# Patient Record
Sex: Male | Born: 1940 | Race: White | Hispanic: No | Marital: Married | Smoking: Former smoker
Health system: Southern US, Community
[De-identification: ages and names within clinical notes are randomized; demographics above are authoritative.]

## PROBLEM LIST (undated history)

## (undated) DIAGNOSIS — Z87442 Personal history of urinary calculi: Secondary | ICD-10-CM

## (undated) DIAGNOSIS — N210 Calculus in bladder: Secondary | ICD-10-CM

## (undated) DIAGNOSIS — E785 Hyperlipidemia, unspecified: Secondary | ICD-10-CM

## (undated) DIAGNOSIS — N400 Enlarged prostate without lower urinary tract symptoms: Secondary | ICD-10-CM

## (undated) DIAGNOSIS — R918 Other nonspecific abnormal finding of lung field: Secondary | ICD-10-CM

## (undated) DIAGNOSIS — N200 Calculus of kidney: Secondary | ICD-10-CM

## (undated) DIAGNOSIS — Z973 Presence of spectacles and contact lenses: Secondary | ICD-10-CM

## (undated) DIAGNOSIS — Z77090 Contact with and (suspected) exposure to asbestos: Secondary | ICD-10-CM

## (undated) DIAGNOSIS — K219 Gastro-esophageal reflux disease without esophagitis: Secondary | ICD-10-CM

## (undated) DIAGNOSIS — I10 Essential (primary) hypertension: Secondary | ICD-10-CM

## (undated) DIAGNOSIS — E119 Type 2 diabetes mellitus without complications: Secondary | ICD-10-CM

## (undated) DIAGNOSIS — Z972 Presence of dental prosthetic device (complete) (partial): Secondary | ICD-10-CM

## (undated) DIAGNOSIS — R350 Frequency of micturition: Secondary | ICD-10-CM

## (undated) DIAGNOSIS — M1990 Unspecified osteoarthritis, unspecified site: Secondary | ICD-10-CM

## (undated) HISTORY — PX: CATARACT EXTRACTION W/ INTRAOCULAR LENS  IMPLANT, BILATERAL: SHX1307

## (undated) HISTORY — DX: Hyperlipidemia, unspecified: E78.5

## (undated) HISTORY — PX: TOTAL HIP ARTHROPLASTY: SHX124

## (undated) HISTORY — DX: Benign prostatic hyperplasia without lower urinary tract symptoms: N40.0

## (undated) HISTORY — DX: Gastro-esophageal reflux disease without esophagitis: K21.9

---

## 1999-06-06 ENCOUNTER — Encounter: Payer: Self-pay | Admitting: Orthopedic Surgery

## 1999-06-06 ENCOUNTER — Ambulatory Visit (HOSPITAL_COMMUNITY): Admission: RE | Admit: 1999-06-06 | Discharge: 1999-06-06 | Payer: Self-pay | Admitting: Orthopedic Surgery

## 1999-08-12 ENCOUNTER — Encounter: Payer: Self-pay | Admitting: Orthopedic Surgery

## 1999-08-12 ENCOUNTER — Ambulatory Visit (HOSPITAL_COMMUNITY): Admission: RE | Admit: 1999-08-12 | Discharge: 1999-08-12 | Payer: Self-pay | Admitting: Orthopedic Surgery

## 1999-08-26 ENCOUNTER — Encounter: Payer: Self-pay | Admitting: Orthopedic Surgery

## 1999-09-01 ENCOUNTER — Inpatient Hospital Stay (HOSPITAL_COMMUNITY): Admission: RE | Admit: 1999-09-01 | Discharge: 1999-09-06 | Payer: Self-pay | Admitting: Orthopedic Surgery

## 1999-09-01 ENCOUNTER — Encounter: Payer: Self-pay | Admitting: Orthopedic Surgery

## 2002-10-30 ENCOUNTER — Ambulatory Visit (HOSPITAL_COMMUNITY): Admission: RE | Admit: 2002-10-30 | Discharge: 2002-10-30 | Payer: Self-pay | Admitting: Gastroenterology

## 2003-03-24 ENCOUNTER — Encounter: Payer: Self-pay | Admitting: Internal Medicine

## 2003-03-24 ENCOUNTER — Encounter: Admission: RE | Admit: 2003-03-24 | Discharge: 2003-03-24 | Payer: Self-pay | Admitting: Internal Medicine

## 2003-11-11 ENCOUNTER — Ambulatory Visit (HOSPITAL_COMMUNITY): Admission: RE | Admit: 2003-11-11 | Discharge: 2003-11-11 | Payer: Self-pay | Admitting: Neurology

## 2003-11-14 HISTORY — PX: WRIST SURGERY: SHX841

## 2004-03-28 ENCOUNTER — Inpatient Hospital Stay (HOSPITAL_COMMUNITY): Admission: RE | Admit: 2004-03-28 | Discharge: 2004-03-31 | Payer: Self-pay | Admitting: Orthopedic Surgery

## 2004-04-23 ENCOUNTER — Emergency Department (HOSPITAL_COMMUNITY): Admission: EM | Admit: 2004-04-23 | Discharge: 2004-04-23 | Payer: Self-pay | Admitting: Family Medicine

## 2004-04-24 ENCOUNTER — Emergency Department (HOSPITAL_COMMUNITY): Admission: EM | Admit: 2004-04-24 | Discharge: 2004-04-24 | Payer: Self-pay | Admitting: Emergency Medicine

## 2004-10-06 ENCOUNTER — Inpatient Hospital Stay (HOSPITAL_COMMUNITY): Admission: EM | Admit: 2004-10-06 | Discharge: 2004-10-08 | Payer: Self-pay | Admitting: Emergency Medicine

## 2011-09-20 ENCOUNTER — Encounter: Payer: Self-pay | Admitting: Emergency Medicine

## 2011-09-20 ENCOUNTER — Emergency Department (HOSPITAL_COMMUNITY)
Admission: EM | Admit: 2011-09-20 | Discharge: 2011-09-21 | Disposition: A | Discharge status: Home / Self Care | Payer: BC Managed Care – PPO | Attending: Emergency Medicine | Admitting: Emergency Medicine

## 2011-09-20 ENCOUNTER — Emergency Department (HOSPITAL_COMMUNITY): Payer: BC Managed Care – PPO

## 2011-09-20 ENCOUNTER — Other Ambulatory Visit: Payer: Self-pay

## 2011-09-20 DIAGNOSIS — R599 Enlarged lymph nodes, unspecified: Secondary | ICD-10-CM | POA: Insufficient documentation

## 2011-09-20 DIAGNOSIS — IMO0001 Reserved for inherently not codable concepts without codable children: Secondary | ICD-10-CM | POA: Insufficient documentation

## 2011-09-20 DIAGNOSIS — R0602 Shortness of breath: Secondary | ICD-10-CM | POA: Insufficient documentation

## 2011-09-20 DIAGNOSIS — J40 Bronchitis, not specified as acute or chronic: Secondary | ICD-10-CM

## 2011-09-20 DIAGNOSIS — R109 Unspecified abdominal pain: Secondary | ICD-10-CM | POA: Insufficient documentation

## 2011-09-20 DIAGNOSIS — M549 Dorsalgia, unspecified: Secondary | ICD-10-CM | POA: Insufficient documentation

## 2011-09-20 DIAGNOSIS — R918 Other nonspecific abnormal finding of lung field: Secondary | ICD-10-CM

## 2011-09-20 DIAGNOSIS — R590 Localized enlarged lymph nodes: Secondary | ICD-10-CM

## 2011-09-20 DIAGNOSIS — R079 Chest pain, unspecified: Secondary | ICD-10-CM | POA: Insufficient documentation

## 2011-09-20 DIAGNOSIS — M2550 Pain in unspecified joint: Secondary | ICD-10-CM | POA: Insufficient documentation

## 2011-09-20 DIAGNOSIS — R300 Dysuria: Secondary | ICD-10-CM | POA: Insufficient documentation

## 2011-09-20 DIAGNOSIS — R509 Fever, unspecified: Secondary | ICD-10-CM | POA: Insufficient documentation

## 2011-09-20 LAB — BASIC METABOLIC PANEL
BUN: 22 mg/dL (ref 6–23)
CO2: 25 mEq/L (ref 19–32)
Calcium: 9.3 mg/dL (ref 8.4–10.5)
Chloride: 101 mEq/L (ref 96–112)
Creatinine, Ser: 0.84 mg/dL (ref 0.50–1.35)
GFR calc Af Amer: >90 mL/min (ref >90)
GFR calc non Af Amer: 86 mL/min — ABNORMAL LOW (ref >90)
Glucose, Bld: 154 mg/dL — ABNORMAL HIGH (ref 70–99)
Potassium: 4.1 mEq/L (ref 3.5–5.1)
Sodium: 135 mEq/L (ref 135–145)

## 2011-09-20 LAB — CBC
HCT: 37.5 % — ABNORMAL LOW (ref 39.0–52.0)
Hemoglobin: 12 g/dL — ABNORMAL LOW (ref 13.0–17.0)
MCH: 27 pg (ref 26.0–34.0)
MCHC: 32 g/dL (ref 30.0–36.0)
MCV: 84.3 fL (ref 78.0–100.0)
Platelets: 161 10*3/uL (ref 150–400)
RBC: 4.45 MIL/uL (ref 4.22–5.81)
RDW: 13.2 % (ref 11.5–15.5)
WBC: 9.2 10*3/uL (ref 4.0–10.5)

## 2011-09-20 LAB — POCT I-STAT TROPONIN I: Troponin i, poc: 0 ng/mL (ref 0.00–0.08)

## 2011-09-20 MED ORDER — ACETAMINOPHEN 325 MG PO TABS
ORAL_TABLET | ORAL | Status: AC
  Administered 2011-09-20: 975 mg
  Filled 2011-09-20: qty 3

## 2011-09-20 NOTE — ED Notes (Signed)
PT. REPORTS GENERALIZED BODY ACHES AND PAIN WITH CHEST PAIN ONSET LAST Sunday  WITH SOB , NO COUGH  , UPPER BACK AND SHOULDER.

## 2011-09-21 ENCOUNTER — Emergency Department (HOSPITAL_COMMUNITY): Payer: BC Managed Care – PPO

## 2011-09-21 ENCOUNTER — Encounter (HOSPITAL_COMMUNITY): Payer: Self-pay | Admitting: Emergency Medicine

## 2011-09-21 LAB — URINE CULTURE
Colony Count: 5000
Culture  Setup Time: 201211080227

## 2011-09-21 LAB — URINALYSIS, ROUTINE W REFLEX MICROSCOPIC
Glucose, UA: 100 mg/dL — AB
Hgb urine dipstick: NEGATIVE
Ketones, ur: NEGATIVE mg/dL
Leukocytes, UA: NEGATIVE
Nitrite: NEGATIVE
Protein, ur: NEGATIVE mg/dL
Specific Gravity, Urine: 1.029 (ref 1.005–1.030)
Urobilinogen, UA: 1 mg/dL (ref 0.0–1.0)
pH: 6 (ref 5.0–8.0)

## 2011-09-21 LAB — LACTIC ACID, PLASMA: Lactic Acid, Venous: 1.1 mmol/L (ref 0.5–2.2)

## 2011-09-21 LAB — POCT I-STAT TROPONIN I: Troponin i, poc: 0 ng/mL (ref 0.00–0.08)

## 2011-09-21 MED ORDER — ALBUTEROL SULFATE HFA 108 (90 BASE) MCG/ACT IN AERS: 1.0000 | INHALATION_SPRAY | Freq: Four times a day (QID) | RESPIRATORY_TRACT | Status: DC | PRN

## 2011-09-21 MED ORDER — FENTANYL CITRATE 0.05 MG/ML IJ SOLN
50.0000 ug | Freq: Once | INTRAMUSCULAR | Status: AC
  Administered 2011-09-21: 50 ug via INTRAVENOUS
  Filled 2011-09-21: qty 2

## 2011-09-21 MED ORDER — KETOROLAC TROMETHAMINE 30 MG/ML IJ SOLN
30.0000 mg | Freq: Once | INTRAMUSCULAR | Status: AC
  Administered 2011-09-21: 30 mg via INTRAVENOUS
  Filled 2011-09-21: qty 1

## 2011-09-21 MED ORDER — ALBUTEROL SULFATE (5 MG/ML) 0.5% IN NEBU
5.0000 mg | INHALATION_SOLUTION | Freq: Once | RESPIRATORY_TRACT | Status: AC
  Administered 2011-09-21: 5 mg via RESPIRATORY_TRACT
  Filled 2011-09-21: qty 1

## 2011-09-21 MED ORDER — IOHEXOL 300 MG/ML  SOLN
100.0000 mL | Freq: Once | INTRAMUSCULAR | Status: AC | PRN
  Administered 2011-09-21: 100 mL via INTRAVENOUS

## 2011-09-21 MED ORDER — AMOXICILLIN-POT CLAVULANATE 875-125 MG PO TABS: 1.0000 | ORAL_TABLET | Freq: Two times a day (BID) | ORAL | Status: AC

## 2011-09-21 NOTE — ED Provider Notes (Signed)
History     CSN: [id number] Arrival date & time: 09/20/2011  8:24 PM   First MD Initiated Contact with Patient 09/21/11 0002      Chief Complaint  Patient presents with  . Chest Pain    (Consider location/radiation/quality/duration/timing/severity/associated sxs/prior treatment) Patient is a 70 y.o. male presenting with chest pain. The history is provided by the patient. No language interpreter was used.  Chest Pain The chest pain began 3 - 5 days ago. Duration of episode(s) is 4 days. Chest pain occurs constantly. The chest pain is unchanged. Associated with: nothing,  He has pain in entire body. At its most intense, the pain is at 9/10. The pain is currently at 9/10. The quality of the pain is described as sharp. Radiates to: entire body. Exacerbated by: nothinh. Primary symptoms include a fever, shortness of breath and abdominal pain. Pertinent negatives for primary symptoms include no wheezing, no palpitations, no nausea, no vomiting, no dizziness and no altered mental status. Primary symptoms comment: global weakness  Pertinent negatives for associated symptoms include no numbness.     No past medical history on file.  Past Surgical History  Procedure Date  . Hip resection arthroplasty     No family history on file.  History  Substance Use Topics  . Smoking status: Former Games developer  . Smokeless tobacco: Not on file  . Alcohol Use: No      Review of Systems  Constitutional: Positive for fever and chills. Negative for appetite change.  HENT: Negative for facial swelling and neck stiffness.   Eyes: Negative for discharge.  Respiratory: Positive for shortness of breath. Negative for wheezing.   Cardiovascular: Positive for chest pain. Negative for palpitations.  Gastrointestinal: Positive for abdominal pain. Negative for nausea and vomiting.  Genitourinary: Positive for dysuria.  Musculoskeletal: Positive for myalgias, back pain and arthralgias. Negative for joint  swelling.  Neurological: Negative for dizziness, syncope and numbness.  Hematological: Negative.  Negative for adenopathy.  Psychiatric/Behavioral: Negative.  Negative for altered mental status.    Allergies  Prednisone  Home Medications   Current Outpatient Rx  Name Route Sig Dispense Refill  . IBUPROFEN 600 MG PO TABS Oral Take 600 mg by mouth every 6 (six) hours as needed. pain       BP 134/75  Pulse 108  Temp(Src) 102.6 F (39.2 C) (Rectal)  Resp 16  SpO2 100%  Physical Exam  Constitutional: He is oriented to person, place, and time. He appears well-developed and well-nourished. No distress.  HENT:  Head: Normocephalic and atraumatic.  Right Ear: External ear normal.  Left Ear: External ear normal.  Mouth/Throat: Oropharynx is clear and moist. No oropharyngeal exudate.  Eyes: EOM are normal. Pupils are equal, round, and reactive to light. Right eye exhibits no discharge. Left eye exhibits no discharge.  Neck: Normal range of motion. Neck supple. No tracheal deviation present. No thyromegaly present.  Cardiovascular: Normal rate and regular rhythm.   No murmur heard. Pulmonary/Chest: Effort normal and breath sounds normal. No respiratory distress.  Abdominal: Soft. Bowel sounds are normal. There is no tenderness. There is no guarding.  Musculoskeletal: Normal range of motion.  Lymphadenopathy:    He has no cervical adenopathy.  Neurological: He is alert and oriented to person, place, and time.  Skin: Skin is warm and dry.  Psychiatric: He has a normal mood and affect.    ED Course  Procedures (including critical care time)  Labs Reviewed  CBC - Abnormal; Notable for the following:  Hemoglobin 12.0 (*)    HCT 37.5 (*)    All other components within normal limits  BASIC METABOLIC PANEL - Abnormal; Notable for the following:    Glucose, Bld 154 (*)    GFR calc non Af Amer 86 (*)    All other components within normal limits  POCT I-STAT TROPONIN I  POCT  CARDIAC MARKERS  URINALYSIS, ROUTINE W REFLEX MICROSCOPIC  I-STAT TROPONIN I  URINE CULTURE  URINALYSIS, ROUTINE W REFLEX MICROSCOPIC  LACTIC ACID, PLASMA   Dg Chest 2 View  09/20/2011  *RADIOLOGY REPORT*  Clinical Data: Chest pain.  CHEST - 2 VIEW  Comparison: Chest x-ray 10/06/2004.  Findings: The cardiac silhouette, mediastinal and hilar contours are within normal limits and stable.  The lungs demonstrate chronic bronchitic type changes but no acute pulmonary findings.  No pleural effusion. Pleural calcifications are noted and may be due to asbestos related pleural disease.  Moderate degenerative changes noted throughout the thoracic spine.  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  Original Report Authenticated By: P. Loralie Champagne, M.D.     No diagnosis found.    MDM   Date: 09/21/2011  Rate: 100  Rhythm: sinus tachycardia  QRS Axis: normal  Intervals: normal  ST/T Wave abnormalities: nonspecific ST changes  Conduction Disutrbances:none  Narrative Interpretation:   Old EKG Reviewed: none available       Case d/w Dr. Evlyn Kanner, who took the patients information.  Dr. Evlyn Kanner informed of CT results and will arrange close follow up in the office for further diagnosis and treatment of mediastinal lymphadenopathy pulmonary plaques and pulmonary nodules.    Patient informed of mediastinal lymphadenopathy and concern for potential lymphoma and pulmonary plaques consistent with asbestosis.  Patient states he used to work with asbestos in [employer].  Patient has follow up with Dr. Wylene Simmer at 11 am today and will inform him of findings and seek further care.  Feeling improved.    Jasmine Awe, MD 09/21/11 [phone]

## 2011-10-11 ENCOUNTER — Ambulatory Visit (INDEPENDENT_AMBULATORY_CARE_PROVIDER_SITE_OTHER): Payer: BC Managed Care – PPO | Admitting: Critical Care Medicine

## 2011-10-11 ENCOUNTER — Encounter: Payer: Self-pay | Admitting: *Deleted

## 2011-10-11 DIAGNOSIS — J189 Pneumonia, unspecified organism: Secondary | ICD-10-CM

## 2011-10-11 DIAGNOSIS — E119 Type 2 diabetes mellitus without complications: Secondary | ICD-10-CM | POA: Insufficient documentation

## 2011-10-11 DIAGNOSIS — N400 Enlarged prostate without lower urinary tract symptoms: Secondary | ICD-10-CM | POA: Insufficient documentation

## 2011-10-11 DIAGNOSIS — E669 Obesity, unspecified: Secondary | ICD-10-CM

## 2011-10-11 DIAGNOSIS — R918 Other nonspecific abnormal finding of lung field: Secondary | ICD-10-CM

## 2011-10-11 DIAGNOSIS — K219 Gastro-esophageal reflux disease without esophagitis: Secondary | ICD-10-CM

## 2011-10-11 DIAGNOSIS — E785 Hyperlipidemia, unspecified: Secondary | ICD-10-CM

## 2011-10-11 MED ORDER — AZITHROMYCIN 250 MG PO TABS: 250.0000 mg | ORAL_TABLET | Freq: Every day | ORAL | Status: AC

## 2011-10-11 NOTE — Patient Instructions (Signed)
Take azithromycin 250mg  Take two once then one daily until gone CT Chest will be obtained in two weeks then see Dr Delford Field again

## 2011-10-11 NOTE — Progress Notes (Signed)
Subjective:    Patient ID: Kevin Suarez, male    DOB: 09/25/1941, 70 y.o.   MRN: [id number]  HPI Hx of chest pain and pressure on the right side. Started suddenly on 09/20/11.  ? Flu vaccine related. Noted some fever, low grade 100.2  No real cough. Noted more dyspnea.  Hurt all over like an ache. Joints hurt.  No sinus or nasal issues then.  Rx augmentin x 7days  and Zpak. Now off > one week.   As pain in body slowly better, pain in chest was pushing, hurt through to the back.  Now pain is pressure , no change with deep breath.  No more fever.  Now is ok with dyspnea.  No real exposures in past.  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  No other exposure to asbestos since.  Worked in sheet metal in the past, ran pipe for house HVAC systems.  SMoked in the past,  Quit in 2000. No prior hx of PNA.    Past Medical History  Diagnosis Date  . Hyperlipidemia   . GERD (gastroesophageal reflux disease)   . Obesity   . BPH (benign prostatic hypertrophy)   . Diabetes mellitus      Family History  Problem Relation Age of Onset  . Cancer Mother   . Cancer Maternal Grandmother   . Heart attack Paternal Grandfather   . Coronary artery disease Father   . Diabetes Father      History   Social History  . Marital Status: Married    Spouse Name: N/A    Number of Children: N/A  . Years of Education: N/A   Occupational History  . plumber    Social History Main Topics  . Smoking status: Former Smoker -- 2.0 packs/day for 30 years    Types: Cigarettes    Quit date: 11/14/1995  . Smokeless tobacco: Not on file  . Alcohol Use: No  . Drug Use: No  . Sexually Active: Not on file   Other Topics Concern  . Not on file   Social History Narrative  . No narrative on file     Allergies  Allergen Reactions  . Prednisone     Doesn't like side effects of prednisone.      Outpatient Prescriptions Prior to Visit  Medication Sig Dispense Refill  . albuterol (PROVENTIL  HFA;VENTOLIN HFA) 108 (90 BASE) MCG/ACT inhaler Inhale 1-2 puffs into the lungs every 6 (six) hours as needed for wheezing.  1 Inhaler  0  . ibuprofen (ADVIL,MOTRIN) 600 MG tablet Take 600 mg by mouth every 6 (six) hours as needed. pain           Review of Systems  Constitutional: Negative for fever and unexpected weight change.  HENT: Negative for ear pain, nosebleeds, congestion, sore throat, rhinorrhea, sneezing, trouble swallowing, dental problem, postnasal drip and sinus pressure.   Eyes: Negative for redness and itching.  Respiratory: Positive for shortness of breath. Negative for cough, chest tightness and wheezing.   Cardiovascular: Positive for chest pain. Negative for palpitations and leg swelling.  Gastrointestinal: Negative for nausea and vomiting.  Genitourinary: Negative for dysuria.  Musculoskeletal: Negative for joint swelling.  Skin: Negative for rash.  Neurological: Positive for headaches.  Hematological: Does not bruise/bleed easily.  Psychiatric/Behavioral: Negative for dysphoric mood. The patient is not nervous/anxious.        Objective:   Physical Exam Filed Vitals:   10/11/11 1113  BP: 122/68  Pulse: 69  Temp: 98.1 F (36.7 C)  TempSrc: Oral  Height: 5\' 10"  (1.778 m)  Weight: 249 lb 6.4 oz (113.127 kg)  SpO2: 98%    Gen: Pleasant, well-nourished, in no distress,  normal affect  ENT: No lesions,  mouth clear,  oropharynx clear, no postnasal drip  Neck: No JVD, no TMG, no carotid bruits  Lungs: No use of accessory muscles, no dullness to percussion, clear without rales or rhonchi  Cardiovascular: RRR, heart sounds normal, no murmur or gallops, no peripheral edema  Abdomen: soft and NT, no HSM,  BS normal  Musculoskeletal: No deformities, no cyanosis or clubbing  Neuro: alert, non focal  Skin: Warm, no lesions or rashes    CT Chest 09/21/11: Findings: No pulmonary arterial filling defect identified. Normal  caliber aorta with scattered  atherosclerotic calcification. Normal  heart size. Coronary artery calcification. No pleural or  pericardial effusion. There are calcified pleural plaques. There  is extensive mediastinal and hilar lymphadenopathy. As index, a  prevascular node measures 1.7 cm short axis on series 2 image 73.  A right infrahilar lymph node measures 1.2 cm short axis on image  114. There is a nodule with punctate calcification measuring 1.3  cm on series 4 image 56. A 5 mm nodule on image 54 is present. A  noncalcified 11 mm nodule is present on the right on image 49.  Limited images through the upper abdomen demonstrate mildly  prominent porta hepatis lymph nodes however no acute abnormality.  Multilevel degenerative changes of the imaged spine. No acute or  aggressive appearing osseous lesion. There is compression  deformity of the T8 vertebral body that does not appear acute.  Review of the MIP images confirms the above findings.  IMPRESSION:  No pulmonary embolism or aortic dissection.  Pleural plaques suggest prior asbestos exposure. There is  extensive mediastinal lymphadenopathy. While possibly reactive,  malignancy/lymphoma are the diagnoses of exclusion and tissue  sample should be considered.  There are at least three lung nodules on the right which are  nonspecific. Attention with short-term follow-up recommended (3  months).         Assessment & Plan:   Lung nodules Multiple R lung nodules likely benign or inflammatory, doubt CA Plan Rx Azithromycin x 5days F/u CT Chest in 3 months     Updated Medication List Outpatient Encounter Prescriptions as of 10/11/2011  Medication Sig Dispense Refill  . albuterol (PROVENTIL HFA;VENTOLIN HFA) 108 (90 BASE) MCG/ACT inhaler Inhale 1-2 puffs into the lungs every 6 (six) hours as needed for wheezing.  1 Inhaler  0  . ibuprofen (ADVIL,MOTRIN) 600 MG tablet Take 600 mg by mouth every 6 (six) hours as needed. pain       . azithromycin  (ZITHROMAX) 250 MG tablet Take 1 tablet (250 mg total) by mouth daily. Take two once then one daily until gone  6 each  0

## 2011-10-12 NOTE — Assessment & Plan Note (Addendum)
Multiple R lung nodules likely benign or inflammatory, doubt CA Plan Rx Azithromycin x 5days F/u CT Chest in 3 months

## 2011-10-24 ENCOUNTER — Ambulatory Visit: Payer: BC Managed Care – PPO | Admitting: Critical Care Medicine

## 2011-10-25 ENCOUNTER — Ambulatory Visit (INDEPENDENT_AMBULATORY_CARE_PROVIDER_SITE_OTHER)
Admission: RE | Admit: 2011-10-25 | Discharge: 2011-10-25 | Disposition: A | Discharge status: Home / Self Care | Payer: BC Managed Care – PPO | Source: Ambulatory Visit | Attending: Critical Care Medicine | Admitting: Critical Care Medicine

## 2011-10-25 ENCOUNTER — Ambulatory Visit (INDEPENDENT_AMBULATORY_CARE_PROVIDER_SITE_OTHER): Payer: BC Managed Care – PPO | Admitting: Critical Care Medicine

## 2011-10-25 ENCOUNTER — Encounter: Payer: Self-pay | Admitting: Critical Care Medicine

## 2011-10-25 VITALS — BP 110/66 | HR 76 | Temp 98.0°F | Ht 70.0 in | Wt 252.0 lb

## 2011-10-25 DIAGNOSIS — R918 Other nonspecific abnormal finding of lung field: Secondary | ICD-10-CM

## 2011-10-25 DIAGNOSIS — J189 Pneumonia, unspecified organism: Secondary | ICD-10-CM

## 2011-10-25 NOTE — Assessment & Plan Note (Signed)
Benign lung nodules, no further w/u or scans needed Plan Return prn

## 2011-10-25 NOTE — Progress Notes (Signed)
Subjective:    Patient ID: Kevin Suarez, male    DOB: October 04, 1941, 70 y.o.   MRN: [id number]  HPI  Hx of chest pain and pressure on the right side. Started suddenly on 09/20/11.  ? Flu vaccine related. Noted some fever, low grade 100.2  No real cough. Noted more dyspnea.  Hurt all over like an ache. Joints hurt.  No sinus or nasal issues then.  Rx augmentin x 7days  and Zpak. Now off > one week.   As pain in body slowly better, pain in chest was pushing, hurt through to the back.  Now pain is pressure , no change with deep breath.  No more fever.  Now is ok with dyspnea.  No real exposures in past.  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  No other exposure to asbestos since.  Worked in sheet metal in the past, ran pipe for house HVAC systems.  SMoked in the past,  Quit in 2000. No prior hx of PNA.    10/25/2011  F/u chest pain syndrome and abn CT scan .  Neither related. 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    Chest pain is less.  No new issues.   Past Medical History  Diagnosis Date  . Hyperlipidemia   . GERD (gastroesophageal reflux disease)   . Obesity   . BPH (benign prostatic hypertrophy)   . Diabetes mellitus      Family History  Problem Relation Age of Onset  . Cancer Mother   . Cancer Maternal Grandmother   . Heart attack Paternal Grandfather   . Coronary artery disease Father   . Diabetes Father      History   Social History  . Marital Status: Married    Spouse Name: N/A    Number of Children: N/A  . Years of Education: N/A   Occupational History  . plumber    Social History Main Topics  . Smoking status: Former Smoker -- 2.0 packs/day for 30 years    Types: Cigarettes    Quit date: 11/14/1995  . Smokeless tobacco: Never Used  . Alcohol Use: No  . Drug Use: No  . Sexually Active: Not on file   Other Topics Concern  . Not on file   Social History Narrative  . No narrative on file     Allergies  Allergen  Reactions  . Prednisone     Doesn't like side effects of prednisone.      Outpatient Prescriptions Prior to Visit  Medication Sig Dispense Refill  . albuterol (PROVENTIL HFA;VENTOLIN HFA) 108 (90 BASE) MCG/ACT inhaler Inhale 1-2 puffs into the lungs every 6 (six) hours as needed for wheezing.  1 Inhaler  0  . ibuprofen (ADVIL,MOTRIN) 600 MG tablet Take 600 mg by mouth every 6 (six) hours as needed. pain           Review of Systems  Constitutional: Negative for fever and unexpected weight change.  HENT: Negative for ear pain, nosebleeds, congestion, sore throat, rhinorrhea, sneezing, trouble swallowing, dental problem, postnasal drip and sinus pressure.   Eyes: Negative for redness and itching.  Respiratory: Positive for shortness of breath. Negative for cough, chest tightness and wheezing.   Cardiovascular: Positive for chest pain. Negative for palpitations and leg swelling.  Gastrointestinal: Negative for nausea and vomiting.  Genitourinary: Negative for dysuria.  Musculoskeletal: Negative for joint swelling.  Skin: Negative for rash.  Neurological: Positive for headaches.  Hematological:  Does not bruise/bleed easily.  Psychiatric/Behavioral: Negative for dysphoric mood. The patient is not nervous/anxious.        Objective:   Physical Exam  Filed Vitals:   10/25/11 1528  BP: 110/66  Pulse: 76  Temp: 98 F (36.7 C)  TempSrc: Oral  Height: 5\' 10"  (1.778 m)  Weight: 252 lb (114.306 kg)  SpO2: 95%    Gen: Pleasant, well-nourished, in no distress,  normal affect  ENT: No lesions,  mouth clear,  oropharynx clear, no postnasal drip  Neck: No JVD, no TMG, no carotid bruits  Lungs: No use of accessory muscles, no dullness to percussion, clear without rales or rhonchi  Cardiovascular: RRR, heart sounds normal, no murmur or gallops, no peripheral edema  Abdomen: soft and NT, no HSM,  BS normal  Musculoskeletal: No deformities, no cyanosis or clubbing  Neuro: alert, non  focal  Skin: Warm, no lesions or rashes  Ct Chest Wo Contrast  10/25/2011  *RADIOLOGY REPORT*  Clinical Data: Follow-up pulmonary nodules.  Right chest pain radiating to the shoulder blades.  CT CHEST WITHOUT CONTRAST  Technique:  Multidetector CT imaging of the chest was performed following the standard protocol without IV contrast.  Comparison: 09/21/2011.  Findings: Mediastinal lymph nodes have decreased in size in the interval.  Index low right paratracheal lymph node measures 10 mm (previously 13 mm).  Hilar regions are difficult to definitively evaluate without IV contrast.  No axillary adenopathy.  Coronary artery calcification.  Heart size normal.  No pericardial effusion.  There are calcified pleural plaques bilaterally.  A calcified nodular plaque is seen along the right major fissure, simulating a nodule on the prior study.  There are a few scattered tiny pulmonary nodules, measuring 4 mm less in size.  No pleural fluid. Airway is unremarkable.  Incidental imaging of the upper abdomen shows no acute findings. Degenerative changes are seen in the spine.  No worrisome lytic or sclerotic lesions.  Mild anterior wedging of a mid thoracic vertebral body is unchanged.  IMPRESSION:  1.  Scattered tiny pulmonary nodules measure 4 mm less in size. If the patient is at high risk for bronchogenic carcinoma, follow-up chest CT at 1 year is recommended.  If the patient is at low risk, no follow-up is needed.  This recommendation follows the consensus statement: Guidelines for Management of Small Pulmonary Nodules Detected on CT Scans:  A Statement from the Fleischner Society as published in Radiology 2005; 237:395-400.  Available online at: DietDisorder.cz. 2.  Asbestos-related pleural disease.  Original Report Authenticated By: Reyes Ivan, M.D.       Assessment & Plan:   Lung nodules Benign lung nodules, no further w/u or scans needed Plan Return  prn    Lung nodule benign Suspect prior asbestos exposure with pleural calcified plaques Mediastinal LAN improved   Updated Medication List Outpatient Encounter Prescriptions as of 10/25/2011  Medication Sig Dispense Refill  . albuterol (PROVENTIL HFA;VENTOLIN HFA) 108 (90 BASE) MCG/ACT inhaler Inhale 1-2 puffs into the lungs every 6 (six) hours as needed for wheezing.  1 Inhaler  0  . ibuprofen (ADVIL,MOTRIN) 600 MG tablet Take 600 mg by mouth every 6 (six) hours as needed. pain

## 2011-10-25 NOTE — Patient Instructions (Signed)
No change in medications. Return as needed 

## 2011-12-28 ENCOUNTER — Other Ambulatory Visit: Payer: Self-pay | Admitting: Cardiology

## 2011-12-28 DIAGNOSIS — I739 Peripheral vascular disease, unspecified: Secondary | ICD-10-CM

## 2012-01-02 ENCOUNTER — Encounter (INDEPENDENT_AMBULATORY_CARE_PROVIDER_SITE_OTHER): Payer: BC Managed Care – PPO

## 2012-01-02 DIAGNOSIS — I739 Peripheral vascular disease, unspecified: Secondary | ICD-10-CM

## 2012-01-02 DIAGNOSIS — E1159 Type 2 diabetes mellitus with other circulatory complications: Secondary | ICD-10-CM

## 2013-11-24 ENCOUNTER — Other Ambulatory Visit: Payer: Self-pay | Admitting: Urology

## 2013-11-24 ENCOUNTER — Encounter (HOSPITAL_BASED_OUTPATIENT_CLINIC_OR_DEPARTMENT_OTHER): Payer: Self-pay | Admitting: *Deleted

## 2013-11-24 NOTE — Progress Notes (Signed)
NPO AFTER MN. ARRIVE AT 0715. NEEDS ISTAT AND EKG. WILL TAKE LOSARTAN AM DOS W/ SIP OF WATER.

## 2013-11-28 ENCOUNTER — Encounter (HOSPITAL_BASED_OUTPATIENT_CLINIC_OR_DEPARTMENT_OTHER): Payer: Self-pay | Admitting: Anesthesiology

## 2013-11-28 ENCOUNTER — Encounter (HOSPITAL_BASED_OUTPATIENT_CLINIC_OR_DEPARTMENT_OTHER)
Admission: RE | Disposition: A | Discharge status: Home / Self Care | Payer: Self-pay | Source: Ambulatory Visit | Attending: Urology

## 2013-11-28 ENCOUNTER — Encounter (HOSPITAL_BASED_OUTPATIENT_CLINIC_OR_DEPARTMENT_OTHER): Payer: Medicare Other | Admitting: Anesthesiology

## 2013-11-28 ENCOUNTER — Ambulatory Visit (HOSPITAL_BASED_OUTPATIENT_CLINIC_OR_DEPARTMENT_OTHER)
Admission: RE | Admit: 2013-11-28 | Discharge: 2013-11-28 | Disposition: A | Discharge status: Home / Self Care | Payer: Medicare Other | Source: Ambulatory Visit | Attending: Urology | Admitting: Urology

## 2013-11-28 ENCOUNTER — Ambulatory Visit (HOSPITAL_BASED_OUTPATIENT_CLINIC_OR_DEPARTMENT_OTHER): Payer: Medicare Other | Admitting: Anesthesiology

## 2013-11-28 DIAGNOSIS — Z87442 Personal history of urinary calculi: Secondary | ICD-10-CM | POA: Insufficient documentation

## 2013-11-28 DIAGNOSIS — R310 Gross hematuria: Secondary | ICD-10-CM | POA: Insufficient documentation

## 2013-11-28 DIAGNOSIS — N401 Enlarged prostate with lower urinary tract symptoms: Secondary | ICD-10-CM | POA: Insufficient documentation

## 2013-11-28 DIAGNOSIS — N400 Enlarged prostate without lower urinary tract symptoms: Secondary | ICD-10-CM

## 2013-11-28 DIAGNOSIS — Z8701 Personal history of pneumonia (recurrent): Secondary | ICD-10-CM | POA: Insufficient documentation

## 2013-11-28 DIAGNOSIS — Z96649 Presence of unspecified artificial hip joint: Secondary | ICD-10-CM | POA: Insufficient documentation

## 2013-11-28 DIAGNOSIS — N210 Calculus in bladder: Secondary | ICD-10-CM | POA: Insufficient documentation

## 2013-11-28 DIAGNOSIS — N138 Other obstructive and reflux uropathy: Secondary | ICD-10-CM | POA: Insufficient documentation

## 2013-11-28 DIAGNOSIS — N320 Bladder-neck obstruction: Secondary | ICD-10-CM | POA: Insufficient documentation

## 2013-11-28 DIAGNOSIS — R972 Elevated prostate specific antigen [PSA]: Secondary | ICD-10-CM | POA: Insufficient documentation

## 2013-11-28 DIAGNOSIS — Z79899 Other long term (current) drug therapy: Secondary | ICD-10-CM | POA: Insufficient documentation

## 2013-11-28 DIAGNOSIS — Z7982 Long term (current) use of aspirin: Secondary | ICD-10-CM | POA: Insufficient documentation

## 2013-11-28 HISTORY — DX: Calculus in bladder: N21.0

## 2013-11-28 HISTORY — PX: TRANSURETHRAL RESECTION OF PROSTATE: SHX73

## 2013-11-28 HISTORY — PX: CYSTOSCOPY WITH LITHOLAPAXY: SHX1425

## 2013-11-28 HISTORY — DX: Frequency of micturition: R35.0

## 2013-11-28 HISTORY — DX: Type 2 diabetes mellitus without complications: E11.9

## 2013-11-28 HISTORY — DX: Other nonspecific abnormal finding of lung field: R91.8

## 2013-11-28 HISTORY — DX: Essential (primary) hypertension: I10

## 2013-11-28 HISTORY — DX: Personal history of urinary calculi: Z87.442

## 2013-11-28 HISTORY — DX: Contact with and (suspected) exposure to asbestos: Z77.090

## 2013-11-28 LAB — POCT I-STAT 4, (NA,K, GLUC, HGB,HCT)
Glucose, Bld: 155 mg/dL — ABNORMAL HIGH (ref 70–99)
HCT: 42 % (ref 39.0–52.0)
Hemoglobin: 14.3 g/dL (ref 13.0–17.0)
Potassium: 4.1 mEq/L (ref 3.7–5.3)
Sodium: 141 mEq/L (ref 137–147)

## 2013-11-28 LAB — GLUCOSE, CAPILLARY: Glucose-Capillary: 145 mg/dL — ABNORMAL HIGH (ref 70–99)

## 2013-11-28 SURGERY — CYSTOSCOPY, WITH BLADDER CALCULUS LITHOLAPAXY
Anesthesia: General | Site: Bladder

## 2013-11-28 MED ORDER — PHENAZOPYRIDINE HCL 200 MG PO TABS: 200.0000 mg | ORAL_TABLET | Freq: Three times a day (TID) | ORAL | Status: DC | PRN

## 2013-11-28 MED ORDER — PROPOFOL 10 MG/ML IV BOLUS
INTRAVENOUS | Status: DC | PRN
  Administered 2013-11-28: 200 mg via INTRAVENOUS

## 2013-11-28 MED ORDER — LACTATED RINGERS IV SOLN
INTRAVENOUS | Status: DC
  Administered 2013-11-28: 11:00:00 via INTRAVENOUS
  Filled 2013-11-28: qty 1000

## 2013-11-28 MED ORDER — FENTANYL CITRATE 0.05 MG/ML IJ SOLN
INTRAMUSCULAR | Status: AC
  Filled 2013-11-28: qty 4

## 2013-11-28 MED ORDER — PHENAZOPYRIDINE HCL 100 MG PO TABS
ORAL_TABLET | ORAL | Status: AC
  Filled 2013-11-28: qty 2

## 2013-11-28 MED ORDER — MORPHINE SULFATE 2 MG/ML IJ SOLN
1.0000 mg | INTRAMUSCULAR | Status: DC | PRN
  Filled 2013-11-28: qty 1

## 2013-11-28 MED ORDER — PROMETHAZINE HCL 25 MG/ML IJ SOLN
6.2500 mg | INTRAMUSCULAR | Status: DC | PRN
  Filled 2013-11-28: qty 1

## 2013-11-28 MED ORDER — SODIUM CHLORIDE 0.9 % IR SOLN
Status: DC | PRN
  Administered 2013-11-28: 8000 mL

## 2013-11-28 MED ORDER — TAMSULOSIN HCL 0.4 MG PO CAPS
0.4000 mg | ORAL_CAPSULE | Freq: Once | ORAL | Status: AC
  Administered 2013-11-28: 0.4 mg via ORAL
  Filled 2013-11-28 (×2): qty 1

## 2013-11-28 MED ORDER — LIDOCAINE HCL (CARDIAC) 20 MG/ML IV SOLN
INTRAVENOUS | Status: DC | PRN
  Administered 2013-11-28: 70 mg via INTRAVENOUS

## 2013-11-28 MED ORDER — METOCLOPRAMIDE HCL 5 MG/ML IJ SOLN
INTRAMUSCULAR | Status: DC | PRN
  Administered 2013-11-28: 10 mg via INTRAVENOUS

## 2013-11-28 MED ORDER — HYDROCODONE-ACETAMINOPHEN 7.5-325 MG PO TABS: 1.0000 | ORAL_TABLET | ORAL | Status: DC | PRN

## 2013-11-28 MED ORDER — ONDANSETRON HCL 4 MG/2ML IJ SOLN
INTRAMUSCULAR | Status: DC | PRN
  Administered 2013-11-28: 4 mg via INTRAVENOUS

## 2013-11-28 MED ORDER — CEFAZOLIN SODIUM 1-5 GM-% IV SOLN
1.0000 g | INTRAVENOUS | Status: DC
  Filled 2013-11-28: qty 50

## 2013-11-28 MED ORDER — MIDAZOLAM HCL 2 MG/2ML IJ SOLN
INTRAMUSCULAR | Status: AC
  Filled 2013-11-28: qty 2

## 2013-11-28 MED ORDER — PHENAZOPYRIDINE HCL 200 MG PO TABS
200.0000 mg | ORAL_TABLET | Freq: Three times a day (TID) | ORAL | Status: DC
  Administered 2013-11-28: 200 mg via ORAL
  Filled 2013-11-28: qty 1

## 2013-11-28 MED ORDER — HYDROCODONE-ACETAMINOPHEN 5-325 MG PO TABS
ORAL_TABLET | ORAL | Status: AC
  Filled 2013-11-28: qty 1

## 2013-11-28 MED ORDER — FENTANYL CITRATE 0.05 MG/ML IJ SOLN
INTRAMUSCULAR | Status: DC | PRN
  Administered 2013-11-28 (×2): 50 ug via INTRAVENOUS

## 2013-11-28 MED ORDER — LACTATED RINGERS IV SOLN
INTRAVENOUS | Status: DC
  Administered 2013-11-28 (×2): via INTRAVENOUS
  Filled 2013-11-28: qty 1000

## 2013-11-28 MED ORDER — CEFAZOLIN SODIUM-DEXTROSE 2-3 GM-% IV SOLR
2.0000 g | INTRAVENOUS | Status: AC
  Administered 2013-11-28: 2 g via INTRAVENOUS
  Filled 2013-11-28: qty 50

## 2013-11-28 MED ORDER — HYDROCODONE-ACETAMINOPHEN 5-325 MG PO TABS
1.0000 | ORAL_TABLET | ORAL | Status: DC | PRN
  Administered 2013-11-28: 1 via ORAL
  Filled 2013-11-28: qty 1

## 2013-11-28 SURGICAL SUPPLY — 48 items
ADAPTER CATH URET PLST 4-6FR (CATHETERS) IMPLANT
ADPR CATH URET STRL DISP 4-6FR (CATHETERS)
BAG DRAIN URO-CYSTO SKYTR STRL (DRAIN) ×4 IMPLANT
BAG DRN UROCATH (DRAIN) ×2
BASKET LASER NITINOL 1.9FR (BASKET) IMPLANT
BASKET STNLS GEMINI 4WIRE 3FR (BASKET) IMPLANT
BASKET ZERO TIP NITINOL 2.4FR (BASKET) IMPLANT
BRUSH URET BIOPSY 3F (UROLOGICAL SUPPLIES) IMPLANT
BSKT STON RTRVL 120 1.9FR (BASKET)
BSKT STON RTRVL GEM 120X11 3FR (BASKET)
BSKT STON RTRVL ZERO TP 2.4FR (BASKET)
CANISTER SUCT LVC 12 LTR MEDI- (MISCELLANEOUS) ×4 IMPLANT
CATH INTERMIT  6FR 70CM (CATHETERS) IMPLANT
CATH URET 5FR 28IN CONE TIP (BALLOONS)
CATH URET 5FR 70CM CONE TIP (BALLOONS) IMPLANT
CLOTH BEACON ORANGE TIMEOUT ST (SAFETY) ×4 IMPLANT
DRAPE CAMERA CLOSED 9X96 (DRAPES) ×4 IMPLANT
ELECT REM PT RETURN 9FT ADLT (ELECTROSURGICAL)
ELECT RESECT VAPORIZE 12D CBL (ELECTRODE) ×4 IMPLANT
ELECTRODE REM PT RTRN 9FT ADLT (ELECTROSURGICAL) IMPLANT
EVACUATOR MICROVAS BLADDER (UROLOGICAL SUPPLIES) ×4 IMPLANT
FIBER LASER FLEXIVA 200 (UROLOGICAL SUPPLIES) IMPLANT
FIBER LASER FLEXIVA 365 (UROLOGICAL SUPPLIES) IMPLANT
FIBER LASER FLEXIVA 550 (UROLOGICAL SUPPLIES) IMPLANT
GLOVE BIO SURGEON STRL SZ 6.5 (GLOVE) ×4 IMPLANT
GLOVE BIO SURGEON STRL SZ8 (GLOVE) ×4 IMPLANT
GLOVE BIOGEL PI IND STRL 6.5 (GLOVE) ×6 IMPLANT
GLOVE BIOGEL PI INDICATOR 6.5 (GLOVE) ×2
GOWN PREVENTION PLUS LG XLONG (DISPOSABLE) IMPLANT
GOWN STRL REIN XL XLG (GOWN DISPOSABLE) IMPLANT
GOWN STRL REUS W/TWL LRG LVL3 (GOWN DISPOSABLE) ×4 IMPLANT
GOWN STRL REUS W/TWL XL LVL3 (GOWN DISPOSABLE) ×4 IMPLANT
GOWN XL W/COTTON TOWEL STD (GOWNS) ×4 IMPLANT
GUIDEWIRE 0.038 PTFE COATED (WIRE) ×4 IMPLANT
GUIDEWIRE ANG ZIPWIRE 038X150 (WIRE) IMPLANT
GUIDEWIRE STR DUAL SENSOR (WIRE) ×4 IMPLANT
IV NS 1000ML (IV SOLUTION) ×16
IV NS 1000ML BAXH (IV SOLUTION) ×24 IMPLANT
IV NS IRRIG 3000ML ARTHROMATIC (IV SOLUTION) IMPLANT
KIT BALLIN UROMAX 15FX10 (LABEL) IMPLANT
KIT BALLN UROMAX 15FX4 (MISCELLANEOUS) IMPLANT
KIT BALLN UROMAX 26 75X4 (MISCELLANEOUS)
NS IRRIG 500ML POUR BTL (IV SOLUTION) IMPLANT
PACK CYSTOSCOPY (CUSTOM PROCEDURE TRAY) ×4 IMPLANT
SET HIGH PRES BAL DIL (LABEL)
SHEATH URET ACCESS 12FR/35CM (UROLOGICAL SUPPLIES) IMPLANT
SHEATH URET ACCESS 12FR/55CM (UROLOGICAL SUPPLIES) IMPLANT
WATER STERILE IRR 3000ML UROMA (IV SOLUTION) IMPLANT

## 2013-11-28 NOTE — H&P (Signed)
Reason For Visit Kevin Suarez was seen with complaints of dysuria and inability to void to completion   History of Present Illness        Kevin Suarez has the following urologic history:    BPH with bladder outlet obstruction: He initially tried Rapaflo without improvement of his voiding symptoms. He has also tried anticholinergics (VESIcare and Sanctura) in the past but this caused constipation and dry mouth. .      Elevated PSA: He has had PSA elevation in the past with continued observation. His PSA then fell on more than one occasion. He has always maintained a very good free to total ratio. His PSA on 12/20/12 was 5.10 with a 33% free.     Calculus disease: He developed right flank pain radiating to right lower quadrant and was found to have a 6 mm mid ureteral stone on the right-hand side by CT scan on 06/11/12. At that time he was also noted to have a right renal calculus in the midpole measuring 4.8 mm. No left renal calculi were noted. He passed his stone on 06/17/12.    Gross hematuria: He presented Monday with this. It was associated with dysuria. Per CT he was found to have a single non obstructing stone within each kidney as well as a large bladder stone. He is scheduled for cystolitholapaxy on Friday. Urine culture was negative.    Interval history: Kevin Suarez presents today with ongoing dysuria. He reports having no further hematuria. Uribel makes the dysuria slightly better. Nothing makes it worse. He is getting to the point that it is so painful to void he only voids very small amounts quite frequently through out the day. This kept him up all night. He knows he is not emptying because it hurts too much to void long enough to empty. He denies n/v or f/c. PVR is 680 cc.   Past Medical History Problems  1. History of kidney stones (V13.01) 2. History of Pneumonia (V12.61)  Surgical History Problems  1. History of Hand Surgery 2. History of Total Hip Replacement 3. History  of Wrist Surgery  Current Meds 1. Aspirin 81 MG Oral Tablet;  Therapy: (Recorded:09Mar2010) to Recorded 2. Losartan Potassium 50 MG Oral Tablet;  Therapy: (Recorded:12Jan2015) to Recorded 3. MetFORMIN HCl - 500 MG Oral Tablet;  Therapy: (Recorded:12Jan2015) to Recorded 4. Oxycodone-Acetaminophen 5-325 MG Oral Tablet; TAKE 1 TO 2 TABLETS EVERY 4 TO 6  HOURS AS NEEDED FOR PAIN;  Therapy: [id number] to (Evaluate:14Jan2015); Last Rx:12Jan2015 Ordered 5. Tamsulosin HCl - 0.4 MG Oral Capsule; Take one tablet daily at bedtime;  Therapy: 21Feb2013 to (Evaluate:05Feb2015)  Requested for: [id number]; Last  Rx:10Feb2014 Ordered  Allergies Medication  1. Flomax CP24 2. Levaquin TABS  Family History Problems  1. Family history of Family Health Status Number Of Children   1 son; 2 daughters 2. Family history of Father Deceased At Age ____   Died from a valve defect in the heart. 3. Family history of Heart Disease (V17.49) : Father 4. Family history of Mother Deceased At Age ____   Died at 69 from cancer, pt cant recall.  Social History Problems  1. Denied: History of Alcohol Use 2. Caffeine Use   5 per day 3. Current every day smoker (305.1)   quit in 2004 4. Denied: History of Drug Use 5. Marital History - Currently Married 6. Occupation:   retired 18. Denied: History of Tobacco Use  Review of Systems Genitourinary, constitutional and gastrointestinal system(s) were reviewed and  pertinent findings if present are noted.  Genitourinary: urinary frequency, dysuria and incomplete emptying of bladder.    Vitals Blood Pressure: 144 / 75 Temperature: 97.8 F Heart Rate: 82  Physical Exam Constitutional: Well nourished and well developed . No acute distress.  ENT:. The ears and nose are normal in appearance.  Neck: The appearance of the neck is normal and no neck mass is present.  Pulmonary: No respiratory distress and normal respiratory rhythm and effort.  Cardiovascular:  Heart rate and rhythm are normal . No peripheral edema.  Abdomen: The abdomen is soft and nontender. No masses are palpated. No CVA tenderness. No hernias are palpable. No hepatosplenomegaly noted.  Genitourinary: Examination of the penis is normal, with no discharge, no masses and no lesions. The penis is circumcised. The scrotum is without lesions. The right epididymis is palpably normal and non-tender. The left epididymis is palpably normal and non-tender. The right testis is non-tender and without masses. The left testis is non-tender and without masses.  Lymphatics: The femoral and inguinal nodes are not enlarged or tender.  Skin: Normal skin turgor, no visible rash and no visible skin lesions.  Neuro/Psych:. Mood and affect are appropriate.    Results/Data  PVR: Ultrasound PVR 680 ml. Cathed PVR 800 ml.    Procedure Using sterile technique, a 20 fr foley was inserted into Kevin Suarez's bladder. 10 cc of sterile water was used to inflate the balloon. 800 cc of urine drained from his bladder. The urine appeared dark but was without blood or clots. Kevin Suarez tolerated this quite well.   Assessment  I told Kevin Suarez that unfortunately until the stone is removed he will likely have pain with urination. I do not want him to continue with the high residual that is in his bladder. I offered him catheterization and he desperately agreed to this. He felt immensely better after this. He now will not have to worry about pain with urination since the catheter will drain his bladder for him. This will be removed prior to surgery and likely not replaced.   Plan Cystolitholapaxy

## 2013-11-28 NOTE — Op Note (Signed)
PATIENT:  Kevin Suarez  PRE-OPERATIVE DIAGNOSIS: 1. Bladder calculus 2. BPH with outlet obstruction  POST-OPERATIVE DIAGNOSIS: Same  PROCEDURE: 1. Cystoscopy with removal of bladder stone. 2. Transurethral incision of the prostate  SURGEON:  Claybon Jabs  INDICATION: [name] is a 73 year old male who recently was seen for dysuria and hematuria. He was found to have a bladder stone. He was experiencing gross hematuria and significant frequency. He was found to have an elevated PVR of 159 cc and eventually had a Foley catheter placed. A urine culture was found to be negative. We discussed cystolitholapaxy and possible transurethral incision of his prostate. I discussed that with him in the preop holding area and we went over that portion of the procedure in detail. I told him that I would make that determination at the time of his surgery. He was in agreement with that.  ANESTHESIA:  General  EBL:  Minimal  DRAINS: None  LOCAL MEDICATIONS USED:  None  SPECIMEN:   Stone given the patient  Description of procedure: After informed consent the patient was taken to the operating room and placed on the table in a supine position. General anesthesia was then administered. Once fully anesthetized the patient was moved to the dorsal lithotomy position and the genitalia were sterilely prepped and draped in standard fashion. An official timeout was then performed.  Initially the 49 French cystoscope with 12 lens was passed under direct vision down the urethra which is noted be entirely normal. The prostatic urethra revealed some slight elongation and trilobar hypertrophy with a very high bladder neck/median lobe component. The bladder was then entered and fully and systematically inspected. There were no tumors or inflammatory lesions. The ureteral orifices were noted to be of normal configuration and position and well away from the bladder neck. The stone was seen on the floor of the  bladder. The bladder had 3+ trabeculation with cellule formation. I filled the bladder under anesthesia although the patient did not seem to be particularly relaxed. I found his bladder only held approximately 100 cc at that time.  I got ready to use the laser to break the stone and as I irrigated the bladder the stone came out through the cystoscope. It was oblong in shape and it was able to pass through the scope. I therefore removed the cystoscope and because of his high bladder neck it appeared to be obstructing I elected to proceed with a transurethral incision of his prostate.  I replaced the cystoscope with a 26 French resectoscope sheath with visual obturator and then inserted the 12 lens with The Gyrus button. I vaporized the median lobe/bladder neck down to bladder neck fibers and then incised the prostate back to the level of the veru. Photographs before the procedure and after the procedure were obtained. Bleeding points were cauterized with point cautery and at the end of the procedure there was no active bleeding. Reinspection revealed the ureteral orifices were intact and well away from the area of vaporization and I therefore rechecked the bladder capacity under anesthesia and again found it to be low but improved at 275 cc. The bladder was then drained, the patient awakened and taken to recovery room in stable and satisfactory condition. He tolerated the procedure well with no intraoperative complications.  PLAN OF CARE: Discharge to home after PACU  PATIENT DISPOSITION:  PACU - hemodynamically stable.

## 2013-11-28 NOTE — Anesthesia Preprocedure Evaluation (Addendum)
Anesthesia Evaluation  Patient identified by MRN, date of birth, ID band Patient awake    Reviewed: Allergy & Precautions, H&P , NPO status , Patient's Chart, lab work & pertinent test results  Airway Mallampati: III TM Distance: >3 FB Neck ROM: Full    Dental  (+) Edentulous Upper, Partial Lower and Dental Advisory Given   Pulmonary former smoker,  breath sounds clear to auscultation  Pulmonary exam normal       Cardiovascular hypertension, Pt. on medications Rhythm:Regular Rate:Normal     Neuro/Psych negative neurological ROS  negative psych ROS   GI/Hepatic Neg liver ROS, GERD-  ,  Endo/Other  diabetes, Type 2, Oral Hypoglycemic Agents  Renal/GU negative Renal ROS  negative genitourinary   Musculoskeletal negative musculoskeletal ROS (+)   Abdominal   Peds  Hematology negative hematology ROS (+)   Anesthesia Other Findings   Reproductive/Obstetrics                          Anesthesia Physical Anesthesia Plan  ASA: III  Anesthesia Plan: General   Post-op Pain Management:    Induction: Intravenous  Airway Management Planned: LMA  Additional Equipment:   Intra-op Plan:   Post-operative Plan: Extubation in OR  Informed Consent: I have reviewed the patients History and Physical, chart, labs and discussed the procedure including the risks, benefits and alternatives for the proposed anesthesia with the patient or authorized representative who has indicated his/her understanding and acceptance.   Dental advisory given  Plan Discussed with: CRNA  Anesthesia Plan Comments:         Anesthesia Quick Evaluation

## 2013-11-28 NOTE — Discharge Instructions (Signed)
Cystoscopy patient instructions  Following a cystoscopy, a catheter (a flexible rubber tube) is sometimes left in place to empty the bladder. This may cause some discomfort or a feeling that you need to urinate. Your doctor determines the period of time that the catheter will be left in place. You may have bloody urine for two to three days (Call your doctor if the amount of bleeding increases or does not subside).  You may pass blood clots in your urine, especially if you had a biopsy. It is not unusual to pass small blood clots and have some bloody urine a couple of weeks after your cystoscopy. Again, call your doctor if the bleeding does not subside. You may have: Dysuria (painful urination) Frequency (urinating often) Urgency (strong desire to urinate)  These symptoms are common especially if medicine is instilled into the bladder or a ureteral stent is placed. Avoiding alcohol and caffeine, such as coffee, tea, and chocolate, may help relieve these symptoms. Drink plenty of water, unless otherwise instructed. Your doctor may also prescribe an antibiotic or other medicine to reduce these symptoms.  Cystoscopy results are available soon after the procedure; biopsy results usually take two to four days. Your doctor will discuss the results of your exam with you. Before you go home, you will be given specific instructions for follow-up care. Special Instructions:  1 If you are going home with a catheter in place do not take a tub bath until removed by your doctor.  2 You may resume your normal activities.  3 Do not drive or operate machinery if you are taking narcotic pain medicine.  4 Be sure to keep all follow-up appointments with your doctor.   5 Call Your Doctor If: The catheter is not draining  You have severe pain  You are unable to urinate  You have a fever over 101  You have severe bleeding         Post Anesthesia Home Care Instructions  Activity: Get plenty of rest for the  remainder of the day. A responsible adult should stay with you for 24 hours following the procedure.  For the next 24 hours, DO NOT: -Drive a car -Paediatric nurse -Drink alcoholic beverages -Take any medication unless instructed by your physician -Make any legal decisions or sign important papers.  Meals: Start with liquid foods such as gelatin or soup. Progress to regular foods as tolerated. Avoid greasy, spicy, heavy foods. If nausea and/or vomiting occur, drink only clear liquids until the nausea and/or vomiting subsides. Call your physician if vomiting continues.  Special Instructions/Symptoms: Your throat may feel dry or sore from the anesthesia or the breathing tube placed in your throat during surgery. If this causes discomfort, gargle with warm salt water. The discomfort should disappear within 24 hours.  Transurethral Resection of the Prostate Care After Refer to this sheet in the next few weeks. These instructions provide you with information on caring for yourself after your procedure. Your caregiver also may give you specific instructions. Your treatment has been planned according to current medical practices, but complications sometimes occur. Call your caregiver if you have any problems or questions after your procedure. HOME CARE INSTRUCTIONS  Recovery can take 4 6 weeks. Avoid alcohol, caffeinated drinks, and spicy foods for 2 weeks after your procedure. Drink enough fluids to keep your urine clear or pale yellow. Urinate as soon as you feel the urge to do so. Do not try to hold your urine for long periods of time. During  recovery you may experience pain caused by bladder spasms, which result in a very intense urge to urinate. Take all medicines as directed by your caregiver, including medicines for pain. Try to limit the amount of pain medicines you take because it can cause constipation. If you do become constipated, do not strain to move your bowels. Straining can increase  bleeding. Constipation can be minimized by increasing the amount fluids and fiber in your diet. Your caregiver also may prescribe a stool softener. Do not lift heavy objects (more than 5 lb [2.25 kg]) or perform exercises that cause you to strain for at least 1 month after your procedure. When sitting, you may want to sit in a soft chair or use a cushion. For the first 10 days after your procedure, avoid the following activities:  Running.  Strenuous work.  Long walks.  Riding in a car for extended periods.  Sex. SEEK MEDICAL CARE IF:  You have difficulty urinating.  You have blood in your urine that does not go away after you rest or increase your fluid intake.  You have swelling in your penis or scrotum. SEEK IMMEDIATE MEDICAL CARE IF:   You are suddenly unable to urinate.  You notice blood clots in your urine.  You have chills.  You have a fever.  You have pain in your back or lower abdomen.  You have pain or swelling in your legs. MAKE SURE YOU:   Understand these instructions.  Will watch your condition.  Will get help right away if you are not doing well or get worse. Document Released: 10/30/2005 Document Revised: 07/24/2012 Document Reviewed: 12/08/2011 [facility] Patient Information 2014 [place].

## 2013-11-28 NOTE — Progress Notes (Signed)
Paged Dr. Karsten Ro, reported attempts to void and bladder scan amount, see orders.

## 2013-11-28 NOTE — Anesthesia Procedure Notes (Signed)
Procedure Name: LMA Insertion Date/Time: 11/28/2013 8:30 AM Performed by: Mechele Claude Pre-anesthesia Checklist: Patient identified, Emergency Drugs available, Suction available and Patient being monitored Patient Re-evaluated:Patient Re-evaluated prior to inductionOxygen Delivery Method: Circle System Utilized Preoxygenation: Pre-oxygenation with 100% oxygen Intubation Type: IV induction Ventilation: Mask ventilation without difficulty LMA: LMA inserted LMA Size: 5.0 Number of attempts: 1 Airway Equipment and Method: bite block Placement Confirmation: positive ETCO2 Tube secured with: Tape Dental Injury: Teeth and Oropharynx as per pre-operative assessment

## 2013-11-28 NOTE — Progress Notes (Signed)
Complaining of voiding small amounts , #16 FR foley catheter inserted using sterile technique with immediate return of 425 ml of orange urine.

## 2013-11-28 NOTE — Transfer of Care (Signed)
Immediate Anesthesia Transfer of Care Note  Patient: Kevin Suarez  Procedure(s) Performed: Procedure(s) (LRB): CYSTOSCOPY WITH stone retrieval (N/A) TRANSURETHRAL RESECTION OF THE PROSTATE WITH  BUTTON GYRUS INSTRUMENTS  Patient Location: PACU  Anesthesia Type: General  Level of Consciousness: awake, alert  and oriented  Airway & Oxygen Therapy: Patient Spontanous Breathing and Patient connected to face mask oxygen  Post-op Assessment: Report given to PACU RN and Post -op Vital signs reviewed and stable  Post vital signs: Reviewed and stable  Complications: No apparent anesthesia complications

## 2013-11-28 NOTE — Anesthesia Postprocedure Evaluation (Signed)
Anesthesia Post Note  Patient: Kevin Suarez  Procedure(s) Performed: Procedure(s) (LRB): CYSTOSCOPY WITH stone retrieval (N/A) TRANSURETHRAL RESECTION OF THE PROSTATE WITH  BUTTON GYRUS INSTRUMENTS  Anesthesia type: General  Patient location: PACU  Post pain: Pain level controlled  Post assessment: Post-op Vital signs reviewed  Last Vitals:  Filed Vitals:   11/28/13 1000  BP: 126/66  Pulse: 76  Temp:   Resp: 21    Post vital signs: Reviewed  Level of consciousness: sedated  Complications: No apparent anesthesia complications

## 2013-12-01 ENCOUNTER — Encounter (HOSPITAL_BASED_OUTPATIENT_CLINIC_OR_DEPARTMENT_OTHER): Payer: Self-pay | Admitting: Urology

## 2014-01-27 ENCOUNTER — Encounter: Payer: Medicare Other | Attending: Internal Medicine

## 2014-01-27 VITALS — Ht 70.0 in | Wt 240.3 lb

## 2014-01-27 DIAGNOSIS — Z713 Dietary counseling and surveillance: Secondary | ICD-10-CM | POA: Insufficient documentation

## 2014-01-27 DIAGNOSIS — E119 Type 2 diabetes mellitus without complications: Secondary | ICD-10-CM | POA: Insufficient documentation

## 2014-01-27 NOTE — Progress Notes (Signed)
Patient was seen on 01/27/14 for the first of a series of three diabetes self-management courses at the [facility].  Current HbA1c: 7.9%  The following learning objectives were met by the patient during this class:  Describe diabetes  State some common risk factors for diabetes  Defines the role of glucose and insulin  Identifies type of diabetes and pathophysiology  Describe the relationship between diabetes and cardiovascular risk  State the members of the Healthcare Team  States the rationale for glucose monitoring  State when to test glucose  State their individual Target Range  State the importance of logging glucose readings  Describe how to interpret glucose readings  Identifies A1C target  Explain the correlation between A1c and eAG values  State symptoms and treatment of high blood glucose  State symptoms and treatment of low blood glucose  Explain proper technique for glucose testing  Identifies proper sharps disposal  Handouts given during class include:  Living Well with Diabetes book  Carb Counting and Meal Planning book  Meal Plan Card  Carbohydrate guide  Meal planning worksheet  Low Sodium Flavoring Tips  The diabetes portion plate  D3H to eAG Conversion Chart  Diabetes Medications  Diabetes Recommended Care Schedule  Support Group  Diabetes Success Plan  Core Class Satisfaction Survey  Follow-Up Plan:  Attend core 2

## 2014-02-03 DIAGNOSIS — E119 Type 2 diabetes mellitus without complications: Secondary | ICD-10-CM

## 2014-02-03 NOTE — Progress Notes (Signed)
Patient was seen on 02/03/2014 for the second of a series of three diabetes self-management courses at the [facility]. The following learning objectives were met by the patient during this class:   Describe the role of different macronutrients on glucose  Explain how carbohydrates affect blood glucose  State what foods contain the most carbohydrates  Demonstrate carbohydrate counting  Demonstrate how to read Nutrition Facts food label  Describe effects of various fats on heart health  Describe the importance of good nutrition for health and healthy eating strategies  Describe techniques for managing your shopping, cooking and meal planning  List strategies to follow meal plan when dining out  Describe the effects of alcohol on glucose and how to use it safely  Goals:  Follow Diabetes Meal Plan as instructed  Eat 3 meals and 2 snacks, every 3-5 hrs  Limit carbohydrate intake to 45-60 grams carbohydrate/meal Limit carbohydrate intake to 15 grams carbohydrate/snack Add lean protein foods to meals/snacks  Monitor glucose levels as instructed by your doctor   Follow-Up Plan:  Attend Core 3  Work towards following your personal food plan.

## 2014-02-03 NOTE — Patient Instructions (Signed)
Goals:  Follow Diabetes Meal Plan as instructed  Eat 3 meals and 2 snacks, every 3-5 hrs  Limit carbohydrate intake to 45-60 grams carbohydrate/meal Limit carbohydrate intake to 15 grams carbohydrate/snack Add lean protein foods to meals/snacks  Monitor glucose levels as instructed by your doctor

## 2014-02-10 DIAGNOSIS — E119 Type 2 diabetes mellitus without complications: Secondary | ICD-10-CM

## 2014-02-10 NOTE — Progress Notes (Signed)
Patient was seen on 02/10/14 for the third of a series of three diabetes self-management courses at the [facility]. The following learning objectives were met by the patient during this class:    State the amount of activity recommended for healthy living   Describe activities suitable for individual needs   Identify ways to regularly incorporate activity into daily life   Identify barriers to activity and ways to over come these barriers  Identify diabetes medications being personally used and their primary action for lowering glucose and possible side effects   Describe role of stress on blood glucose and develop strategies to address psychosocial issues   Identify diabetes complications and ways to prevent them  Explain how to manage diabetes during illness   Evaluate success in meeting personal goal   Establish 2-3 goals that they will plan to diligently work on until they return for the  7-monthfollow-up visit  Goals:  Follow Diabetes Meal Plan as instructed  Aim for 15-30 mins of physical activity daily as tolerated  Bring food record and glucose log to your follow up visit  Your patient has established the following 4 month goals in their individualized success plan: Reduce fat in my diet I will increase my activity level at least 60 minutes 6 days a week I will take my diabetes medications as scheduled I will test my glucose at least 3 times a day, 7 days a week  Your patient has identified these potential barriers to change:  None identified  Your patient has identified their diabetes self-care support plan as  [facility]  Wife

## 2014-06-15 ENCOUNTER — Ambulatory Visit: Payer: Medicare Other

## 2014-08-12 ENCOUNTER — Ambulatory Visit (INDEPENDENT_AMBULATORY_CARE_PROVIDER_SITE_OTHER): Payer: Medicare Other

## 2014-08-12 VITALS — BP 130/66 | HR 62 | Resp 15 | Ht 70.0 in | Wt 230.0 lb

## 2014-08-12 DIAGNOSIS — E1142 Type 2 diabetes mellitus with diabetic polyneuropathy: Secondary | ICD-10-CM

## 2014-08-12 DIAGNOSIS — R209 Unspecified disturbances of skin sensation: Secondary | ICD-10-CM

## 2014-08-12 DIAGNOSIS — R200 Anesthesia of skin: Secondary | ICD-10-CM

## 2014-08-12 DIAGNOSIS — E1149 Type 2 diabetes mellitus with other diabetic neurological complication: Secondary | ICD-10-CM

## 2014-08-12 DIAGNOSIS — E1140 Type 2 diabetes mellitus with diabetic neuropathy, unspecified: Secondary | ICD-10-CM

## 2014-08-12 NOTE — Patient Instructions (Signed)
Diabetes and Foot Care Diabetes may cause you to have problems because of poor blood supply (circulation) to your feet and legs. This may cause the skin on your feet to become thinner, break easier, and heal more slowly. Your skin may become dry, and the skin may peel and crack. You may also have nerve damage in your legs and feet causing decreased feeling in them. You may not notice minor injuries to your feet that could lead to infections or more serious problems. Taking care of your feet is one of the most important things you can do for yourself.  HOME CARE INSTRUCTIONS  Wear shoes at all times, even in the house. Do not go barefoot. Bare feet are easily injured.  Check your feet daily for blisters, cuts, and redness. If you cannot see the bottom of your feet, use a mirror or ask someone for help.  Wash your feet with warm water (do not use hot water) and mild soap. Then pat your feet and the areas between your toes until they are completely dry. Do not soak your feet as this can dry your skin.  Apply a moisturizing lotion or petroleum jelly (that does not contain alcohol and is unscented) to the skin on your feet and to dry, brittle toenails. Do not apply lotion between your toes.  Trim your toenails straight across. Do not dig under them or around the cuticle. File the edges of your nails with an emery board or nail file.  Do not cut corns or calluses or try to remove them with medicine.  Wear clean socks or stockings every day. Make sure they are not too tight. Do not wear knee-high stockings since they may decrease blood flow to your legs.  Wear shoes that fit properly and have enough cushioning. To break in new shoes, wear them for just a few hours a day. This prevents you from injuring your feet. Always look in your shoes before you put them on to be sure there are no objects inside.  Do not cross your legs. This may decrease the blood flow to your feet.  If you find a minor scrape,  cut, or break in the skin on your feet, keep it and the skin around it clean and dry. These areas may be cleansed with mild soap and water. Do not cleanse the area with peroxide, alcohol, or iodine.  When you remove an adhesive bandage, be sure not to damage the skin around it.  If you have a wound, look at it several times a day to make sure it is healing.  Do not use heating pads or hot water bottles. They may burn your skin. If you have lost feeling in your feet or legs, you may not know it is happening until it is too late.  Make sure your health care provider performs a complete foot exam at least annually or more often if you have foot problems. Report any cuts, sores, or bruises to your health care provider immediately. SEEK MEDICAL CARE IF:   You have an injury that is not healing.  You have cuts or breaks in the skin.  You have an ingrown nail.  You notice redness on your legs or feet.  You feel burning or tingling in your legs or feet.  You have pain or cramps in your legs and feet.  Your legs or feet are numb.  Your feet always feel cold. SEEK IMMEDIATE MEDICAL CARE IF:   There is increasing redness,   swelling, or pain in or around a wound.  There is a red line that goes up your leg.  Pus is coming from a wound.  You develop a fever or as directed by your health care provider.  You notice a bad smell coming from an ulcer or wound. Document Released: 10/27/2000 Document Revised: 07/02/2013 Document Reviewed: 04/08/2013 [employer] Patient Information 2015 [employer]. This information is not intended to replace advice given to you by your health care provider. Make sure you discuss any questions you have with your health care provider.  

## 2014-08-12 NOTE — Progress Notes (Signed)
   Subjective:    Patient ID: Kevin Suarez, male    DOB: Apr 28, 1941, 73 y.o.   MRN: [id number]  HPI Comments: N numbness L left 2, 3, 4, toes and plantar 2, 3, 4 MPJ, similar symptoms developing in the right D over 1 year O worsening over the last 6 months C feels like standing on a balled up sock, and in socks feels like the left 1st DPJ has on a tourniquet A standing, and at bedtime T no treatment attempted     Review of Systems  Genitourinary:       Current multiple kidney stones in situ.  All other systems reviewed and are negative.      Objective:   Physical Exam 72 year old white male well-developed well-nourished oriented x3 patient presents with no complaint of pain however abnormal sensation to ball of both feet feels like there is something what up or underneath his foot wound is nothing there somewhat but temperature change noted at times although normal temperatures palpated neurovascular status is intact pedal pulses are palpable DP and PT +2/4 capillary refill time 3 seconds all digits epicritic and proprioceptive sensations intact and symmetric bilateral is normal plantar response DTRs not elicited dermatologically skin color pigment normal hair growth absent nails criptotic orthopedic biomechanical exam 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 Orthopedic exam otherwise unremarkable no jugular rectus foot type no signs of fracture or other sizes abnormality rectus foot rectus lesser digits noted       Assessment & Plan:  Assessment diabetes with early peripheral neuropathy plan at this time recommended aggressive diabetic care and management also recommended a daily multivitamin with B complex and folic acid patient is advised about diabetic neuropathy and if it worsens  or fails to improve followup the next couple of months otherwise get a simple multivitamin. Don't cut multivitamin with vitamins B6 N02 and folic acid followup as needed  Harriet Masson DPM

## 2015-05-10 ENCOUNTER — Other Ambulatory Visit: Payer: Self-pay | Admitting: Urology

## 2015-05-11 ENCOUNTER — Encounter (HOSPITAL_COMMUNITY): Payer: Self-pay | Admitting: *Deleted

## 2015-05-13 ENCOUNTER — Ambulatory Visit (HOSPITAL_COMMUNITY): Payer: Medicare HMO

## 2015-05-13 ENCOUNTER — Encounter (HOSPITAL_COMMUNITY): Payer: Self-pay | Admitting: *Deleted

## 2015-05-13 ENCOUNTER — Encounter (HOSPITAL_COMMUNITY)
Admission: RE | Disposition: A | Discharge status: Home / Self Care | Payer: Self-pay | Source: Ambulatory Visit | Attending: Urology

## 2015-05-13 ENCOUNTER — Ambulatory Visit (HOSPITAL_COMMUNITY)
Admission: RE | Admit: 2015-05-13 | Discharge: 2015-05-13 | Disposition: A | Discharge status: Home / Self Care | Payer: Medicare HMO | Source: Ambulatory Visit | Attending: Urology | Admitting: Urology

## 2015-05-13 DIAGNOSIS — Z87442 Personal history of urinary calculi: Secondary | ICD-10-CM | POA: Insufficient documentation

## 2015-05-13 DIAGNOSIS — Z7982 Long term (current) use of aspirin: Secondary | ICD-10-CM | POA: Diagnosis not present

## 2015-05-13 DIAGNOSIS — Z87891 Personal history of nicotine dependence: Secondary | ICD-10-CM | POA: Diagnosis not present

## 2015-05-13 DIAGNOSIS — K4090 Unilateral inguinal hernia, without obstruction or gangrene, not specified as recurrent: Secondary | ICD-10-CM | POA: Insufficient documentation

## 2015-05-13 DIAGNOSIS — R972 Elevated prostate specific antigen [PSA]: Secondary | ICD-10-CM | POA: Insufficient documentation

## 2015-05-13 DIAGNOSIS — Z791 Long term (current) use of non-steroidal anti-inflammatories (NSAID): Secondary | ICD-10-CM | POA: Insufficient documentation

## 2015-05-13 DIAGNOSIS — N401 Enlarged prostate with lower urinary tract symptoms: Secondary | ICD-10-CM | POA: Insufficient documentation

## 2015-05-13 DIAGNOSIS — N202 Calculus of kidney with calculus of ureter: Secondary | ICD-10-CM | POA: Insufficient documentation

## 2015-05-13 DIAGNOSIS — E119 Type 2 diabetes mellitus without complications: Secondary | ICD-10-CM | POA: Insufficient documentation

## 2015-05-13 DIAGNOSIS — Z79899 Other long term (current) drug therapy: Secondary | ICD-10-CM | POA: Insufficient documentation

## 2015-05-13 DIAGNOSIS — K5730 Diverticulosis of large intestine without perforation or abscess without bleeding: Secondary | ICD-10-CM | POA: Diagnosis not present

## 2015-05-13 DIAGNOSIS — R350 Frequency of micturition: Secondary | ICD-10-CM | POA: Diagnosis not present

## 2015-05-13 DIAGNOSIS — N201 Calculus of ureter: Secondary | ICD-10-CM

## 2015-05-13 DIAGNOSIS — Z79891 Long term (current) use of opiate analgesic: Secondary | ICD-10-CM | POA: Diagnosis not present

## 2015-05-13 DIAGNOSIS — N138 Other obstructive and reflux uropathy: Secondary | ICD-10-CM | POA: Insufficient documentation

## 2015-05-13 HISTORY — PX: EXTRACORPOREAL SHOCK WAVE LITHOTRIPSY: SHX1557

## 2015-05-13 LAB — GLUCOSE, CAPILLARY
GLUCOSE-CAPILLARY: 98 mg/dL (ref 65–99)
Glucose-Capillary: 90 mg/dL (ref 65–99)

## 2015-05-13 SURGERY — LITHOTRIPSY, ESWL
Anesthesia: LOCAL | Laterality: Right

## 2015-05-13 MED ORDER — DIAZEPAM 5 MG PO TABS
10.0000 mg | ORAL_TABLET | ORAL | Status: AC
  Administered 2015-05-13: 10 mg via ORAL
  Filled 2015-05-13: qty 2

## 2015-05-13 MED ORDER — ONDANSETRON HCL 4 MG PO TABS: 4.0000 mg | ORAL_TABLET | Freq: Three times a day (TID) | ORAL | Status: DC | PRN

## 2015-05-13 MED ORDER — DIPHENHYDRAMINE HCL 25 MG PO CAPS
25.0000 mg | ORAL_CAPSULE | ORAL | Status: AC
  Administered 2015-05-13: 25 mg via ORAL
  Filled 2015-05-13: qty 1

## 2015-05-13 MED ORDER — CIPROFLOXACIN HCL 500 MG PO TABS
500.0000 mg | ORAL_TABLET | ORAL | Status: AC
  Administered 2015-05-13: 500 mg via ORAL
  Filled 2015-05-13: qty 1

## 2015-05-13 MED ORDER — SODIUM CHLORIDE 0.9 % IV SOLN
INTRAVENOUS | Status: DC
  Administered 2015-05-13: 08:00:00 via INTRAVENOUS

## 2015-05-13 NOTE — H&P (Signed)
Reason For Visit Kevin Suarez is a 74 year old male who has returned for follow-up of a right ureteral calculus.   History of Present Illness                Nephrolithiasis: A CT scan in 1/15 revealed a single stone in both the right and left kidney that were nonobstructing.       BPH with bladder outlet obstruction: I initially tried Rapaflo without improvement of his voiding symptoms. I also had placed him on an anticholinergic (VESIcare and Sanctura) in the past and this caused constipation and dry mouth. He reports that he was having urinary frequency as well as some intermittency at times.  Treatment: TUIP 11/28/13.    History of bladder calculus: He was found to have a large bladder stone on CT scan done for workup of gross hematuria.  Treatment: Cystolitholapaxy and TUIP on 11/28/13.      Elevated PSA: He has had PSA elevation in the past and with continued observation the PSA then fell on more than one occasion. He has always maintained a very good free to total ratio.    Interval history: He was recently seen with right flank pain and a KUB revealed a 6.6 mm calcification along the expected course of the right ureter. Follow-up KUB revealed the calcification remained unchanged in location. His urinalysis on both occasions was clear of any red blood cells. He was empirically placed on medical expulsive therapy. He reported that the pain that he had been having in his right flank was worsened by weed eating and pushing a lawnmower. It was also found to be tender to palpation with no evidence of rash. I therefore recommended evaluation of this area further with a CT scan to absolutely determine if the calcification was within the ureter.  he reports that he continues to have dull, aching pain in his right flank region. He has not seen any hematuria. He also told me that he's been having very vivid dreams every night and wondered if any of the medication that he was taking might be  causing this. Since I had given him tamsulosin I'm going to have him stop this and see if the vivid dreams resolved. If he needs an alpha-blocker Rapaflo could be tried.    Past Medical History Problems  1. History of kidney stones (Z87.442) 2. History of Pneumonia  Surgical History Problems  1. History of Cystoscopy With Removal Of Object 2. History of Hand Surgery 3. History of Total Hip Replacement 4. History of Transurethral Incision Of Prostate 5. History of Wrist Surgery  Current Meds 1. Aleve 220 MG Oral Capsule;  Therapy: (Recorded:27Jun2016) to Recorded 2. Aspirin 81 MG TABS;  Therapy: (Recorded:09Mar2010) to Recorded 3. Hydrocodone-Acetaminophen 5-325 MG Oral Tablet; Take 1-2 tablets every 4-6 hours for  pain;  Therapy: [id number] to (Last Rx:19May2016) Ordered 4. Losartan Potassium 50 MG Oral Tablet;  Therapy: (Recorded:12Jan2015) to Recorded 5. MetFORMIN HCl - 500 MG Oral Tablet;  Therapy: (Recorded:12Jan2015) to Recorded 6. Tamsulosin HCl - 0.4 MG Oral Capsule; Take 1 capsule by mouth at bedtime;  Therapy: [id number] to (Evaluate:08Jul2016)  Requested for: [phone]; Last  Rx:08Jun2016 Ordered  Allergies Medication  1. Flomax CP24 2. Levaquin TABS  Family History Problems  1. Family history of Family Health Status Number Of Children   1 son; 2 daughters 2. Family history of Father Deceased At Age ____   Died from a valve defect in the heart. 3. Family history of Heart Disease : Father  4. Family history of Mother Deceased At Age ____   Died at 28 from cancer, pt cant recall.  Social History Problems  1. Denied: History of Alcohol Use 2. Caffeine Use   5 per day 3. Current every day smoker (F17.200)   quit in 2004 4. Denied: History of Drug Use 5. Marital History - Currently Married 6. Occupation:   retired 40. Denied: History of Tobacco Use  Review of Systems Genitourinary and gastrointestinal system(s) were reviewed and pertinent findings  if present are noted and are otherwise negative.    Vitals Vital Signs [Data Includes: Last 1 Day]  Recorded: 27Jun2016 03:04PM  Height: 5 ft 10 in Weight: 238 lb  BMI Calculated: 34.15 BSA Calculated: 2.25 Blood Pressure: 98 / 57 Heart Rate: 75  WD WN male in NAD Resp: nl effort Cards: RRR Abd: soft NT class 3 airway Results/Data Urine [Data Includes: Last 1 Day]   [id number] COLOR AMBER  APPEARANCE CLEAR  SPECIFIC GRAVITY 1.030  pH 5.5  GLUCOSE NEG mg/dL BILIRUBIN NEG  KETONE TRACE mg/dL BLOOD NEG  PROTEIN TRACE mg/dL UROBILINOGEN 1 mg/dL NITRITE NEG  LEUKOCYTE ESTERASE NEG   The following images/tracing/specimen were independently visualized:  CT.  The following clinical lab reports were reviewed:  UA: Clear again today. Selected Results  AU CT-STONE PROTOCOL [id number] 12:00AM Kevin Suarez  Test Name Result Flag Reference CT-STONE PROTOCOL (Report)   ** RADIOLOGY REPORT BY [facility] **   CLINICAL DATA: Followup renal calculi, history diabetes, BPH, RIGHT side back pain for 6 weeks  EXAM: CT ABDOMEN AND PELVIS WITHOUT CONTRAST  TECHNIQUE: Multidetector CT imaging of the abdomen and pelvis was performed following the standard protocol without IV contrast. Sagittal and coronal MPR images reconstructed from axial data set. Oral contrast not administered for this indication.  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  Tiny BILATERAL nonobstructing renal calculi largest 4 mm diameter RIGHT kidney image 20.  Additional 11 x 6 x 5 mm proximal RIGHT ureteral calculus image 36.  However no hydronephrosis is identified.  Small RIGHT renal cyst seen on previous exam poorly delineated on current noncontrast study.  New small RIGHT-side bladder calculus 3 mm diameter image  59.  Remaining ureters and bladder unremarkable.  Within limitations of a nonenhanced exam, no focal abnormalities of the liver, gallbladder, spleen, pancreas, or adrenal glands.  Normal appendix.  Stomach and small bowel loops unremarkable for technique.  Descending and sigmoid colonic diverticulosis without evidence of diverticulitis.  Extension of a loop of sigmoid colon into a LEFT inguinal hernia without evidence of obstruction.  Scattered atherosclerotic calcifications.  Beam hardening artifacts from BILATERAL hip prostheses obscure portions of pelvis.  No mass, adenopathy, free air or free fluid.  Diffuse osseous demineralization.  Scattered degenerative disc disease changes lumbar spine.  IMPRESSION: Nonobstructing BILATERAL renal calculi.  Additional 11 x 6 x 5 mm proximal RIGHT ureteral calculus without hydronephrosis.  LEFT inguinal hernia containing a nonobstructed segment of sigmoid colon.  Distal colonic diverticulosis.  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   Electronically Signed  By: Lavonia Dana M.D.  On: 04/28/2015 14:27  Assessment Assessed  1. Calculus of right ureter (N20.1)      I went over the results of his CT scan with him today. It reveals that the calcification seen on his previous KUBs is in fact a stone within  the ureter that has Hounsfield units of ~600. In addition there were bilateral punctate renal calculi noted.     We discussed the management of urinary stones. These options include observation, ureteroscopy, shockwave lithotripsy, and PCNL. We discussed which options are relevant to these particular stones. We discussed the natural history of stones as well as the complications of untreated stones and the impact on quality of life without treatment as well as with each of the above listed treatments. We also discussed the efficacy of each treatment in its ability to clear the stone burden. With any of these management options I  discussed the signs and symptoms of infection and the need for emergent treatment should these be experienced. For each option we discussed the ability of each procedure to clear the patient of their stone burden.    For observation I described the risks which include but are not limited to silent renal damage, life-threatening infection, need for emergent surgery, failure to pass stone, and pain.    For ureteroscopy I described the risks which include heart attack, stroke, pulmonary embolus, death, bleeding, infection, damage to contiguous structures, positioning injury, ureteral stricture, ureteral avulsion, ureteral injury, need for ureteral stent, inability to perform ureteroscopy, need for an interval procedure, inability to clear stone burden, stent discomfort and pain.    For shockwave lithotripsy I described the risks which include arrhythmia, kidney contusion, kidney hemorrhage, need for transfusion, long-term risk of diabetes or hypertension, back discomfort, flank ecchymosis, flank abrasion, inability to break up stone, inability to pass stone fragments, Steinstrasse, infection associated with obstructing stones, need for different surgical procedure and possible need for repeat shockwave lithotripsy.    Due to the stone's size, location and density he would be an excellent candidate for lithotripsy and this is what he has elected to proceed with at this time. I have recommended he remain on tamsulosin until after the lithotripsy has been performed and at his follow-up this could be stopped if his stone fragments have all passed. He also takes a daily aspirin which she will stop prior to this procedure as well.   Plan Calculus of right ureter  1. Follow-up Schedule Surgery Office  Follow-up  Status: Hold For - Appointment   Requested for: 27Jun2016 Health Maintenance  2. UA With REFLEX; [Do Not Release]; Status:Complete;   Done: [id number] 02:32PM  1. Stop tamsulosin and see if  this eliminates the drains he is having.  2. Stop aspirin in preparation for lithotripsy.  3. He will undergo lithotripsy of his right mid ureteral stone located on the right hand side at the L4-L5 interspace.     Discussion/Summary CC: Dr. Osborne Casco     Signatures Electronically signed by : Kevin Suarez, M.D.; May 10 2015  3:30PM EST Artist)

## 2015-05-13 NOTE — Interval H&P Note (Signed)
History and Physical Interval Note:  05/13/2015 8:21 AM  Kevin Suarez  has presented today for surgery, with the diagnosis of RIGHT MID URETERAL STONE  The various methods of treatment have been discussed with the patient and family. After consideration of risks, benefits and other options for treatment, the patient has consented to  Procedure(s): RIGHT EXTRACORPOREAL SHOCK WAVE LITHOTRIPSY (ESWL) (Right) as a surgical intervention .  The patient's history has been reviewed, patient examined, no change in status, stable for surgery.  I have reviewed the patient's chart and labs.  Questions were answered to the patient's satisfaction.     Dellar Traber S

## 2015-05-13 NOTE — Discharge Instructions (Signed)
See [facility] discharge instructions in chart.  

## 2015-05-13 NOTE — Op Note (Signed)
See [facility] note scanned into chart. 

## 2015-08-26 DIAGNOSIS — N201 Calculus of ureter: Secondary | ICD-10-CM | POA: Diagnosis not present

## 2015-08-26 DIAGNOSIS — N200 Calculus of kidney: Secondary | ICD-10-CM | POA: Diagnosis not present

## 2015-09-11 DIAGNOSIS — Z23 Encounter for immunization: Secondary | ICD-10-CM | POA: Diagnosis not present

## 2015-09-16 DIAGNOSIS — N201 Calculus of ureter: Secondary | ICD-10-CM | POA: Diagnosis not present

## 2015-09-30 DIAGNOSIS — N3281 Overactive bladder: Secondary | ICD-10-CM | POA: Diagnosis not present

## 2015-09-30 DIAGNOSIS — N202 Calculus of kidney with calculus of ureter: Secondary | ICD-10-CM | POA: Diagnosis not present

## 2015-09-30 DIAGNOSIS — N210 Calculus in bladder: Secondary | ICD-10-CM | POA: Diagnosis not present

## 2015-10-01 ENCOUNTER — Other Ambulatory Visit: Payer: Self-pay | Admitting: Urology

## 2015-10-01 DIAGNOSIS — H11823 Conjunctivochalasis, bilateral: Secondary | ICD-10-CM | POA: Diagnosis not present

## 2015-10-01 DIAGNOSIS — H11422 Conjunctival edema, left eye: Secondary | ICD-10-CM | POA: Diagnosis not present

## 2015-10-01 DIAGNOSIS — H40013 Open angle with borderline findings, low risk, bilateral: Secondary | ICD-10-CM | POA: Diagnosis not present

## 2015-10-01 DIAGNOSIS — H01009 Unspecified blepharitis unspecified eye, unspecified eyelid: Secondary | ICD-10-CM | POA: Diagnosis not present

## 2015-10-11 ENCOUNTER — Encounter (HOSPITAL_BASED_OUTPATIENT_CLINIC_OR_DEPARTMENT_OTHER): Payer: Self-pay | Admitting: *Deleted

## 2015-10-11 DIAGNOSIS — R69 Illness, unspecified: Secondary | ICD-10-CM | POA: Diagnosis not present

## 2015-10-11 NOTE — Progress Notes (Signed)
NPO AFTER MN.  ARRIVE AT 0715.  NEED ISTAT  AND EKG.  WILL TAKE COZAAR AM DOS W/ SIPS OF WATER.

## 2015-10-15 ENCOUNTER — Ambulatory Visit (HOSPITAL_BASED_OUTPATIENT_CLINIC_OR_DEPARTMENT_OTHER)
Admission: RE | Admit: 2015-10-15 | Discharge: 2015-10-15 | Disposition: A | Discharge status: Home / Self Care | Payer: Medicare HMO | Source: Ambulatory Visit | Attending: Urology | Admitting: Urology

## 2015-10-15 ENCOUNTER — Other Ambulatory Visit: Payer: Self-pay

## 2015-10-15 ENCOUNTER — Encounter (HOSPITAL_BASED_OUTPATIENT_CLINIC_OR_DEPARTMENT_OTHER)
Admission: RE | Disposition: A | Discharge status: Home / Self Care | Payer: Self-pay | Source: Ambulatory Visit | Attending: Urology

## 2015-10-15 ENCOUNTER — Encounter (HOSPITAL_BASED_OUTPATIENT_CLINIC_OR_DEPARTMENT_OTHER): Payer: Self-pay | Admitting: *Deleted

## 2015-10-15 ENCOUNTER — Ambulatory Visit (HOSPITAL_BASED_OUTPATIENT_CLINIC_OR_DEPARTMENT_OTHER): Payer: Medicare HMO | Admitting: Anesthesiology

## 2015-10-15 DIAGNOSIS — Z791 Long term (current) use of non-steroidal anti-inflammatories (NSAID): Secondary | ICD-10-CM | POA: Insufficient documentation

## 2015-10-15 DIAGNOSIS — Z87442 Personal history of urinary calculi: Secondary | ICD-10-CM | POA: Diagnosis not present

## 2015-10-15 DIAGNOSIS — Z7982 Long term (current) use of aspirin: Secondary | ICD-10-CM | POA: Insufficient documentation

## 2015-10-15 DIAGNOSIS — N401 Enlarged prostate with lower urinary tract symptoms: Secondary | ICD-10-CM | POA: Insufficient documentation

## 2015-10-15 DIAGNOSIS — Z7984 Long term (current) use of oral hypoglycemic drugs: Secondary | ICD-10-CM | POA: Insufficient documentation

## 2015-10-15 DIAGNOSIS — E119 Type 2 diabetes mellitus without complications: Secondary | ICD-10-CM | POA: Insufficient documentation

## 2015-10-15 DIAGNOSIS — F17200 Nicotine dependence, unspecified, uncomplicated: Secondary | ICD-10-CM | POA: Diagnosis not present

## 2015-10-15 DIAGNOSIS — Z79891 Long term (current) use of opiate analgesic: Secondary | ICD-10-CM | POA: Insufficient documentation

## 2015-10-15 DIAGNOSIS — Z79899 Other long term (current) drug therapy: Secondary | ICD-10-CM | POA: Insufficient documentation

## 2015-10-15 DIAGNOSIS — Z96649 Presence of unspecified artificial hip joint: Secondary | ICD-10-CM | POA: Insufficient documentation

## 2015-10-15 DIAGNOSIS — R350 Frequency of micturition: Secondary | ICD-10-CM | POA: Diagnosis not present

## 2015-10-15 DIAGNOSIS — K219 Gastro-esophageal reflux disease without esophagitis: Secondary | ICD-10-CM | POA: Insufficient documentation

## 2015-10-15 DIAGNOSIS — N138 Other obstructive and reflux uropathy: Secondary | ICD-10-CM | POA: Insufficient documentation

## 2015-10-15 DIAGNOSIS — I10 Essential (primary) hypertension: Secondary | ICD-10-CM | POA: Insufficient documentation

## 2015-10-15 DIAGNOSIS — N210 Calculus in bladder: Secondary | ICD-10-CM | POA: Insufficient documentation

## 2015-10-15 DIAGNOSIS — N400 Enlarged prostate without lower urinary tract symptoms: Secondary | ICD-10-CM

## 2015-10-15 HISTORY — DX: Presence of dental prosthetic device (complete) (partial): Z97.2

## 2015-10-15 HISTORY — PX: CYSTOSCOPY WITH LITHOLAPAXY: SHX1425

## 2015-10-15 HISTORY — PX: HOLMIUM LASER APPLICATION: SHX5852

## 2015-10-15 HISTORY — DX: Presence of spectacles and contact lenses: Z97.3

## 2015-10-15 HISTORY — DX: Calculus of kidney: N20.0

## 2015-10-15 LAB — GLUCOSE, CAPILLARY: Glucose-Capillary: 102 mg/dL — ABNORMAL HIGH (ref 65–99)

## 2015-10-15 LAB — POCT I-STAT 4, (NA,K, GLUC, HGB,HCT)
Glucose, Bld: 111 mg/dL — ABNORMAL HIGH (ref 65–99)
HCT: 38 % — ABNORMAL LOW (ref 39.0–52.0)
Hemoglobin: 12.9 g/dL — ABNORMAL LOW (ref 13.0–17.0)
Potassium: 4.2 mmol/L (ref 3.5–5.1)
Sodium: 141 mmol/L (ref 135–145)

## 2015-10-15 SURGERY — CYSTOSCOPY, WITH BLADDER CALCULUS LITHOLAPAXY
Anesthesia: General | Site: Renal

## 2015-10-15 MED ORDER — LACTATED RINGERS IV SOLN
INTRAVENOUS | Status: DC
  Filled 2015-10-15: qty 1000

## 2015-10-15 MED ORDER — LACTATED RINGERS IV SOLN
INTRAVENOUS | Status: DC
  Administered 2015-10-15 (×2): via INTRAVENOUS
  Filled 2015-10-15: qty 1000

## 2015-10-15 MED ORDER — TAMSULOSIN HCL 0.4 MG PO CAPS
0.4000 mg | ORAL_CAPSULE | Freq: Once | ORAL | Status: AC
  Administered 2015-10-15: 0.4 mg via ORAL
  Filled 2015-10-15: qty 1

## 2015-10-15 MED ORDER — ACETAMINOPHEN 10 MG/ML IV SOLN
INTRAVENOUS | Status: AC
  Filled 2015-10-15: qty 100

## 2015-10-15 MED ORDER — ACETAMINOPHEN 10 MG/ML IV SOLN
INTRAVENOUS | Status: DC | PRN
  Administered 2015-10-15: 1000 mg via INTRAVENOUS

## 2015-10-15 MED ORDER — ONDANSETRON HCL 4 MG/2ML IJ SOLN
INTRAMUSCULAR | Status: AC
  Filled 2015-10-15: qty 2

## 2015-10-15 MED ORDER — PHENAZOPYRIDINE HCL 200 MG PO TABS: 200.0000 mg | ORAL_TABLET | Freq: Three times a day (TID) | ORAL | Status: DC | PRN

## 2015-10-15 MED ORDER — PHENAZOPYRIDINE HCL 100 MG PO TABS
ORAL_TABLET | ORAL | Status: AC
  Filled 2015-10-15: qty 2

## 2015-10-15 MED ORDER — SODIUM CHLORIDE 0.9 % IR SOLN
Status: DC | PRN
  Administered 2015-10-15: 3000 mL via INTRAVESICAL
  Administered 2015-10-15: 6000 mL via INTRAVESICAL

## 2015-10-15 MED ORDER — PROPOFOL 10 MG/ML IV BOLUS
INTRAVENOUS | Status: AC
  Filled 2015-10-15: qty 20

## 2015-10-15 MED ORDER — PHENAZOPYRIDINE HCL 200 MG PO TABS
200.0000 mg | ORAL_TABLET | Freq: Once | ORAL | Status: AC
  Administered 2015-10-15: 200 mg via ORAL
  Filled 2015-10-15: qty 1

## 2015-10-15 MED ORDER — FENTANYL CITRATE (PF) 100 MCG/2ML IJ SOLN
INTRAMUSCULAR | Status: DC | PRN
  Administered 2015-10-15 (×4): 25 ug via INTRAVENOUS

## 2015-10-15 MED ORDER — CEFAZOLIN SODIUM 1-5 GM-% IV SOLN
1.0000 g | INTRAVENOUS | Status: DC
  Filled 2015-10-15: qty 50

## 2015-10-15 MED ORDER — HYDROCODONE-ACETAMINOPHEN 10-325 MG PO TABS: 1.0000 | ORAL_TABLET | ORAL | Status: DC | PRN

## 2015-10-15 MED ORDER — TAMSULOSIN HCL 0.4 MG PO CAPS
ORAL_CAPSULE | ORAL | Status: AC
  Filled 2015-10-15: qty 1

## 2015-10-15 MED ORDER — ONDANSETRON HCL 4 MG/2ML IJ SOLN
INTRAMUSCULAR | Status: DC | PRN
  Administered 2015-10-15: 4 mg via INTRAVENOUS

## 2015-10-15 MED ORDER — CEFAZOLIN SODIUM-DEXTROSE 2-3 GM-% IV SOLR
2.0000 g | INTRAVENOUS | Status: AC
  Administered 2015-10-15: 2 g via INTRAVENOUS
  Filled 2015-10-15: qty 50

## 2015-10-15 MED ORDER — LIDOCAINE HCL (CARDIAC) 20 MG/ML IV SOLN
INTRAVENOUS | Status: AC
  Filled 2015-10-15: qty 5

## 2015-10-15 MED ORDER — CEFAZOLIN SODIUM-DEXTROSE 2-3 GM-% IV SOLR
INTRAVENOUS | Status: AC
  Filled 2015-10-15: qty 50

## 2015-10-15 MED ORDER — PROPOFOL 10 MG/ML IV BOLUS
INTRAVENOUS | Status: DC | PRN
  Administered 2015-10-15: 180 mg via INTRAVENOUS

## 2015-10-15 MED ORDER — FENTANYL CITRATE (PF) 100 MCG/2ML IJ SOLN
25.0000 ug | INTRAMUSCULAR | Status: DC | PRN
  Filled 2015-10-15: qty 1

## 2015-10-15 MED ORDER — LIDOCAINE HCL (CARDIAC) 20 MG/ML IV SOLN
INTRAVENOUS | Status: DC | PRN
  Administered 2015-10-15: 60 mg via INTRAVENOUS

## 2015-10-15 MED ORDER — FENTANYL CITRATE (PF) 100 MCG/2ML IJ SOLN
INTRAMUSCULAR | Status: AC
  Filled 2015-10-15: qty 2

## 2015-10-15 SURGICAL SUPPLY — 42 items
ADAPTER CATH URET PLST 4-6FR (CATHETERS) IMPLANT
BAG DRAIN URO-CYSTO SKYTR STRL (DRAIN) ×3 IMPLANT
BAG DRN UROCATH (DRAIN) ×2
BASKET LASER NITINOL 1.9FR (BASKET) IMPLANT
BASKET STNLS GEMINI 4WIRE 3FR (BASKET) IMPLANT
BASKET ZERO TIP NITINOL 2.4FR (BASKET) IMPLANT
BSKT STON RTRVL ZERO TP 2.4FR (BASKET)
CANISTER SUCT LVC 12 LTR MEDI- (MISCELLANEOUS) IMPLANT
CATH INTERMIT  6FR 70CM (CATHETERS) IMPLANT
CATH URET 5FR 28IN CONE TIP (BALLOONS)
CATH URET 5FR 70CM CONE TIP (BALLOONS) IMPLANT
CLOTH BEACON ORANGE TIMEOUT ST (SAFETY) ×3 IMPLANT
ELECT BIVAP BIPO 22/24 DONUT (ELECTROSURGICAL) ×3
ELECT REM PT RETURN 9FT ADLT (ELECTROSURGICAL)
ELECTRD BIVAP BIPO 22/24 DONUT (ELECTROSURGICAL) ×2 IMPLANT
ELECTRODE REM PT RTRN 9FT ADLT (ELECTROSURGICAL) IMPLANT
EVACUATOR MICROVAS BLADDER (UROLOGICAL SUPPLIES) ×3 IMPLANT
FIBER LASER FLEXIVA 365 (UROLOGICAL SUPPLIES) IMPLANT
FIBER LASER FLEXIVA 550 (UROLOGICAL SUPPLIES) IMPLANT
FIBER LASER TRAC TIP (UROLOGICAL SUPPLIES) IMPLANT
GLOVE BIO SURGEON STRL SZ8 (GLOVE) ×3 IMPLANT
GOWN STRL REUS W/ TWL LRG LVL3 (GOWN DISPOSABLE) ×2 IMPLANT
GOWN STRL REUS W/ TWL XL LVL3 (GOWN DISPOSABLE) ×2 IMPLANT
GOWN STRL REUS W/TWL LRG LVL3 (GOWN DISPOSABLE) ×2
GOWN STRL REUS W/TWL XL LVL3 (GOWN DISPOSABLE) ×1
GOWN XL W/COTTON TOWEL STD (GOWNS) ×3 IMPLANT
GUIDEWIRE 0.038 PTFE COATED (WIRE) IMPLANT
GUIDEWIRE ANG ZIPWIRE 038X150 (WIRE) IMPLANT
GUIDEWIRE STR DUAL SENSOR (WIRE) IMPLANT
IV NS IRRIG 3000ML ARTHROMATIC (IV SOLUTION) ×6 IMPLANT
KIT BALLIN UROMAX 15FX10 (LABEL) IMPLANT
KIT BALLN UROMAX 15FX4 (MISCELLANEOUS) IMPLANT
KIT BALLN UROMAX 26 75X4 (MISCELLANEOUS)
KIT ROOM TURNOVER WOR (KITS) ×3 IMPLANT
LASER FIBER DISP 1000U (UROLOGICAL SUPPLIES) ×3 IMPLANT
MANIFOLD NEPTUNE II (INSTRUMENTS) ×3 IMPLANT
NS IRRIG 500ML POUR BTL (IV SOLUTION) IMPLANT
PACK CYSTO (CUSTOM PROCEDURE TRAY) ×3 IMPLANT
SET HIGH PRES BAL DIL (LABEL)
TUBE CONNECTING 12X1/4 (SUCTIONS) IMPLANT
WATER STERILE IRR 3000ML UROMA (IV SOLUTION) IMPLANT
WATER STERILE IRR 500ML POUR (IV SOLUTION) ×3 IMPLANT

## 2015-10-15 NOTE — Anesthesia Preprocedure Evaluation (Signed)
Anesthesia Evaluation  Patient identified by MRN, date of birth, ID band Patient awake    Reviewed: Allergy & Precautions, H&P , NPO status , Patient's Chart, lab work & pertinent test results  Airway Mallampati: III  TM Distance: >3 FB Neck ROM: Full    Dental  (+) Edentulous Upper, Partial Lower, Dental Advisory Given   Pulmonary former smoker,    Pulmonary exam normal breath sounds clear to auscultation       Cardiovascular hypertension, Pt. on medications Normal cardiovascular exam Rhythm:Regular Rate:Normal     Neuro/Psych negative neurological ROS  negative psych ROS   GI/Hepatic Neg liver ROS, GERD  ,  Endo/Other  diabetes, Well Controlled, Type 2, Oral Hypoglycemic Agents  Renal/GU negative Renal ROS  negative genitourinary   Musculoskeletal negative musculoskeletal ROS (+)   Abdominal   Peds  Hematology negative hematology ROS (+)   Anesthesia Other Findings   Reproductive/Obstetrics                             Anesthesia Physical Anesthesia Plan  ASA: III  Anesthesia Plan: General   Post-op Pain Management:    Induction: Intravenous  Airway Management Planned: LMA  Additional Equipment:   Intra-op Plan:   Post-operative Plan:   Informed Consent:   Plan Discussed with: Surgeon  Anesthesia Plan Comments:         Anesthesia Quick Evaluation

## 2015-10-15 NOTE — H&P (Signed)
Kevin Suarez is a 74 year old male with bladder calculi.  History of Present Illness Nephrolithiasis: A CT scan in 1/15 revealed a single stone in both the right and left kidney that were nonobstructing.       BPH with bladder outlet obstruction: I initially tried Rapaflo without improvement of his voiding symptoms. I also had placed him on an anticholinergic (VESIcare and Sanctura) in the past and this caused constipation and dry mouth. He reports that he was having urinary frequency as well as some intermittency at times.  Treatment: TUIP 11/28/13.    History of bladder calculus: He was found to have a large bladder stone on CT scan done for workup of gross hematuria.  Treatment: Cystolitholapaxy and TUIP on 11/28/13.      Elevated PSA: He has had PSA elevation in the past and with continued observation the PSA then fell on more than one occasion. He has always maintained a very good free to total ratio.   He was seen recently for follow-up of lower urinary tract symptoms.  He still complains of increased frequency, urgency, and mild dysuria. He denies the passage of the stone but he was continued on tamsulosin. He notes a decrease in his back pain and there has not been any renal colic. He denies gross hematuria. He states OTC AZO has helped some. There is has not been any fever.  Past Medical History Problems  1. History of kidney stones (Z87.442) 2. History of Pneumonia  Surgical History Problems  1. History of Cystoscopy With Removal Of Object 2. History of Hand Surgery 3. History of Total Hip Replacement 4. History of Transurethral Incision Of Prostate 5. History of Wrist Surgery  Current Meds 1. Aleve 220 MG Oral Capsule;  Therapy: (Recorded:27Jun2016) to Recorded 2. Aspirin 81 MG TABS;  Therapy: (Recorded:09Mar2010) to Recorded 3. Hydrocodone-Acetaminophen 5-325 MG Oral Tablet; Take 1-2 tablets every 4-6 hours for  pain;  Therapy: [id number] to (Last Rx:19May2016)  Ordered 4. Losartan Potassium 50 MG Oral Tablet;  Therapy: (Recorded:12Jan2015) to Recorded 5. MetFORMIN HCl - 500 MG Oral Tablet;  Therapy: (Recorded:12Jan2015) to Recorded 6. Tamsulosin HCl - 0.4 MG Oral Capsule; Take 1 capsule by mouth at bedtime;  Therapy: [phone] to (Evaluate:08Jul2016)  Requested for: [phone]; Last  Rx:08Jun2016 Ordered  Review of Systems Genitourinary, constitutional, skin, eye, otolaryngeal, hematologic/lymphatic, cardiovascular, pulmonary, endocrine, musculoskeletal, gastrointestinal, neurological and psychiatric system(s) were reviewed and pertinent findings if present are noted and are otherwise negative.  Genitourinary: As above  Allergies Medication  1. Flomax CP24 2. Levaquin TABS  Family History Problems  1. Family history of Family Health Status Number Of Children   1 son; 2 daughters 2. Family history of Father Deceased At Age ____   Died from a valve defect in the heart. 3. Family history of Heart Disease : Father 4. Family history of Mother Deceased At Age ____   Died at 40 from cancer, pt cant recall.  Social History Problems  1. Denied: History of Alcohol Use 2. Caffeine Use   5 per day 3. Current every day smoker (F17.200)   quit in 2004 4. Denied: History of Drug Use 5. Marital History - Currently Married 6. Occupation:   retired 79. Denied: History of Tobacco Use  Vitals Vital Signs   Blood Pressure: 133 / 76 Temperature: 98.1 F Heart Rate: 69  Physical Exam Constitutional: Well nourished and well developed . No acute distress.  Pulmonary: No respiratory distress and normal respiratory rhythm and effort.  Cardiovascular: Heart rate  and rhythm are normal . No peripheral edema.  Abdomen: The abdomen is soft and nontender. No CVA tenderness.  Rectal: Rectal exam demonstrates normal sphincter tone and the anus is normal on inspection. Estimated prostate size is 3+. Normal rectal tone, no rectal masses, prostate is  smooth, symmetric and non-tender. The prostate is not indurated and is not tender.  Neuro/Psych:. Mood and affect are appropriate.   Bladder ultrasound there is approximately a 6.5 mm calculus with good definitive shadowing. It is best viewed in the sagittal view. Dimensions of the bladder are 8.48 cm x 5.44 cm x 7 cm. There was 169 mL of urine within the bladder.   Impression: He has redeveloped a bladder calculus which is causing him significant voiding symptoms.  We therefore discussed proceeding with cystolitholapaxy.  I had previously performed a transurethral incision of his prostate and so I will reassess his outlet at the time of his procedure and determine if any further outlet resistance reduction needs to be performed.  I've gone over the procedure with the patient in detail including its risks and complications, the alternatives, the outpatient nature of the procedure as well as the probability of success and anticipated postoperative course.  He understands and is elected to proceed.  Plan: Cystolitholapaxy with possible outlet reduction procedure.

## 2015-10-15 NOTE — Discharge Instructions (Signed)
Post Bladder Surgery Instructions ° ° °General instructions: °   ° Your recent bladder surgery requires very little post hospital care but some definite precautions. ° °Despite the fact that no skin incisions were used, the area around the bladder incisions are raw and covered with scabs to promote healing and prevent bleeding. Certain precautions are needed to insure that the scabs are not disturbed over the next 2-4 weeks while the healing proceeds. ° °Because the raw surface inside your bladder and the irritating effects of urine you may expect frequency of urination and/or urgency (a stronger desire to urinate) and perhaps even getting up at night more often. This will usually resolve or improve slowly over the healing period. You may see some blood in your urine over the first 6 weeks. Do not be alarmed, even if the urine was clear for a while. Get off your feet and drink lots of fluids until clearing occurs. If you start to pass clots or don't improve call us. ° °Catheter: (If you are discharged with a catheter.) ° °1. Keep your catheter secured to your leg at all times with tape or the supplied strap. °2. You may experience leakage of urine around your catheter- as long as the  °catheter continues to drain, this is normal.  If your catheter stops draining  °go to the ER. °3. You may also have blood in your urine, even after it has been clear for  °several days; you may even pass some small blood clots or other material.  This  °is normal as well.  If this happens, sit down and drink plenty of water to help  °make urine to flush out your bladder.  If the blood in your urine becomes worse  °after doing this, contact our office or return to the ER. °4. You may use the leg bag (small bag) during the day, but use the large bag at  °night. ° °Diet: ° °You may return to your normal diet immediately. Because of the raw surface of your bladder, alcohol, spicy foods, foods high in acid and drinks with caffeine may  cause irritation or frequency and should be used in moderation. To keep your urine flowing freely and avoid constipation, drink plenty of fluids during the day (8-10 glasses). Tip: Avoid cranberry juice because it is very acidic. ° °Activity: ° °Your physical activity doesn't need to be restricted. However, if you are very active, you may see some blood in the urine. We suggest that you reduce your activity under the circumstances until the bleeding has stopped. ° °Bowels: ° °It is important to keep your bowels regular during the postoperative period. Straining with bowel movements can cause bleeding. A bowel movement every other day is reasonable. Use a mild laxative if needed, such as milk of magnesia 2-3 tablespoons, or 2 Dulcolax tablets. Call if you continue to have problems. If you had been taking narcotics for pain, before, during or after your surgery, you may be constipated. Take a laxative if necessary. ° ° ° °Medication: ° °You should resume your pre-surgery medications unless told not to. In addition you may be given an antibiotic to prevent or treat infection. Antibiotics are not always necessary. All medication should be taken as prescribed until the bottles are finished unless you are having an unusual reaction to one of the drugs. ° ° °Post Anesthesia Home Care Instructions ° °Activity: °Get plenty of rest for the remainder of the day. A responsible adult should stay with you for   24 hours following the procedure.  For the next 24 hours, DO NOT: -Drive a car -Operate machinery -Drink alcoholic beverages -Take any medication unless instructed by your physician -Make any legal decisions or sign important papers.  Meals: Start with liquid foods such as gelatin or soup. Progress to regular foods as tolerated. Avoid greasy, spicy, heavy foods. If nausea and/or vomiting occur, drink only clear liquids until the nausea and/or vomiting subsides. Call your physician if vomiting continues.  Special  Instructions/Symptoms: Your throat may feel dry or sore from the anesthesia or the breathing tube placed in your throat during surgery. If this causes discomfort, gargle with warm salt water. The discomfort should disappear within 24 hours.  If you had a scopolamine patch placed behind your ear for the management of post- operative nausea and/or vomiting:  1. The medication in the patch is effective for 72 hours, after which it should be removed.  Wrap patch in a tissue and discard in the trash. Wash hands thoroughly with soap and water. 2. You may remove the patch earlier than 72 hours if you experience unpleasant side effects which may include dry mouth, dizziness or visual disturbances. 3. Avoid touching the patch. Wash your hands with soap and water after contact with the patch.    

## 2015-10-15 NOTE — Transfer of Care (Signed)
Immediate Anesthesia Transfer of Care Note  Patient: JIHO ECKART  Procedure(s) Performed: Procedure(s) (LRB): CYSTOSCOPY WITH LITHOLAPAXY, HOLMIUM LASER LITHOTRIPSY tranurethral excision of prostate (N/A) HOLMIUM LASER APPLICATION (N/A)  Patient Location: PACU  Anesthesia Type: General  Level of Consciousness: awake, sedated, patient cooperative and responds to stimulation  Airway & Oxygen Therapy: Patient Spontanous Breathing and Patient connected to face mask oxygen  Post-op Assessment: Report given to PACU RN, Post -op Vital signs reviewed and stable and Patient moving all extremities  Post vital signs: Reviewed and stable  Complications: No apparent anesthesia complications

## 2015-10-15 NOTE — Anesthesia Procedure Notes (Signed)
Procedure Name: LMA Insertion Date/Time: 10/15/2015 9:02 AM Performed by: Justice Rocher Pre-anesthesia Checklist: Patient identified, Emergency Drugs available, Suction available and Patient being monitored Patient Re-evaluated:Patient Re-evaluated prior to inductionOxygen Delivery Method: Circle System Utilized Preoxygenation: Pre-oxygenation with 100% oxygen Intubation Type: IV induction Ventilation: Mask ventilation without difficulty LMA: LMA inserted LMA Size: 5.0 Number of attempts: 1 Airway Equipment and Method: Bite block Placement Confirmation: positive ETCO2 Tube secured with: Tape Dental Injury: Teeth and Oropharynx as per pre-operative assessment

## 2015-10-15 NOTE — Op Note (Signed)
PATIENT:  Kevin Suarez  PRE-OPERATIVE DIAGNOSIS: 1. Bladder calculus 2. History of BPH with outlet obstruction   POST-OPERATIVE DIAGNOSIS: Same  PROCEDURE: 1. Cystolitholapaxy (1 cm) 2. Transurethral incision of the prostate  SURGEON:  Claybon Jabs  INDICATION: Kevin Suarez is a 74 year old male who has irritative voiding symptoms and was found to have multiple bladder calculi. In addition he has known history of BPH and has had cystolitholapaxy in the past. At that time I performed a transurethral incision of his prostate but he unfortunately has redeveloped stones so we discussed treating the stones and reevaluating his outlet at the time.  ANESTHESIA:  General  EBL:  Minimal  DRAINS: None  LOCAL MEDICATIONS USED:  None  SPECIMEN:  Stones taken for composition analysis  Description of procedure: After informed consent the patient was taken to the operating room and placed on the table in a supine position. General anesthesia was then administered. Once fully anesthetized the patient was moved to the dorsal lithotomy position and the genitalia were sterilely prepped and draped in standard fashion. An official timeout was then performed.  The 23 French cystoscope with 30 lens was passed under direct vision down the urethra which is noted be normal. The prostatic urethra revealed elongation with lobar hypertrophy and a relatively high bladder neck. This was photographed. I then entered the bladder and noted 2+ trabeculation. Stones were noted on the floor of the bladder and the ureteral orifice on right and left sides were noted to be of normal configuration and position. The bladder was fully and systematically inspected and noted be free of any lesions or other worrisome finding.  The 1000  holmium fiber was then passed through the cystoscope and used to fragment the stones. I then used a Scientist, product/process development to remove all of the stone fragments from the bladder. Reinspection  revealed no injury to the bladder mucosa and all stones had been completely removed from the bladder.  I removed the cystoscope and inserted the 26 French resectoscope sheath, resectoscope element and the button electrode. This was used to then make an incision at the bladder neck and I carried this back through the floor of the prostate to the level of the veru. I also vaporized tissue to the right and left sides of this trough to widen it and deepened it. I then fulgurated all bleeding points. Reinspection of the prostatic urethra revealed greatly reduced obstruction and this was photographed. I therefore drained the bladder, removed the resectoscope and the patient was awakened and taken to the recovery room in stable and satisfactory condition. He tolerated procedure well no intraoperative complications.    PLAN OF CARE: Discharge to home after PACU  PATIENT DISPOSITION:  PACU - hemodynamically stable.

## 2015-10-15 NOTE — Anesthesia Postprocedure Evaluation (Signed)
Anesthesia Post Note  Patient: Kevin Suarez  Procedure(s) Performed: Procedure(s) (LRB): CYSTOSCOPY WITH LITHOLAPAXY, HOLMIUM LASER LITHOTRIPSY tranurethral excision of prostate (N/A) HOLMIUM LASER APPLICATION (N/A)  Patient location during evaluation: PACU Anesthesia Type: General Level of consciousness: awake and alert Pain management: pain level controlled Vital Signs Assessment: post-procedure vital signs reviewed and stable Respiratory status: spontaneous breathing, nonlabored ventilation, respiratory function stable and patient connected to nasal cannula oxygen Cardiovascular status: blood pressure returned to baseline and stable Postop Assessment: no signs of nausea or vomiting Anesthetic complications: no    Last Vitals:  Filed Vitals:   10/15/15 1015 10/15/15 1130  BP: 111/66 143/71  Pulse: 68 52  Temp:  36.4 C  Resp: 18 16    Last Pain:  Filed Vitals:   10/15/15 1133  PainSc: 7                  Carlitos Bottino L

## 2015-10-16 ENCOUNTER — Encounter (HOSPITAL_COMMUNITY): Payer: Self-pay | Admitting: Emergency Medicine

## 2015-10-16 ENCOUNTER — Emergency Department (HOSPITAL_COMMUNITY)
Admission: EM | Admit: 2015-10-16 | Discharge: 2015-10-16 | Disposition: A | Discharge status: Home / Self Care | Payer: Medicare HMO | Source: Home / Self Care | Attending: Emergency Medicine | Admitting: Emergency Medicine

## 2015-10-16 DIAGNOSIS — Z87442 Personal history of urinary calculi: Secondary | ICD-10-CM | POA: Insufficient documentation

## 2015-10-16 DIAGNOSIS — I10 Essential (primary) hypertension: Secondary | ICD-10-CM | POA: Insufficient documentation

## 2015-10-16 DIAGNOSIS — Z85118 Personal history of other malignant neoplasm of bronchus and lung: Secondary | ICD-10-CM | POA: Insufficient documentation

## 2015-10-16 DIAGNOSIS — Z87438 Personal history of other diseases of male genital organs: Secondary | ICD-10-CM | POA: Insufficient documentation

## 2015-10-16 DIAGNOSIS — E119 Type 2 diabetes mellitus without complications: Secondary | ICD-10-CM | POA: Insufficient documentation

## 2015-10-16 DIAGNOSIS — Z7982 Long term (current) use of aspirin: Secondary | ICD-10-CM

## 2015-10-16 DIAGNOSIS — Z973 Presence of spectacles and contact lenses: Secondary | ICD-10-CM

## 2015-10-16 DIAGNOSIS — Z98811 Dental restoration status: Secondary | ICD-10-CM

## 2015-10-16 DIAGNOSIS — R339 Retention of urine, unspecified: Secondary | ICD-10-CM | POA: Insufficient documentation

## 2015-10-16 DIAGNOSIS — Z87891 Personal history of nicotine dependence: Secondary | ICD-10-CM | POA: Insufficient documentation

## 2015-10-16 DIAGNOSIS — Z77090 Contact with and (suspected) exposure to asbestos: Secondary | ICD-10-CM

## 2015-10-16 DIAGNOSIS — Z79899 Other long term (current) drug therapy: Secondary | ICD-10-CM | POA: Insufficient documentation

## 2015-10-16 LAB — URINE MICROSCOPIC-ADD ON
Bacteria, UA: NONE SEEN
Squamous Epithelial / LPF: NONE SEEN

## 2015-10-16 LAB — URINALYSIS, ROUTINE W REFLEX MICROSCOPIC
BILIRUBIN URINE: NEGATIVE
GLUCOSE, UA: NEGATIVE mg/dL
KETONES UR: NEGATIVE mg/dL
Nitrite: POSITIVE — AB
PROTEIN: 100 mg/dL — AB
Specific Gravity, Urine: 1.012 (ref 1.005–1.030)
pH: 5.5 (ref 5.0–8.0)

## 2015-10-16 NOTE — ED Notes (Signed)
Leg bag attached, given urinary drainage bag for night time. Education on both given with understanding expressed by pt and wife.

## 2015-10-16 NOTE — ED Notes (Signed)
Pt. Stated, I had some kidney stones yesterday and was sent home and last night I was unable to urinate around 900pm, Catheter inserted and 700 cc . Pt. Stated, what a relief.

## 2015-10-16 NOTE — Discharge Instructions (Signed)
Call Dr. Simone Curia office on Monday to arrange a follow-up appointment.  Return to the emergency department if symptoms significantly worsen or change.   Acute Urinary Retention, Male Acute urinary retention is the temporary inability to urinate. This is a common problem in older men. As men age their prostates become larger and block the flow of urine from the bladder. This is usually a problem that has come on gradually.  HOME CARE INSTRUCTIONS If you are sent home with a Foley catheter and a drainage system, you will need to discuss the best course of action with your health care provider. While the catheter is in, maintain a good intake of fluids. Keep the drainage bag emptied and lower than your catheter. This is so that contaminated urine will not flow back into your bladder, which could lead to a urinary tract infection. There are two main types of drainage bags. One is a large bag that usually is used at night. It has a good capacity that will allow you to sleep through the night without having to empty it. The second type is called a leg bag. It has a smaller capacity, so it needs to be emptied more frequently. However, the main advantage is that it can be attached by a leg strap and can go underneath your clothing, allowing you the freedom to move about or leave your home. Only take over-the-counter or prescription medicines for pain, discomfort, or fever as directed by your health care provider.  SEEK MEDICAL CARE IF:  You develop a low-grade fever.  You experience spasms or leakage of urine with the spasms. SEEK IMMEDIATE MEDICAL CARE IF:   You develop chills or fever.  Your catheter stops draining urine.  Your catheter falls out.  You start to develop increased bleeding that does not respond to rest and increased fluid intake. MAKE SURE YOU:  Understand these instructions.  Will watch your condition.  Will get help right away if you are not doing well or get worse.     This information is not intended to replace advice given to you by your health care provider. Make sure you discuss any questions you have with your health care provider.   Document Released: 02/05/2001 Document Revised: 03/16/2015 Document Reviewed: 04/10/2013 Elsevier Interactive Patient Education [employer].

## 2015-10-16 NOTE — ED Provider Notes (Signed)
CSN: [id number]     Arrival date & time 10/16/15  [id number] History   First MD Initiated Contact with Patient 10/16/15 0914     Chief Complaint  Patient presents with  . Urinary Retention     (Consider location/radiation/quality/duration/timing/severity/associated sxs/prior Treatment) HPI Comments: Patient is a 74 year old male with history of bladder calculi. He underwent a procedure yesterday with Dr. Karsten Ro to remove these. After returning home he has been unable to void. Yesterday evening he was quite uncomfortable and presents this morning with no urine output. He denies any fevers or chills. He reports suprapubic distention and fullness, however no abdominal pains.  The history is provided by the patient.    Past Medical History  Diagnosis Date  . Hyperlipidemia   . BPH (benign prostatic hypertrophy)   . Bladder stone   . Type 2 diabetes mellitus ([place])   . Lung nodules     LAST CT 2012  PER DR WRIGHT NOTE NO FURTHER WORK-UP (PULMOLOGIST)  . History of kidney stones   . Hypertension   . Frequency of urination   . 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 Nephrolithiasis     bilateral  . Wears glasses   . Wears dentures     upper  . Wears partial dentures    Past Surgical History  Procedure Laterality Date  . Wrist surgery Left 2005    INJURY  . Total hip arthroplasty Bilateral 2000  &  2005  . Cataract extraction w/ intraocular lens  implant, bilateral    . Cystoscopy with litholapaxy N/A 11/28/2013    Procedure: CYSTOSCOPY WITH stone retrieval;  Surgeon: Claybon Jabs, MD;  Location[facility];  Service: Urology;  Laterality: N/A;  . Transurethral resection of prostate  11/28/2013    Procedure: TRANSURETHRAL RESECTION OF THE PROSTATE WITH  BUTTON GYRUS INSTRUMENTS;  Surgeon: Claybon Jabs, MD;  Location: [facility];  Service: Urology;;  . Extracorporeal shock wave lithotripsy Right 05-13-2015   Family History  Problem Relation Age of Onset  . Cancer  Mother   . Cancer Maternal Grandmother   . Heart attack Paternal Grandfather   . Coronary artery disease Father   . Diabetes Father    Social History  Substance Use Topics  . Smoking status: Former Smoker -- 2.00 packs/day for 30 years    Types: Cigarettes    Quit date: 11/14/1995  . Smokeless tobacco: Never Used  . Alcohol Use: No    Review of Systems  All other systems reviewed and are negative.     Allergies  Prednisone  Home Medications   Prior to Admission medications   Medication Sig Start Date End Date Taking? Authorizing Provider  aspirin EC 81 MG tablet Take 81 mg by mouth daily.    Historical Provider, MD  HYDROcodone-acetaminophen (NORCO) 10-325 MG tablet Take 1-2 tablets by mouth every 4 (four) hours as needed for moderate pain. Maximum dose per 24 hours - 8 pills 10/15/15   [name], MD  losartan (COZAAR) 50 MG tablet Take 50 mg by mouth every morning.    Historical Provider, MD  metFORMIN (GLUCOPHAGE) 500 MG tablet Take 500 mg by mouth 2 (two) times daily with a meal.     Historical Provider, MD  naproxen sodium (ANAPROX) 220 MG tablet Take 220-400 mg by mouth 2 (two) times daily as needed (pain.).    Historical Provider, MD  phenazopyridine (PYRIDIUM) 200 MG tablet Take 1 tablet (200 mg total) by mouth 3 (  three) times daily as needed for pain. 10/15/15   [name], MD   BP 145/72 mmHg  Pulse 125  Temp(Src) 98.1 F (36.7 C)  Resp 24  Ht 5\' 10"  (1.778 m)  Wt 233 lb (105.688 kg)  BMI 33.43 kg/m2  SpO2 95% Physical Exam  Constitutional: He is oriented to person, place, and time. He appears well-developed and well-nourished. No distress.  HENT:  Head: Normocephalic and atraumatic.  Neck: Normal range of motion. Neck supple.  Cardiovascular: Normal rate, regular rhythm and normal heart sounds.   No murmur heard. Pulmonary/Chest: Effort normal and breath sounds normal. No respiratory distress. He has no wheezes. He has no rales.  Abdominal: Soft. Bowel  sounds are normal. He exhibits no distension. There is no tenderness.  Musculoskeletal: Normal range of motion. He exhibits no edema.  Neurological: He is alert and oriented to person, place, and time.  Skin: Skin is warm and dry. He is not diaphoretic.  Nursing note and vitals reviewed.   ED Course  Procedures (including critical care time) Labs Review Labs Reviewed  URINALYSIS, ROUTINE W REFLEX MICROSCOPIC (NOT AT [facility])    Imaging Review No results found. I have personally reviewed and evaluated these images and lab results as part of my medical decision-making.   EKG Interpretation None      MDM   Final diagnoses:  None    A Foley catheter was placed with approximately 700 mL of urine output. Urinalysis reveals no evidence for infection. The patient was given the option of removing the Foley catheter or leaving it in place until he follows up with urology next week. He is concerned that he may experience another episode of urinary retention if the catheter is removed and elects to leave it in place. He is to call his urologist on Monday to arrange a follow-up appointment.    Veryl Speak, MD 10/16/15 1010

## 2015-10-18 ENCOUNTER — Encounter (HOSPITAL_BASED_OUTPATIENT_CLINIC_OR_DEPARTMENT_OTHER): Payer: Self-pay | Admitting: Urology

## 2015-10-19 ENCOUNTER — Other Ambulatory Visit: Payer: Self-pay

## 2015-10-19 ENCOUNTER — Emergency Department (HOSPITAL_COMMUNITY): Payer: Medicare HMO

## 2015-10-19 ENCOUNTER — Other Ambulatory Visit (HOSPITAL_COMMUNITY): Payer: Self-pay

## 2015-10-19 ENCOUNTER — Inpatient Hospital Stay (HOSPITAL_COMMUNITY)
Admission: EM | Admit: 2015-10-19 | Discharge: 2015-10-23 | DRG: 698 | Disposition: A | Discharge status: Home / Self Care | Payer: Medicare HMO | Attending: Internal Medicine | Admitting: Internal Medicine

## 2015-10-19 ENCOUNTER — Encounter (HOSPITAL_COMMUNITY): Payer: Self-pay | Admitting: Emergency Medicine

## 2015-10-19 DIAGNOSIS — Z6834 Body mass index (BMI) 34.0-34.9, adult: Secondary | ICD-10-CM

## 2015-10-19 DIAGNOSIS — N390 Urinary tract infection, site not specified: Secondary | ICD-10-CM | POA: Diagnosis present

## 2015-10-19 DIAGNOSIS — N3281 Overactive bladder: Secondary | ICD-10-CM | POA: Diagnosis not present

## 2015-10-19 DIAGNOSIS — N401 Enlarged prostate with lower urinary tract symptoms: Secondary | ICD-10-CM | POA: Diagnosis not present

## 2015-10-19 DIAGNOSIS — T83511A Infection and inflammatory reaction due to indwelling urethral catheter, initial encounter: Secondary | ICD-10-CM | POA: Diagnosis not present

## 2015-10-19 DIAGNOSIS — D696 Thrombocytopenia, unspecified: Secondary | ICD-10-CM | POA: Diagnosis present

## 2015-10-19 DIAGNOSIS — Z87891 Personal history of nicotine dependence: Secondary | ICD-10-CM

## 2015-10-19 DIAGNOSIS — A419 Sepsis, unspecified organism: Secondary | ICD-10-CM | POA: Diagnosis present

## 2015-10-19 DIAGNOSIS — Z9842 Cataract extraction status, left eye: Secondary | ICD-10-CM | POA: Diagnosis not present

## 2015-10-19 DIAGNOSIS — N200 Calculus of kidney: Secondary | ICD-10-CM | POA: Diagnosis not present

## 2015-10-19 DIAGNOSIS — N201 Calculus of ureter: Secondary | ICD-10-CM | POA: Diagnosis present

## 2015-10-19 DIAGNOSIS — R112 Nausea with vomiting, unspecified: Secondary | ICD-10-CM | POA: Diagnosis not present

## 2015-10-19 DIAGNOSIS — Y92019 Unspecified place in single-family (private) house as the place of occurrence of the external cause: Secondary | ICD-10-CM | POA: Diagnosis not present

## 2015-10-19 DIAGNOSIS — E1165 Type 2 diabetes mellitus with hyperglycemia: Secondary | ICD-10-CM | POA: Diagnosis present

## 2015-10-19 DIAGNOSIS — Z77090 Contact with and (suspected) exposure to asbestos: Secondary | ICD-10-CM | POA: Diagnosis present

## 2015-10-19 DIAGNOSIS — D6489 Other specified anemias: Secondary | ICD-10-CM | POA: Diagnosis not present

## 2015-10-19 DIAGNOSIS — Z833 Family history of diabetes mellitus: Secondary | ICD-10-CM | POA: Diagnosis not present

## 2015-10-19 DIAGNOSIS — R7881 Bacteremia: Secondary | ICD-10-CM | POA: Diagnosis not present

## 2015-10-19 DIAGNOSIS — N179 Acute kidney failure, unspecified: Secondary | ICD-10-CM | POA: Diagnosis not present

## 2015-10-19 DIAGNOSIS — R51 Headache: Secondary | ICD-10-CM | POA: Diagnosis not present

## 2015-10-19 DIAGNOSIS — I10 Essential (primary) hypertension: Secondary | ICD-10-CM | POA: Diagnosis not present

## 2015-10-19 DIAGNOSIS — A4159 Other Gram-negative sepsis: Secondary | ICD-10-CM | POA: Diagnosis not present

## 2015-10-19 DIAGNOSIS — N138 Other obstructive and reflux uropathy: Secondary | ICD-10-CM | POA: Diagnosis not present

## 2015-10-19 DIAGNOSIS — Z961 Presence of intraocular lens: Secondary | ICD-10-CM | POA: Diagnosis present

## 2015-10-19 DIAGNOSIS — Z7984 Long term (current) use of oral hypoglycemic drugs: Secondary | ICD-10-CM | POA: Diagnosis not present

## 2015-10-19 DIAGNOSIS — Z888 Allergy status to other drugs, medicaments and biological substances status: Secondary | ICD-10-CM

## 2015-10-19 DIAGNOSIS — E785 Hyperlipidemia, unspecified: Secondary | ICD-10-CM | POA: Diagnosis not present

## 2015-10-19 DIAGNOSIS — N400 Enlarged prostate without lower urinary tract symptoms: Secondary | ICD-10-CM | POA: Diagnosis not present

## 2015-10-19 DIAGNOSIS — D649 Anemia, unspecified: Secondary | ICD-10-CM | POA: Diagnosis present

## 2015-10-19 DIAGNOSIS — E876 Hypokalemia: Secondary | ICD-10-CM | POA: Diagnosis not present

## 2015-10-19 DIAGNOSIS — Z87442 Personal history of urinary calculi: Secondary | ICD-10-CM

## 2015-10-19 DIAGNOSIS — R531 Weakness: Secondary | ICD-10-CM | POA: Diagnosis not present

## 2015-10-19 DIAGNOSIS — Z9841 Cataract extraction status, right eye: Secondary | ICD-10-CM

## 2015-10-19 DIAGNOSIS — Z96643 Presence of artificial hip joint, bilateral: Secondary | ICD-10-CM | POA: Diagnosis present

## 2015-10-19 DIAGNOSIS — R404 Transient alteration of awareness: Secondary | ICD-10-CM | POA: Diagnosis not present

## 2015-10-19 DIAGNOSIS — Z8249 Family history of ischemic heart disease and other diseases of the circulatory system: Secondary | ICD-10-CM | POA: Diagnosis not present

## 2015-10-19 DIAGNOSIS — E669 Obesity, unspecified: Secondary | ICD-10-CM | POA: Diagnosis present

## 2015-10-19 DIAGNOSIS — E119 Type 2 diabetes mellitus without complications: Secondary | ICD-10-CM

## 2015-10-19 DIAGNOSIS — Z7982 Long term (current) use of aspirin: Secondary | ICD-10-CM

## 2015-10-19 DIAGNOSIS — N12 Tubulo-interstitial nephritis, not specified as acute or chronic: Secondary | ICD-10-CM | POA: Diagnosis present

## 2015-10-19 DIAGNOSIS — N10 Acute tubulo-interstitial nephritis: Secondary | ICD-10-CM

## 2015-10-19 LAB — URINE MICROSCOPIC-ADD ON

## 2015-10-19 LAB — COMPREHENSIVE METABOLIC PANEL
ALBUMIN: 3.4 g/dL — AB (ref 3.5–5.0)
ALT: 17 U/L (ref 17–63)
AST: 23 U/L (ref 15–41)
Alkaline Phosphatase: 93 U/L (ref 38–126)
Anion gap: 11 (ref 5–15)
BILIRUBIN TOTAL: 1 mg/dL (ref 0.3–1.2)
BUN: 20 mg/dL (ref 6–20)
CALCIUM: 9.2 mg/dL (ref 8.9–10.3)
CHLORIDE: 105 mmol/L (ref 101–111)
CO2: 21 mmol/L — ABNORMAL LOW (ref 22–32)
CREATININE: 1.37 mg/dL — AB (ref 0.61–1.24)
GFR calc Af Amer: 57 mL/min — ABNORMAL LOW (ref >60)
GFR, EST NON AFRICAN AMERICAN: 49 mL/min — AB (ref >60)
Glucose, Bld: 160 mg/dL — ABNORMAL HIGH (ref 65–99)
Potassium: 3.4 mmol/L — ABNORMAL LOW (ref 3.5–5.1)
SODIUM: 137 mmol/L (ref 135–145)
Total Protein: 6.2 g/dL — ABNORMAL LOW (ref 6.5–8.1)

## 2015-10-19 LAB — URINALYSIS, ROUTINE W REFLEX MICROSCOPIC
Glucose, UA: NEGATIVE mg/dL
KETONES UR: NEGATIVE mg/dL
NITRITE: NEGATIVE
PROTEIN: 100 mg/dL — AB
Specific Gravity, Urine: 1.018 (ref 1.005–1.030)
pH: 5.5 (ref 5.0–8.0)

## 2015-10-19 LAB — CREATININE, URINE, RANDOM: CREATININE, URINE: 220.9 mg/dL

## 2015-10-19 LAB — CBC WITH DIFFERENTIAL/PLATELET
Basophils Absolute: 0 10*3/uL (ref 0.0–0.1)
Basophils Relative: 0 %
EOS ABS: 0 10*3/uL (ref 0.0–0.7)
EOS PCT: 0 %
HCT: 35.3 % — ABNORMAL LOW (ref 39.0–52.0)
Hemoglobin: 11.8 g/dL — ABNORMAL LOW (ref 13.0–17.0)
LYMPHS ABS: 0.5 10*3/uL — AB (ref 0.7–4.0)
Lymphocytes Relative: 4 %
MCH: 28.4 pg (ref 26.0–34.0)
MCHC: 33.4 g/dL (ref 30.0–36.0)
MCV: 84.9 fL (ref 78.0–100.0)
MONOS PCT: 0 %
Monocytes Absolute: 0.1 10*3/uL (ref 0.1–1.0)
Neutro Abs: 12.8 10*3/uL — ABNORMAL HIGH (ref 1.7–7.7)
Neutrophils Relative %: 96 %
PLATELETS: 142 10*3/uL — AB (ref 150–400)
RBC: 4.16 MIL/uL — AB (ref 4.22–5.81)
RDW: 12.9 % (ref 11.5–15.5)
WBC: 13.5 10*3/uL — AB (ref 4.0–10.5)

## 2015-10-19 LAB — GLUCOSE, CAPILLARY: Glucose-Capillary: 148 mg/dL — ABNORMAL HIGH (ref 65–99)

## 2015-10-19 LAB — I-STAT CG4 LACTIC ACID, ED: LACTIC ACID, VENOUS: 2.94 mmol/L — AB (ref 0.5–2.0)

## 2015-10-19 LAB — SODIUM, URINE, RANDOM: Sodium, Ur: 51 mmol/L

## 2015-10-19 MED ORDER — TAMSULOSIN HCL 0.4 MG PO CAPS
0.4000 mg | ORAL_CAPSULE | Freq: Every day | ORAL | Status: DC
  Administered 2015-10-19 – 2015-10-22 (×4): 0.4 mg via ORAL
  Filled 2015-10-19 (×4): qty 1

## 2015-10-19 MED ORDER — ACETAMINOPHEN 650 MG RE SUPP: 650.0000 mg | Freq: Four times a day (QID) | RECTAL | Status: DC | PRN

## 2015-10-19 MED ORDER — ONDANSETRON HCL 4 MG PO TABS: 4.0000 mg | ORAL_TABLET | Freq: Four times a day (QID) | ORAL | Status: DC | PRN

## 2015-10-19 MED ORDER — DEXTROSE 5 % IV SOLN
1.0000 g | Freq: Once | INTRAVENOUS | Status: AC
  Administered 2015-10-19: 1 g via INTRAVENOUS
  Filled 2015-10-19: qty 10

## 2015-10-19 MED ORDER — SODIUM CHLORIDE 0.9 % IV SOLN
INTRAVENOUS | Status: DC
  Administered 2015-10-19 – 2015-10-22 (×7): via INTRAVENOUS

## 2015-10-19 MED ORDER — INSULIN ASPART 100 UNIT/ML ~~LOC~~ SOLN: 0.0000 [IU] | Freq: Every day | SUBCUTANEOUS | Status: DC

## 2015-10-19 MED ORDER — ONDANSETRON HCL 4 MG/2ML IJ SOLN
4.0000 mg | Freq: Four times a day (QID) | INTRAMUSCULAR | Status: DC | PRN
  Filled 2015-10-19: qty 2

## 2015-10-19 MED ORDER — SENNOSIDES-DOCUSATE SODIUM 8.6-50 MG PO TABS
1.0000 | ORAL_TABLET | Freq: Every evening | ORAL | Status: DC | PRN
  Administered 2015-10-23: 1 via ORAL
  Filled 2015-10-19: qty 1

## 2015-10-19 MED ORDER — SODIUM CHLORIDE 0.9 % IV BOLUS (SEPSIS)
1000.0000 mL | INTRAVENOUS | Status: AC
  Administered 2015-10-19 (×3): 1000 mL via INTRAVENOUS

## 2015-10-19 MED ORDER — ACETAMINOPHEN 325 MG PO TABS
650.0000 mg | ORAL_TABLET | Freq: Four times a day (QID) | ORAL | Status: DC | PRN
  Administered 2015-10-20 – 2015-10-22 (×5): 650 mg via ORAL
  Filled 2015-10-19 (×5): qty 2

## 2015-10-19 MED ORDER — ENOXAPARIN SODIUM 40 MG/0.4ML ~~LOC~~ SOLN
40.0000 mg | SUBCUTANEOUS | Status: DC
  Administered 2015-10-20 – 2015-10-23 (×4): 40 mg via SUBCUTANEOUS
  Filled 2015-10-19 (×5): qty 0.4

## 2015-10-19 MED ORDER — INSULIN ASPART 100 UNIT/ML ~~LOC~~ SOLN
0.0000 [IU] | Freq: Three times a day (TID) | SUBCUTANEOUS | Status: DC
  Administered 2015-10-20 – 2015-10-21 (×4): 1 [IU] via SUBCUTANEOUS

## 2015-10-19 MED ORDER — HYDROCODONE-ACETAMINOPHEN 5-325 MG PO TABS: 1.0000 | ORAL_TABLET | ORAL | Status: DC | PRN

## 2015-10-19 MED ORDER — DEXTROSE 5 % IV SOLN
2.0000 g | Freq: Three times a day (TID) | INTRAVENOUS | Status: AC
  Administered 2015-10-19 – 2015-10-22 (×10): 2 g via INTRAVENOUS
  Filled 2015-10-19 (×10): qty 2

## 2015-10-19 MED ORDER — ASPIRIN EC 81 MG PO TBEC
81.0000 mg | DELAYED_RELEASE_TABLET | Freq: Every day | ORAL | Status: DC
  Administered 2015-10-20 – 2015-10-23 (×4): 81 mg via ORAL
  Filled 2015-10-19 (×4): qty 1

## 2015-10-19 MED ORDER — ACETAMINOPHEN 325 MG PO TABS
650.0000 mg | ORAL_TABLET | Freq: Once | ORAL | Status: AC
  Administered 2015-10-19: 650 mg via ORAL
  Filled 2015-10-19: qty 2

## 2015-10-19 MED ORDER — SODIUM CHLORIDE 0.9 % IJ SOLN
3.0000 mL | Freq: Two times a day (BID) | INTRAMUSCULAR | Status: DC
  Administered 2015-10-20: 3 mL via INTRAVENOUS

## 2015-10-19 MED ORDER — SODIUM CHLORIDE 0.9 % IV BOLUS (SEPSIS)
500.0000 mL | INTRAVENOUS | Status: AC
  Administered 2015-10-19: 500 mL via INTRAVENOUS

## 2015-10-19 NOTE — ED Notes (Signed)
Pt output 20ML. Nurse and Dr. Regenia Skeeter notified.

## 2015-10-19 NOTE — H&P (Signed)
History and Physical  Patient Name: Kevin Suarez     [id number]    DOB: [birth year]    DOA: 10/19/2015 Referring physician: Sherwood Gambler, MD PCP: Haywood Pao, MD      Chief Complaint: Fever, dysuria, recent renal stone removal  HPI: Kevin Suarez is a 74 y.o. male with a past medical history significant for NIDDM and HTN and recurrent nephrolithiasis who presents with fever and dysuria after stone removal.  The patient had a cystolitholapaxy and transurethral prostate incision last Friday, was in hospital overnight with foley, which was removed before discharge.  Since discharge, the patient notes suprapubic pain severe, dysuria and stinging with urination, urinary urgency, and difficulty emptying. This evening he developed fever and so he came to the ER.  In the ED, the patient was febrile, tachycardic, and had soft blood pressure urine he also had leukocytosis, elevated lactic acid, and AKI. Chest x-ray was clear. CT renal protocol showed no hydronephrosis or drainable fluid collection. TRH were asked to admit for sepsis with urinary source.     Review of Systems:  All other systems negative except as just noted or noted in the history of present illness.   Allergies: Prednisone, intolerance.   Home medications: 1. Aspirin 81 mg daily 2. Losartan 50 mg daily 3. Metformin 500 mg twice daily 4. Tamsulosin 0.4 g daily  Past medical history: 1. Nephrolithiasis, 5 times 2. NIDDM 3. HTN 4. BPH  Past surgical history: 1. Bilateral total hip arthroplasty 2. Wrist surgery 3. Transurethral resection of the prostate 4. Cystoscopy with stone removal in 2015 and then again 5 days ago 5. Again prostate incision transurethrally 5 days ago  Family history:  Mother, cancer, unknown type. Father, coronary disease, diabetes No family history of renal disease.  Social History:  Patient lives with his wife and daughter. He is a remote former smoker. He grew up in  [place] and went to [employer]. He ambulates without assistive device. He drives as recently as this morning to go to the doctor's office. He is a former Development worker, community.        Physical Exam: BP 109/58 mmHg  Pulse 116  Temp(Src) 99.3 F (37.4 C) (Oral)  Resp 33  Wt 105.688 kg (233 lb)  SpO2 95% General appearance: Well-developed, older adult male, somewhat dazed but in no acute distress.  Smells of urine. Eyes: Anicteric, lids and lashes normal.     ENT: No nasal deformity, discharge, or epistaxis.  OP moist without lesions.   Skin: Warm and flushed, no lesions noted. Cardiac: Tachycardic, nl S1-S2, no murmurs appreciated.  Capillary refill is brisk.  No LE edema.   Respiratory: Normal respiratory rate and rhythm.  CTAB without rales or wheezes. Abdomen: Abdomen soft without rigidity.  Moderate TTP suprapubic. No ascites, distension.  No CVA tenderness. MSK: No deformities or effusions. Neuro: Sensorium intact and responding to questions, but somewhat slow to respond. Speech is fluent.  Moves all extremities equally and with normal coordination.   Cranial nerves 3-12 intact. Psych: Behavior appropriate.  Affect normal.  No evidence of aural or visual hallucinations or delusions.       Labs on Admission:  The metabolic panel shows mild hypokalemia, acidosis with normal anion gap, serum creatinine 1.37 mg/dL from a previous of 0.8 mg/dL one year ago. Mild hyperglycemia. Lactic acid 2.94 mmol per liter. Blood cultures pending. Urine culture pending. Urinalysis shows full field bacteria and W BCs. The complete blood count shows leukocytosis, anemia  slightly worse than before, mild thrombocytopenia.   Radiological Exams on Admission: Personally reviewed: Dg Chest Port 1 View  10/19/2015  CLINICAL DATA:  Acute onset of nausea and vomiting. Initial encounter. EXAM: PORTABLE CHEST 1 VIEW COMPARISON:  Chest radiograph performed 09/20/2011, and CT of the chest performed 10/25/2011  FINDINGS: The lungs are well-aerated. Mild pleural calcification is again noted at the right lung base. There is no evidence of focal opacification, pleural effusion or pneumothorax. The cardiomediastinal silhouette is within normal limits. No acute osseous abnormalities are seen. IMPRESSION: 1. No acute cardiopulmonary process seen. 2. Mild pleural calcification again noted at the right lung base, raising question for prior asbestos exposure. Electronically Signed   By: Garald Balding M.D.   On: 10/19/2015 19:10   Ct Renal Stone Study  10/19/2015  CLINICAL DATA:  Back pain. Kidney stone removed 4 days ago with catheter placement, and subsequent catheter removal. Urinary tract infection. EXAM: CT ABDOMEN AND PELVIS WITHOUT CONTRAST TECHNIQUE: Multidetector CT imaging of the abdomen and pelvis was performed following the standard protocol without IV contrast. COMPARISON:  04/28/2015 FINDINGS: Lower chest: Calcified pleural plaques along both hemidiaphragms and along the posterior pleural surfaces compatible with prior asbestos exposure. No fibrosis in the lung bases. Hepatobiliary: Several small hepatic hypodense lesions including a 0.9 by 0.8 cm hypodense lesion in the left hepatic lobe on image 12 series 2 appears stable. No biliary dilatation. Gallbladder unremarkable. Pancreas: Unremarkable Spleen: Unremarkable Adrenals/Urinary Tract: Adrenal glands normal. 1-2 mm left kidney lower pole nonobstructive calculus, image 68 series 5. No other stones observed. No hydronephrosis or hydroureter. Non rotated right kidney. Mild urinary bladder wall thickening posteriorly and laterally. The floor of the urinary bladder is obscured by streak artifact from the patient' s bilateral hip implants. Stomach/Bowel: Sigmoid colon diverticulosis. Part of the sigmoid colon extends into the indirect left inguinal hernia which also contains adipose tissue. No findings of strangulation or obstruction. Scattered diverticula in the  descending colon and rest of the colon. Appendix normal. No active diverticulitis identified. Vascular/Lymphatic: Aortoiliac atherosclerotic vascular disease. Small retroperitoneal lymph nodes are observed. Mildly enlarged right external iliac lymph nodes including a 1.1 cm right-sided node on image 66 series 2 (formerly 0.9 cm). Additional borderline enlarged pelvic lymph nodes are present including a 1.1 cm left common iliac lymph node on image 64 series 5. Reproductive: This probably a small punctate central calcification in the prostate gland on image 75 of series 2 and my sense is that the prostate is probably enlarged, although obscured by streak artifact. Other: No supplemental non-categorized findings. Musculoskeletal: Lumbar spondylosis and degenerative disc disease with notable foraminal impingement on the right at L5-S1 and on the left at L4-5 and L5-S1. Suspected central narrowing of the thecal sac at L2-3 and L4-5. IMPRESSION: 1. Wall thickening in the urinary bladder suspicious for cystitis. Mild pelvic lymph node enlargement, probably reactive. 2. 1-2 mm left kidney lower pole nonobstructive calculus. No hydronephrosis or hydroureter. 3. Portions the urinary bladder and prostate gland are obscured by streak artifact from the patient' s hip implants. 4. Sigmoid colon diverticulosis, with part of the sigmoid colon extending into the indirect left inguinal hernia (no strangulation or obstruction). 5.  Aortoiliac atherosclerotic vascular disease. 6. Lumbar spondylosis and degenerative disc disease causing impingement at L4-5, L5-S 1, and L2-3. 7. 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 Electronically Signed   By: Van Clines M.D.   On: 10/19/2015 20:10    EKG: Independently reviewed. Sinus tachycardia, rate 121.  Assessment/Plan 1. Sepsis:  This is new.  Suspected source urine. Organism unknown. Patient meets criteria given tachycardia, fever, leukocytosis, and  evidence of organ dysfunction.  Blood and urine cultures drawn.  Lactate exceeds 2 mmol/L and repeat ordered within 6 hours.  MAP > 65 mmHg. -Ceftazidime, renally dosed -30 ml/kg bolus given in ED, will repeat lactic acid -Admit to step down with telemetry -Follow urine culture and blood culture    2. AKI:  -Hold metforin and ARB -Urine electrolytes -Fluids and trend BMP  3. Hypokalemia:  -Trend and defer supplement in setting of AKI  4. Anemia:  Chronic and similar to previous.  No source of bleeding clinically.  5. Thrombocytopenia:  Presumed from sepsis -Trend CBC  6. NIDDM:  Stable.  -Hold metformin -Sliding scale corrections as needed  7. HTN:  Hypotensive relatively at admission. -Hold losartan for now     DVT PPx: Lovenox Diet: Carb modified Consultants: Urology Code Status: Full Family Communication: None  Medical decision making: What exists of the patient's previous chart was reviewed in depth and the case was discussed with Dr. Regenia Skeeter and Dr. Tresa Moore on call from Urology. Patient seen 9:55 PM on 10/19/2015.  Disposition Plan:  Admit to stepdown for IV antibiotics, cultures, and close monitoring.  If hemodynamically stabilized by tomorrow, could transfer to general floor in 1-2 days.      Edwin Dada [facility] Pager [phone]

## 2015-10-19 NOTE — ED Notes (Signed)
Wife Kevin Suarez [phone]) [phone](cell).... Please call for any changes or if needed

## 2015-10-19 NOTE — ED Notes (Signed)
Bladder scanned pt, it read 143 ML. While bladder scanning pt noticed pt had incontinent episode. Nurse was notified.

## 2015-10-19 NOTE — Consult Note (Signed)
Reason for Consult: Urosepsis, Prostatic Hypertrophy s/p Transurthral Incision of Prostate with Cystolithalopexy, nephrolithiasis  Referring Physician: Myrene Buddy MD  Kevin Suarez is an 74 y.o. male.   HPI:   1 - Urosepsis - fever to 102, tachycardia, leukocytosis, bacteruria on ER labs 12/6 on eval for malaise and loss consciousness 4 days after outpatient TUIP. Most recent UCX 09/2015 pre-op negative. He is diabetic. CT on admission without distended bladder / residual bladder sotnes / or worrisome pelvic fluid collections. UCX, [place] obtained and pending, placed on empiric rocephin.   2 - Prostatic Hypertrophy  - s/p transurthral incision of prostate with cystolithalopexy 10/15/2015 for recurrent bladder stones and outlet obstruction. Passed office trial of void 10/18/2015. He had similar procedure a year ago.   3 -  Nephrolithiasis - bilateral punctate non-obstructing stones on imaging x several including ER CT 10/19/15 this admission. No hydro  Today "Kevin Suarez" is seen in consultation for above, specifically for management reccomendations regarding his likely urosepsis.   Past Medical History  Diagnosis Date  . Hyperlipidemia   . BPH (benign prostatic hypertrophy)   . Bladder stone   . Type 2 diabetes mellitus ([place])   . Lung nodules     LAST CT 2012  PER DR WRIGHT NOTE NO FURTHER WORK-UP (PULMOLOGIST)  . History of kidney stones   . Hypertension   . Frequency of urination   . 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 Nephrolithiasis     bilateral  . Wears glasses   . Wears dentures     upper  . Wears partial dentures     Past Surgical History  Procedure Laterality Date  . Wrist surgery Left 2005    INJURY  . Total hip arthroplasty Bilateral 2000  &  2005  . Cataract extraction w/ intraocular lens  implant, bilateral    . Cystoscopy with litholapaxy N/A 11/28/2013    Procedure: CYSTOSCOPY WITH stone retrieval;  Surgeon: Claybon Jabs, MD;  Location: [facility];   Service: Urology;  Laterality: N/A;  . Transurethral resection of prostate  11/28/2013    Procedure: TRANSURETHRAL RESECTION OF THE PROSTATE WITH  BUTTON GYRUS INSTRUMENTS;  Surgeon: Claybon Jabs, MD;  Location: [facility];  Service: Urology;;  . Extracorporeal shock wave lithotripsy Right 05-13-2015  . Cystoscopy with litholapaxy N/A 10/15/2015    Procedure: CYSTOSCOPY WITH LITHOLAPAXY, HOLMIUM LASER LITHOTRIPSY tranurethral excision of prostate;  Surgeon: [name], MD;  Location[facility];  Service: Urology;  Laterality: N/A;  . Holmium laser application N/A 92/02/4627    Procedure: HOLMIUM LASER APPLICATION;  Surgeon: [name], MD;  Location: [facility];  Service: Urology;  Laterality: N/A;    Family History  Problem Relation Age of Onset  . Cancer Mother     Unknown  . Cancer Maternal Grandmother   . Heart attack Paternal Grandfather   . Coronary artery disease Father   . Diabetes Father     Social History:  reports that he quit smoking about 19 years ago. His smoking use included Cigarettes. He has a 60 pack-year smoking history. He has never used smokeless tobacco. He reports that he does not drink alcohol or use illicit drugs.  Allergies:  Allergies  Allergen Reactions  . Prednisone Other (See Comments)    Doesn't like side effects of prednisone.     Medications: I have reviewed the patient's current medications.  Results for orders placed or performed during the hospital encounter of  10/19/15 (from the past 48 hour(s))  I-Stat CG4 Lactic Acid, ED  (not at  [facility])     Status: Abnormal   Collection Time: 10/19/15  6:45 PM  Result Value Ref Range   Lactic Acid, Venous 2.94 (HH) 0.5 - 2.0 mmol/L   Comment NOTIFIED PHYSICIAN   Comprehensive metabolic panel     Status: Abnormal   Collection Time: 10/19/15  6:50 PM  Result Value Ref Range   Sodium 137 135 - 145 mmol/L   Potassium 3.4 (L) 3.5 - 5.1 mmol/L   Chloride 105  101 - 111 mmol/L   CO2 21 (L) 22 - 32 mmol/L   Glucose, Bld 160 (H) 65 - 99 mg/dL   BUN 20 6 - 20 mg/dL   Creatinine, Ser 1.37 (H) 0.61 - 1.24 mg/dL   Calcium 9.2 8.9 - 10.3 mg/dL   Total Protein 6.2 (L) 6.5 - 8.1 g/dL   Albumin 3.4 (L) 3.5 - 5.0 g/dL   AST 23 15 - 41 U/L   ALT 17 17 - 63 U/L   Alkaline Phosphatase 93 38 - 126 U/L   Total Bilirubin 1.0 0.3 - 1.2 mg/dL   GFR calc non Af Amer 49 (L) >60 mL/min   GFR calc Af Amer 57 (L) >60 mL/min    Comment: (NOTE) The eGFR has been calculated using the CKD EPI equation. This calculation has not been validated in all clinical situations. eGFR's persistently <60 mL/min signify possible Chronic Kidney Disease.    Anion gap 11 5 - 15  CBC WITH DIFFERENTIAL     Status: Abnormal   Collection Time: 10/19/15  6:50 PM  Result Value Ref Range   WBC 13.5 (H) 4.0 - 10.5 K/uL   RBC 4.16 (L) 4.22 - 5.81 MIL/uL   Hemoglobin 11.8 (L) 13.0 - 17.0 g/dL   HCT 35.3 (L) 39.0 - 52.0 %   MCV 84.9 78.0 - 100.0 fL   MCH 28.4 26.0 - 34.0 pg   MCHC 33.4 30.0 - 36.0 g/dL   RDW 12.9 11.5 - 15.5 %   Platelets 142 (L) 150 - 400 K/uL   Neutrophils Relative % 96 %   Neutro Abs 12.8 (H) 1.7 - 7.7 K/uL   Lymphocytes Relative 4 %   Lymphs Abs 0.5 (L) 0.7 - 4.0 K/uL   Monocytes Relative 0 %   Monocytes Absolute 0.1 0.1 - 1.0 K/uL   Eosinophils Relative 0 %   Eosinophils Absolute 0.0 0.0 - 0.7 K/uL   Basophils Relative 0 %   Basophils Absolute 0.0 0.0 - 0.1 K/uL  Urinalysis, Routine w reflex microscopic (not at [facility])     Status: Abnormal   Collection Time: 10/19/15  6:59 PM  Result Value Ref Range   Color, Urine ORANGE (A) YELLOW    Comment: BIOCHEMICALS MAY BE AFFECTED BY COLOR   APPearance TURBID (A) CLEAR   Specific Gravity, Urine 1.018 1.005 - 1.030   pH 5.5 5.0 - 8.0   Glucose, UA NEGATIVE NEGATIVE mg/dL   Hgb urine dipstick LARGE (A) NEGATIVE   Bilirubin Urine SMALL (A) NEGATIVE   Ketones, ur NEGATIVE NEGATIVE mg/dL   Protein, ur 100 (A)  NEGATIVE mg/dL   Nitrite NEGATIVE NEGATIVE   Leukocytes, UA LARGE (A) NEGATIVE  Urine microscopic-add on     Status: Abnormal   Collection Time: 10/19/15  6:59 PM  Result Value Ref Range   Squamous Epithelial / LPF 0-5 (A) NONE SEEN   WBC, UA TOO NUMEROUS TO COUNT  0 - 5 WBC/hpf   RBC / HPF TOO NUMEROUS TO COUNT 0 - 5 RBC/hpf   Bacteria, UA MANY (A) NONE SEEN   Urine-Other MUCOUS PRESENT     Dg Chest Port 1 View  10/19/2015  CLINICAL DATA:  Acute onset of nausea and vomiting. Initial encounter. EXAM: PORTABLE CHEST 1 VIEW COMPARISON:  Chest radiograph performed 09/20/2011, and CT of the chest performed 10/25/2011 FINDINGS: The lungs are well-aerated. Mild pleural calcification is again noted at the right lung base. There is no evidence of focal opacification, pleural effusion or pneumothorax. The cardiomediastinal silhouette is within normal limits. No acute osseous abnormalities are seen. IMPRESSION: 1. No acute cardiopulmonary process seen. 2. Mild pleural calcification again noted at the right lung base, raising question for prior asbestos exposure. Electronically Signed   By: Garald Balding M.D.   On: 10/19/2015 19:10   Ct Renal Stone Study  10/19/2015  CLINICAL DATA:  Suarez pain. Kidney stone removed 4 days ago with catheter placement, and subsequent catheter removal. Urinary tract infection. EXAM: CT ABDOMEN AND PELVIS WITHOUT CONTRAST TECHNIQUE: Multidetector CT imaging of the abdomen and pelvis was performed following the standard protocol without IV contrast. COMPARISON:  04/28/2015 FINDINGS: Lower chest: Calcified pleural plaques along both hemidiaphragms and along the posterior pleural surfaces compatible with prior asbestos exposure. No fibrosis in the lung bases. Hepatobiliary: Several small hepatic hypodense lesions including a 0.9 by 0.8 cm hypodense lesion in the left hepatic lobe on image 12 series 2 appears stable. No biliary dilatation. Gallbladder unremarkable. Pancreas:  Unremarkable Spleen: Unremarkable Adrenals/Urinary Tract: Adrenal glands normal. 1-2 mm left kidney lower pole nonobstructive calculus, image 68 series 5. No other stones observed. No hydronephrosis or hydroureter. Non rotated right kidney. Mild urinary bladder wall thickening posteriorly and laterally. The floor of the urinary bladder is obscured by streak artifact from the patient' s bilateral hip implants. Stomach/Bowel: Sigmoid colon diverticulosis. Part of the sigmoid colon extends into the indirect left inguinal hernia which also contains adipose tissue. No findings of strangulation or obstruction. Scattered diverticula in the descending colon and rest of the colon. Appendix normal. No active diverticulitis identified. Vascular/Lymphatic: Aortoiliac atherosclerotic vascular disease. Small retroperitoneal lymph nodes are observed. Mildly enlarged right external iliac lymph nodes including a 1.1 cm right-sided node on image 66 series 2 (formerly 0.9 cm). Additional borderline enlarged pelvic lymph nodes are present including a 1.1 cm left common iliac lymph node on image 64 series 5. Reproductive: This probably a small punctate central calcification in the prostate gland on image 75 of series 2 and my sense is that the prostate is probably enlarged, although obscured by streak artifact. Other: No supplemental non-categorized findings. Musculoskeletal: Lumbar spondylosis and degenerative disc disease with notable foraminal impingement on the right at L5-S1 and on the left at L4-5 and L5-S1. Suspected central narrowing of the thecal sac at L2-3 and L4-5. IMPRESSION: 1. Wall thickening in the urinary bladder suspicious for cystitis. Mild pelvic lymph node enlargement, probably reactive. 2. 1-2 mm left kidney lower pole nonobstructive calculus. No hydronephrosis or hydroureter. 3. Portions the urinary bladder and prostate gland are obscured by streak artifact from the patient' s hip implants. 4. Sigmoid colon  diverticulosis, with part of the sigmoid colon extending into the indirect left inguinal hernia (no strangulation or obstruction). 5.  Aortoiliac atherosclerotic vascular disease. 6. Lumbar spondylosis and degenerative disc disease causing impingement at L4-5, L5-S 1, and L2-3. 7. 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 Electronically Signed  By: Van Clines M.D.   On: 10/19/2015 20:10    Review of Systems  Constitutional: Positive for fever, chills and malaise/fatigue.  HENT: Negative.   Eyes: Negative.   Respiratory: Negative.   Cardiovascular: Negative.   Gastrointestinal: Positive for nausea and vomiting.  Genitourinary: Positive for urgency and frequency.  Musculoskeletal: Negative.   Skin: Negative.   Neurological: Positive for loss of consciousness.  Endo/Heme/Allergies: Negative.   Psychiatric/Behavioral: Negative.    Blood pressure 110/54, pulse 112, temperature 99.3 F (37.4 C), temperature source Oral, resp. rate 31, weight 105.688 kg (233 lb), SpO2 92 %. Physical Exam  Constitutional: He is oriented to person, place, and time. He appears well-developed.  HENT:  Head: Normocephalic.  Eyes: Pupils are equal, round, and reactive to light.  Neck: Normal range of motion.  Cardiovascular:  Regular tachycardia by bedside monitor  Respiratory: Effort normal.  GI: Soft.  No Sp TTP  Genitourinary: Penis normal.  No CVAT  Musculoskeletal: Normal range of motion.  Neurological: He is alert and oriented to person, place, and time.  Skin: Skin is warm.  Psychiatric: He has a normal mood and affect. His behavior is normal. Judgment and thought content normal.    Assessment/Plan: 1 - Urosepsis - agree with current IV ABX, fluid recusitation pending further CX data. No additional CX data from our office to guide therapy. No surgically modifiable factors on imaging this admission.    2 - Prostatic Hypertrophy  - no evidence of urinary retention at  present and s/p recent outlet procedure. Low threshold for indwelling foley if develops retention, worsening infectrious parmaters, or needed for close UOP monitoring.   3 -  Nephrolithiasis - small, non-obstructing, likely not contributing to acute issues, observe.   4 - I will make Dr. Karsten Ro aware of pateints admission.   Kevin Suarez 10/19/2015, 10:16 PM

## 2015-10-19 NOTE — ED Notes (Signed)
Attempted to call report to Janett Billow, Therapist, sports and/or charge nurse on [place]. Name & number provided to secretary for floor nurse to return call.

## 2015-10-19 NOTE — ED Notes (Addendum)
Pt to ER via [facility] with complaint of nausea and vomiting. Pt has hx of kidney stones with obstruction and stent placement. Seen at [facility] by urology today, no stent needed. Pt received 4 of zofran in route, and 500 cc. Pt BP 98/44, HR 140. Pt a/o x4

## 2015-10-19 NOTE — ED Notes (Signed)
Per MD Regenia Skeeter, do not start IV antibiotics until pt has urinated. Pt aware of this plan. Also per MD, if pt cannot void in 5 minutes then I am to do an I/O cath to get urine sample.

## 2015-10-19 NOTE — ED Provider Notes (Signed)
CSN: [id number]     Arrival date & time 10/19/15  1800 History   First MD Initiated Contact with Patient 10/19/15 1816     Chief Complaint  Patient presents with  . Emesis     (Consider location/radiation/quality/duration/timing/severity/associated sxs/prior Treatment) HPI  74 year old male presents with vomiting and decreased urine output. Patient recently had a stone removed by his urologist. He had his Foley catheter removed yesterday. On follow-up today he thought he needed his Foley placed back in but the urologist stated he needed to drink more water and was clear for discharge. Patient developed lower abdominal pain in the midline last night and states that every time he has to urinate he feels pain in this area. Somewhat similar to his bladder stones in the past. Later today he started having a fever up to 104. Vomited once prior to EMS arrival. Patient has not urinated in over 4 hours. He denies any pain at this time, only if he has to use the bathroom. His back has been hurting since the original stone removal a few days ago.  Past Medical History  Diagnosis Date  . Hyperlipidemia   . BPH (benign prostatic hypertrophy)   . Bladder stone   . Type 2 diabetes mellitus ([place])   . Lung nodules     LAST CT 2012  PER DR WRIGHT NOTE NO FURTHER WORK-UP (PULMOLOGIST)  . History of kidney stones   . Hypertension   . Frequency of urination   . 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 Nephrolithiasis     bilateral  . Wears glasses   . Wears dentures     upper  . Wears partial dentures    Past Surgical History  Procedure Laterality Date  . Wrist surgery Left 2005    INJURY  . Total hip arthroplasty Bilateral 2000  &  2005  . Cataract extraction w/ intraocular lens  implant, bilateral    . Cystoscopy with litholapaxy N/A 11/28/2013    Procedure: CYSTOSCOPY WITH stone retrieval;  Surgeon: Claybon Jabs, MD;  Location: [facility];  Service: Urology;  Laterality: N/A;  . Transurethral  resection of prostate  11/28/2013    Procedure: TRANSURETHRAL RESECTION OF THE PROSTATE WITH  BUTTON GYRUS INSTRUMENTS;  Surgeon: Claybon Jabs, MD;  Location: [facility];  Service: Urology;;  . Extracorporeal shock wave lithotripsy Right 05-13-2015  . Cystoscopy with litholapaxy N/A 10/15/2015    Procedure: CYSTOSCOPY WITH LITHOLAPAXY, HOLMIUM LASER LITHOTRIPSY tranurethral excision of prostate;  Surgeon: [name], MD;  Location: [facility];  Service: Urology;  Laterality: N/A;  . Holmium laser application N/A [id number]    Procedure: HOLMIUM LASER APPLICATION;  Surgeon: [name], MD;  Location: [facility];  Service: Urology;  Laterality: N/A;   Family History  Problem Relation Age of Onset  . Cancer Mother   . Cancer Maternal Grandmother   . Heart attack Paternal Grandfather   . Coronary artery disease Father   . Diabetes Father    Social History  Substance Use Topics  . Smoking status: Former Smoker -- 2.00 packs/day for 30 years    Types: Cigarettes    Quit date: 11/14/1995  . Smokeless tobacco: Never Used  . Alcohol Use: No    Review of Systems  Constitutional: Positive for fever.  Respiratory: Negative for cough and shortness of breath.   Gastrointestinal: Positive for vomiting and abdominal pain.  Genitourinary: Positive for dysuria and difficulty urinating.  Musculoskeletal: Positive  for back pain.  All other systems reviewed and are negative.     Allergies  Prednisone  Home Medications   Prior to Admission medications   Medication Sig Start Date End Date Taking? Authorizing Provider  aspirin EC 81 MG tablet Take 81 mg by mouth daily.   Yes Historical Provider, MD  HYDROcodone-acetaminophen (NORCO) 10-325 MG tablet Take 1-2 tablets by mouth every 4 (four) hours as needed for moderate pain. Maximum dose per 24 hours - 8 pills 10/15/15  Yes [name], MD  losartan (COZAAR) 50 MG tablet Take 50 mg by mouth every  morning.   Yes Historical Provider, MD  metFORMIN (GLUCOPHAGE) 500 MG tablet Take 500 mg by mouth 2 (two) times daily with a meal.    Yes Historical Provider, MD  Meth-Hyo-M Bl-Na Phos-Ph Sal (URIBEL) 118 MG CAPS Take 118 mg by mouth daily.   Yes Historical Provider, MD  phenazopyridine (PYRIDIUM) 200 MG tablet Take 1 tablet (200 mg total) by mouth 3 (three) times daily as needed for pain. 10/15/15  Yes [name], MD  tamsulosin (FLOMAX) 0.4 MG CAPS capsule Take 0.4 mg by mouth at bedtime. 09/14/15  Yes Historical Provider, MD   BP 98/53 mmHg  Pulse 115  Temp(Src) 101.5 F (38.6 C) (Oral)  Resp 23  SpO2 93% Physical Exam  Constitutional: He is oriented to person, place, and time. He appears well-developed and well-nourished.  HENT:  Head: Normocephalic and atraumatic.  Right Ear: External ear normal.  Left Ear: External ear normal.  Nose: Nose normal.  Eyes: Right eye exhibits no discharge. Left eye exhibits no discharge.  Neck: Neck supple.  Cardiovascular: Regular rhythm, normal heart sounds and intact distal pulses.  Tachycardia present.   Pulmonary/Chest: Effort normal and breath sounds normal.  Abdominal: Soft. He exhibits no distension. There is no tenderness.  No CVA tenderness  Musculoskeletal: He exhibits no edema.  Neurological: He is alert and oriented to person, place, and time.  Skin: Skin is warm and dry.  Nursing note and vitals reviewed.   ED Course  Procedures (including critical care time) Labs Review Labs Reviewed  COMPREHENSIVE METABOLIC PANEL - Abnormal; Notable for the following:    Potassium 3.4 (*)    CO2 21 (*)    Glucose, Bld 160 (*)    Creatinine, Ser 1.37 (*)    Total Protein 6.2 (*)    Albumin 3.4 (*)    GFR calc non Af Amer 49 (*)    GFR calc Af Amer 57 (*)    All other components within normal limits  CBC WITH DIFFERENTIAL/PLATELET - Abnormal; Notable for the following:    WBC 13.5 (*)    RBC 4.16 (*)    Hemoglobin 11.8 (*)    HCT 35.3  (*)    Platelets 142 (*)    Neutro Abs 12.8 (*)    Lymphs Abs 0.5 (*)    All other components within normal limits  URINALYSIS, ROUTINE W REFLEX MICROSCOPIC (NOT AT [facility]) - Abnormal; Notable for the following:    Color, Urine ORANGE (*)    APPearance TURBID (*)    Hgb urine dipstick LARGE (*)    Bilirubin Urine SMALL (*)    Protein, ur 100 (*)    Leukocytes, UA LARGE (*)    All other components within normal limits  URINE MICROSCOPIC-ADD ON - Abnormal; Notable for the following:    Squamous Epithelial / LPF 0-5 (*)    Bacteria, UA MANY (*)  All other components within normal limits  GLUCOSE, CAPILLARY - Abnormal; Notable for the following:    Glucose-Capillary 148 (*)    All other components within normal limits  I-STAT CG4 LACTIC ACID, ED - Abnormal; Notable for the following:    Lactic Acid, Venous 2.94 (*)    All other components within normal limits  CULTURE, BLOOD (ROUTINE X 2)  CULTURE, BLOOD (ROUTINE X 2)  URINE CULTURE  MRSA PCR SCREENING  CREATININE, URINE, RANDOM  SODIUM, URINE, RANDOM  LACTIC ACID, PLASMA  LACTIC ACID, PLASMA  CBC  BASIC METABOLIC PANEL    Imaging Review Dg Chest Port 1 View  10/19/2015  CLINICAL DATA:  Acute onset of nausea and vomiting. Initial encounter. EXAM: PORTABLE CHEST 1 VIEW COMPARISON:  Chest radiograph performed 09/20/2011, and CT of the chest performed 10/25/2011 FINDINGS: The lungs are well-aerated. Mild pleural calcification is again noted at the right lung base. There is no evidence of focal opacification, pleural effusion or pneumothorax. The cardiomediastinal silhouette is within normal limits. No acute osseous abnormalities are seen. IMPRESSION: 1. No acute cardiopulmonary process seen. 2. Mild pleural calcification again noted at the right lung base, raising question for prior asbestos exposure. Electronically Signed   By: Garald Balding M.D.   On: 10/19/2015 19:10   Ct Renal Stone Study  10/19/2015  CLINICAL DATA:  Back pain.  Kidney stone removed 4 days ago with catheter placement, and subsequent catheter removal. Urinary tract infection. EXAM: CT ABDOMEN AND PELVIS WITHOUT CONTRAST TECHNIQUE: Multidetector CT imaging of the abdomen and pelvis was performed following the standard protocol without IV contrast. COMPARISON:  04/28/2015 FINDINGS: Lower chest: Calcified pleural plaques along both hemidiaphragms and along the posterior pleural surfaces compatible with prior asbestos exposure. No fibrosis in the lung bases. Hepatobiliary: Several small hepatic hypodense lesions including a 0.9 by 0.8 cm hypodense lesion in the left hepatic lobe on image 12 series 2 appears stable. No biliary dilatation. Gallbladder unremarkable. Pancreas: Unremarkable Spleen: Unremarkable Adrenals/Urinary Tract: Adrenal glands normal. 1-2 mm left kidney lower pole nonobstructive calculus, image 68 series 5. No other stones observed. No hydronephrosis or hydroureter. Non rotated right kidney. Mild urinary bladder wall thickening posteriorly and laterally. The floor of the urinary bladder is obscured by streak artifact from the patient' s bilateral hip implants. Stomach/Bowel: Sigmoid colon diverticulosis. Part of the sigmoid colon extends into the indirect left inguinal hernia which also contains adipose tissue. No findings of strangulation or obstruction. Scattered diverticula in the descending colon and rest of the colon. Appendix normal. No active diverticulitis identified. Vascular/Lymphatic: Aortoiliac atherosclerotic vascular disease. Small retroperitoneal lymph nodes are observed. Mildly enlarged right external iliac lymph nodes including a 1.1 cm right-sided node on image 66 series 2 (formerly 0.9 cm). Additional borderline enlarged pelvic lymph nodes are present including a 1.1 cm left common iliac lymph node on image 64 series 5. Reproductive: This probably a small punctate central calcification in the prostate gland on image 75 of series 2 and my  sense is that the prostate is probably enlarged, although obscured by streak artifact. Other: No supplemental non-categorized findings. Musculoskeletal: Lumbar spondylosis and degenerative disc disease with notable foraminal impingement on the right at L5-S1 and on the left at L4-5 and L5-S1. Suspected central narrowing of the thecal sac at L2-3 and L4-5. IMPRESSION: 1. Wall thickening in the urinary bladder suspicious for cystitis. Mild pelvic lymph node enlargement, probably reactive. 2. 1-2 mm left kidney lower pole nonobstructive calculus. No hydronephrosis or hydroureter. 3. Portions  the urinary bladder and prostate gland are obscured by streak artifact from the patient' s hip implants. 4. Sigmoid colon diverticulosis, with part of the sigmoid colon extending into the indirect left inguinal hernia (no strangulation or obstruction). 5.  Aortoiliac atherosclerotic vascular disease. 6. Lumbar spondylosis and degenerative disc disease causing impingement at L4-5, L5-S 1, and L2-3. 7. 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 Electronically Signed   By: Van Clines M.D.   On: 10/19/2015 20:10   I have personally reviewed and evaluated these images and lab results as part of my medical decision-making.   EKG Interpretation None      MDM   Final diagnoses:  None  Sepsis UTI  Patient with signs and symptoms of sepsis that appear to be attributed to urinary tract infection. This is likely related to his recent Foley as well as instrumentation to remove his stone. CT does not show a stone at this time. He does have evidence of acute kidney injury but his blood pressure has improved with IV fluids. He was also given Tylenol for fever. Started on Rocephin as urine appears to be the source. Bladder scan shows only 143 mL in his bladder, not consistent with retention. Foley withheld at this time. Admitted to the stepdown unit with the hospitalist.    Sherwood Gambler, MD 10/20/15  0021

## 2015-10-19 NOTE — ED Notes (Signed)
Patient transported to CT via stretcher.

## 2015-10-20 LAB — BASIC METABOLIC PANEL
ANION GAP: 8 (ref 5–15)
BUN: 18 mg/dL (ref 6–20)
CO2: 25 mmol/L (ref 22–32)
Calcium: 8.2 mg/dL — ABNORMAL LOW (ref 8.9–10.3)
Chloride: 106 mmol/L (ref 101–111)
Creatinine, Ser: 1.27 mg/dL — ABNORMAL HIGH (ref 0.61–1.24)
GFR calc Af Amer: >60 mL/min (ref >60)
GFR calc non Af Amer: 54 mL/min — ABNORMAL LOW (ref >60)
GLUCOSE: 142 mg/dL — AB (ref 65–99)
POTASSIUM: 4.5 mmol/L (ref 3.5–5.1)
Sodium: 139 mmol/L (ref 135–145)

## 2015-10-20 LAB — GLUCOSE, CAPILLARY
GLUCOSE-CAPILLARY: 131 mg/dL — AB (ref 65–99)
GLUCOSE-CAPILLARY: 133 mg/dL — AB (ref 65–99)
Glucose-Capillary: 133 mg/dL — ABNORMAL HIGH (ref 65–99)
Glucose-Capillary: 171 mg/dL — ABNORMAL HIGH (ref 65–99)

## 2015-10-20 LAB — CBC
HEMATOCRIT: 33 % — AB (ref 39.0–52.0)
Hemoglobin: 10.7 g/dL — ABNORMAL LOW (ref 13.0–17.0)
MCH: 27.8 pg (ref 26.0–34.0)
MCHC: 32.4 g/dL (ref 30.0–36.0)
MCV: 85.7 fL (ref 78.0–100.0)
Platelets: 142 10*3/uL — ABNORMAL LOW (ref 150–400)
RBC: 3.85 MIL/uL — AB (ref 4.22–5.81)
RDW: 13.6 % (ref 11.5–15.5)
WBC: 19.2 10*3/uL — AB (ref 4.0–10.5)

## 2015-10-20 LAB — LACTIC ACID, PLASMA
Lactic Acid, Venous: 1.4 mmol/L (ref 0.5–2.0)
Lactic Acid, Venous: 1.6 mmol/L (ref 0.5–2.0)

## 2015-10-20 LAB — MRSA PCR SCREENING: MRSA by PCR: NEGATIVE

## 2015-10-20 MED ORDER — OXYCODONE HCL 5 MG PO TABS
5.0000 mg | ORAL_TABLET | ORAL | Status: DC | PRN
  Administered 2015-10-22: 10 mg via ORAL
  Filled 2015-10-20: qty 1
  Filled 2015-10-20: qty 2

## 2015-10-20 MED ORDER — TRAMADOL HCL 50 MG PO TABS: 50.0000 mg | ORAL_TABLET | Freq: Four times a day (QID) | ORAL | Status: DC | PRN

## 2015-10-20 NOTE — Care Management Note (Addendum)
Case Management Note  Patient Details  Name: Kevin Suarez MRN: [id number] Date of Birth: 08/17/1941  Subjective/Objective:                 Admitted with sepsis/fever, past medical history significant for NIDDM and HTN and recurrent nephrolithiasis who presents with fever and dysuria after stone removal.    Action/Plan: Return to home when medically stable. CM to f/u with disposition needs.  Expected Discharge Date:                  Expected Discharge Plan:  Home/Self Care  In-House Referral:     Discharge planning Services  CM Consult  Post Acute Care Choice:    Choice offered to:     DME Arranged:    DME Agency:     HH Arranged:    HH Agency:     Status of Service:  In process, will continue to follow  Medicare Important Message Given:    Date Medicare IM Given:    Medicare IM give by:    Date Additional Medicare IM Given:    Additional Medicare Important Message give by:     If discussed at [place] of Stay Meetings, dates discussed:    Additional CommentsHenri Nodarse (Spouse) [phone] Stefanie Libel (Daughter)  [phone]  [address] [place], [place] [phone] 10/20/2015, 4:16 PM

## 2015-10-20 NOTE — Progress Notes (Signed)
UR COMPLETED  

## 2015-10-20 NOTE — Progress Notes (Signed)
Pt transferred to 3S08 from Emergency Dept.  Wife went home prior to admission, ED nurse states she said she will be back in the morning.  Pt oriented to room and educated on call bell which is in reach.

## 2015-10-20 NOTE — Progress Notes (Signed)
CRITICAL VALUE ALERT  Critical value received:  Positive Blood Culture - Aerobic gram negative  Date of notification: 10/20/15  Time of notification:  2010  Critical value read back:Yes.    Nurse who received alert:  Sylvan Cheese, RN   MD notified (1st page):  Lamar Blinks, NP   Time of first page:  2011   MD notified (2nd page): Lamar Blinks, NP  Time of second page: 2042  Responding MD:  Lamar Blinks, NP  Time MD responded:  2045

## 2015-10-20 NOTE — Plan of Care (Signed)
Problem: Physical Regulation: Goal: Will remain free from infection Outcome: Progressing Antibiotics being given for gram negative aerobic bacteria

## 2015-10-20 NOTE — Progress Notes (Signed)
[facility] TEAM 1 - Stepdown/ICU TEAM PROGRESS NOTE  PARAS TELLMAN [id number] DOB: [birth year] DOA: 10/19/2015 PCP: Haywood Pao, MD  Admit HPI / Brief Narrative: 74 y.o. male with a history of DM, HTN, and recurrent nephrolithiasis who presented with fever and dysuria after stone removal.  The patient had a cystolitholapaxy and transurethral prostate incision last Friday, was in hospital overnight with foley, which was removed before discharge. Since discharge, the patient notes suprapubic pain, dysuria and stinging with urination, urinary urgency, and difficulty emptying. When he developed fever he came to the ER.  In the ED the patient was febrile, tachycardic, and had soft blood pressure - urine was +, elevated lactic acid, and AKI. Chest x-ray was clear. CT renal protocol showed no hydronephrosis or drainable fluid collection.   HPI/Subjective: The pt is resting comfortably, with exception to a mild HA.  He denies cp, sob, n/v, or abdom pain.    Assessment/Plan:  Sepsis due to UTI / complicated pyelo  Suspected source urine - cont empiric abx tx - BP stable and HR improved w/ volume resuscitation - lactate has normalized   AKI  Due to above - crt improving w/ volume expansion   Hypokalemia  Resolved - recheck in AM   Anemia Chronic - Hgb drifting down w/ hydration - no evidence of acute blood loss   Thrombocytopenia Presumed from sepsis - stable   Prostatic Hypertrophy Per Urology "s/p transurthral incision of prostate with cystolithalopexy 10/15/2015 for recurrent bladder stones and outlet obstruction. Passed office trial of void 10/18/2015. He had similar procedure a year" ago.   DM CBG stable - follow   Hx of HTN  Holding usual meds for now - follow   Obese - Body mass index is 34.76 kg/(m^2).  Code Status: FULL Family Communication: spoke w/ wife at bedside  Disposition Plan: SDU   Consultants: Urology   Procedures: none  Antibiotics: Tressie Ellis  12/6 >  DVT prophylaxis: lovenox   Objective: Blood pressure 119/56, pulse 87, temperature 97.2 F (36.2 C), temperature source Oral, resp. rate 25, height 5\' 10"  (1.778 m), weight 109.9 kg (242 lb 4.6 oz), SpO2 95 %.  Intake/Output Summary (Last 24 hours) at 10/20/15 1029 Last data filed at 10/20/15 0930  Gross per 24 hour  Intake 4157.5 ml  Output    765 ml  Net 3392.5 ml   Exam: General: No acute respiratory distress Lungs: Clear to auscultation bilaterally without wheezes or crackles Cardiovascular: Regular rate and rhythm without murmur gallop or rub normal S1 and S2 Abdomen: Nontender, protuberent, soft, bowel sounds positive, no rebound, no ascites, no appreciable mass Extremities: No significant cyanosis, clubbing, or edema bilateral lower extremities  Data Reviewed: Basic Metabolic Panel:  Recent Labs Lab 10/15/15 0750 10/19/15 1850 10/20/15 0454  NA 141 137 139  K 4.2 3.4* 4.5  CL  --  105 106  CO2  --  21* 25  GLUCOSE 111* 160* 142*  BUN  --  20 18  CREATININE  --  1.37* 1.27*  CALCIUM  --  9.2 8.2*    CBC:  Recent Labs Lab 10/15/15 0750 10/19/15 1850 10/20/15 0454  WBC  --  13.5* 19.2*  NEUTROABS  --  12.8*  --   HGB 12.9* 11.8* 10.7*  HCT 38.0* 35.3* 33.0*  MCV  --  84.9 85.7  PLT  --  142* 142*    Liver Function Tests:  Recent Labs Lab 10/19/15 1850  AST 23  ALT 17  ALKPHOS 93  BILITOT 1.0  PROT 6.2*  ALBUMIN 3.4*   Coags: No results for input(s): INR in the last 168 hours.  Invalid input(s): PT No results for input(s): APTT in the last 168 hours.  CBG:  Recent Labs Lab 10/15/15 0955 10/19/15 2301 10/20/15 0713  GLUCAP 102* 148* 133*    Recent Results (from the past 240 hour(s))  MRSA PCR Screening     Status: None   Collection Time: 10/19/15 11:02 PM  Result Value Ref Range Status   MRSA by PCR NEGATIVE NEGATIVE Final    Comment:        The GeneXpert MRSA Assay (FDA approved for NASAL specimens only), is one  component of a comprehensive MRSA colonization surveillance program. It is not intended to diagnose MRSA infection nor to guide or monitor treatment for MRSA infections.      Studies:   Recent x-ray studies have been reviewed in detail by the Attending Physician  Scheduled Meds:  Scheduled Meds: . aspirin EC  81 mg Oral Daily  . cefTAZidime (FORTAZ)  IV  2 g Intravenous 3 times per day  . enoxaparin (LOVENOX) injection  40 mg Subcutaneous Q24H  . insulin aspart  0-5 Units Subcutaneous QHS  . insulin aspart  0-9 Units Subcutaneous TID WC  . sodium chloride  3 mL Intravenous Q12H  . tamsulosin  0.4 mg Oral QHS    Time spent on care of this patient: 35 mins   Inman Fettig T , MD   [facility] Office  [phone] Pager - Text Page per Shea Evans as per below:  On-Call/Text Page:      [email or web address]      password [email or web address]  If 7PM-7AM, please contact night-coverage [email or web address] Password [email or web address] 10/20/2015, 10:29 AM   LOS: 1 day

## 2015-10-21 DIAGNOSIS — N179 Acute kidney failure, unspecified: Secondary | ICD-10-CM

## 2015-10-21 DIAGNOSIS — D649 Anemia, unspecified: Secondary | ICD-10-CM

## 2015-10-21 DIAGNOSIS — D696 Thrombocytopenia, unspecified: Secondary | ICD-10-CM

## 2015-10-21 DIAGNOSIS — R7881 Bacteremia: Secondary | ICD-10-CM

## 2015-10-21 DIAGNOSIS — E876 Hypokalemia: Secondary | ICD-10-CM

## 2015-10-21 DIAGNOSIS — N390 Urinary tract infection, site not specified: Secondary | ICD-10-CM

## 2015-10-21 DIAGNOSIS — N400 Enlarged prostate without lower urinary tract symptoms: Secondary | ICD-10-CM | POA: Diagnosis present

## 2015-10-21 DIAGNOSIS — A419 Sepsis, unspecified organism: Secondary | ICD-10-CM

## 2015-10-21 LAB — COMPREHENSIVE METABOLIC PANEL
ALBUMIN: 2.7 g/dL — AB (ref 3.5–5.0)
ALT: 14 U/L — ABNORMAL LOW (ref 17–63)
ANION GAP: 7 (ref 5–15)
AST: 19 U/L (ref 15–41)
Alkaline Phosphatase: 75 U/L (ref 38–126)
BUN: 13 mg/dL (ref 6–20)
CHLORIDE: 108 mmol/L (ref 101–111)
CO2: 23 mmol/L (ref 22–32)
Calcium: 8.4 mg/dL — ABNORMAL LOW (ref 8.9–10.3)
Creatinine, Ser: 0.94 mg/dL (ref 0.61–1.24)
GFR calc Af Amer: >60 mL/min (ref >60)
GFR calc non Af Amer: >60 mL/min (ref >60)
GLUCOSE: 126 mg/dL — AB (ref 65–99)
POTASSIUM: 4.5 mmol/L (ref 3.5–5.1)
Sodium: 138 mmol/L (ref 135–145)
Total Bilirubin: 0.4 mg/dL (ref 0.3–1.2)
Total Protein: 5.5 g/dL — ABNORMAL LOW (ref 6.5–8.1)

## 2015-10-21 LAB — GLUCOSE, CAPILLARY
GLUCOSE-CAPILLARY: 107 mg/dL — AB (ref 65–99)
Glucose-Capillary: 118 mg/dL — ABNORMAL HIGH (ref 65–99)
Glucose-Capillary: 127 mg/dL — ABNORMAL HIGH (ref 65–99)
Glucose-Capillary: 126 mg/dL — ABNORMAL HIGH (ref 65–99)

## 2015-10-21 LAB — VITAMIN B12

## 2015-10-21 LAB — CBC
HEMATOCRIT: 32.6 % — AB (ref 39.0–52.0)
HEMOGLOBIN: 10.7 g/dL — AB (ref 13.0–17.0)
MCH: 27.9 pg (ref 26.0–34.0)
MCHC: 32.8 g/dL (ref 30.0–36.0)
MCV: 84.9 fL (ref 78.0–100.0)
Platelets: 108 10*3/uL — ABNORMAL LOW (ref 150–400)
RBC: 3.84 MIL/uL — ABNORMAL LOW (ref 4.22–5.81)
RDW: 13.5 % (ref 11.5–15.5)
WBC: 10.2 10*3/uL (ref 4.0–10.5)

## 2015-10-21 LAB — RETICULOCYTES
RBC.: 3.91 MIL/uL — AB (ref 4.22–5.81)
RETIC COUNT ABSOLUTE: 27.4 10*3/uL (ref 19.0–186.0)
RETIC CT PCT: 0.7 % (ref 0.4–3.1)

## 2015-10-21 LAB — IRON AND TIBC
Iron: 20 ug/dL — ABNORMAL LOW (ref 45–182)
Saturation Ratios: 9 % — ABNORMAL LOW (ref 17.9–39.5)
TIBC: 223 ug/dL — AB (ref 250–450)
UIBC: 203 ug/dL

## 2015-10-21 LAB — FOLATE: Folate: 15.5 ng/mL (ref >5.9)

## 2015-10-21 LAB — FERRITIN: FERRITIN: 192 ng/mL (ref 24–336)

## 2015-10-21 MED ORDER — ZOLPIDEM TARTRATE 5 MG PO TABS
5.0000 mg | ORAL_TABLET | Freq: Every evening | ORAL | Status: DC | PRN
  Administered 2015-10-22 (×2): 5 mg via ORAL
  Filled 2015-10-21 (×2): qty 1

## 2015-10-21 MED ORDER — SUMATRIPTAN SUCCINATE 50 MG PO TABS
50.0000 mg | ORAL_TABLET | ORAL | Status: DC | PRN
  Filled 2015-10-21: qty 1

## 2015-10-21 MED ORDER — SUMATRIPTAN SUCCINATE 6 MG/0.5ML ~~LOC~~ SOLN
6.0000 mg | Freq: Once | SUBCUTANEOUS | Status: AC
  Administered 2015-10-21: 6 mg via SUBCUTANEOUS
  Filled 2015-10-21: qty 0.5

## 2015-10-21 NOTE — Progress Notes (Signed)
Subjective: Patient reports feeling better.  He was having some dysuria and that seems to have improved over the past 24 hours.  Objective: Vital signs in last 24 hours: Temp:  [98 F (36.7 C)-99.3 F (37.4 C)] 98.5 F (36.9 C) (12/08 0746) Pulse Rate:  [89-106] 89 (12/08 0746) Resp:  [26-33] 26 (12/08 0746) BP: (120-142)/(65-72) 120/66 mmHg (12/08 0746) SpO2:  [96 %-98 %] 96 % (12/08 0746)  Intake/Output from previous day: 12/07 0701 - 12/08 0700 In: 1750 [I.V.:1600; IV Piggyback:150] Out: 3475 [Urine:3475] Intake/Output this shift: Total I/O In: -  Out: 300 [Urine:300]  Physical Exam:  General:alert, cooperative and no distress Abdomen is soft and nontender No CVAT  Lab Results:  Recent Labs  10/19/15 1850 10/20/15 0454 10/21/15 0510  HGB 11.8* 10.7* 10.7*  HCT 35.3* 33.0* 32.6*   BMET  Recent Labs  10/20/15 0454 10/21/15 0510  NA 139 138  K 4.5 4.5  CL 106 108  CO2 25 23  GLUCOSE 142* 126*  BUN 18 13  CREATININE 1.27* 0.94  CALCIUM 8.2* 8.4*   No results for input(s): LABPT, INR in the last 72 hours. No results for input(s): LABURIN in the last 72 hours. Results for orders placed or performed during the hospital encounter of 10/19/15  Blood Culture (routine x 2)     Status: None (Preliminary result)   Collection Time: 10/19/15  6:33 PM  Result Value Ref Range Status   Specimen Description BLOOD RIGHT ANTECUBITAL  Final   Special Requests BOTTLES DRAWN AEROBIC AND ANAEROBIC 4CC  Final   Culture NO GROWTH < 24 HOURS  Final   Report Status PENDING  Incomplete  Blood Culture (routine x 2)     Status: None (Preliminary result)   Collection Time: 10/19/15  6:38 PM  Result Value Ref Range Status   Specimen Description BLOOD RIGHT HAND  Final   Special Requests BOTTLES DRAWN AEROBIC AND ANAEROBIC 5CC  Final   Culture  Setup Time   Final    GRAM NEGATIVE RODS AEROBIC BOTTLE ONLY CRITICAL RESULT CALLED TO, READ BACK BY AND VERIFIED WITH: Jani Gravel RN  2008 10/20/15 A BROWNING    Culture NO GROWTH < 24 HOURS  Final   Report Status PENDING  Incomplete  Urine culture     Status: None (Preliminary result)   Collection Time: 10/19/15  6:59 PM  Result Value Ref Range Status   Specimen Description URINE, RANDOM  Final   Special Requests ADDED 2212  Final   Culture TOO YOUNG TO READ  Final   Report Status PENDING  Incomplete  MRSA PCR Screening     Status: None   Collection Time: 10/19/15 11:02 PM  Result Value Ref Range Status   MRSA by PCR NEGATIVE NEGATIVE Final    Comment:        The GeneXpert MRSA Assay (FDA approved for NASAL specimens only), is one component of a comprehensive MRSA colonization surveillance program. It is not intended to diagnose MRSA infection nor to guide or monitor treatment for MRSA infections.     Studies/Results: Dg Chest Port 1 View  10/19/2015  CLINICAL DATA:  Acute onset of nausea and vomiting. Initial encounter. EXAM: PORTABLE CHEST 1 VIEW COMPARISON:  Chest radiograph performed 09/20/2011, and CT of the chest performed 10/25/2011 FINDINGS: The lungs are well-aerated. Mild pleural calcification is again noted at the right lung base. There is no evidence of focal opacification, pleural effusion or pneumothorax. The cardiomediastinal silhouette is within normal limits.  No acute osseous abnormalities are seen. IMPRESSION: 1. No acute cardiopulmonary process seen. 2. Mild pleural calcification again noted at the right lung base, raising question for prior asbestos exposure. Electronically Signed   By: Garald Balding M.D.   On: 10/19/2015 19:10   Ct Renal Stone Study  10/19/2015  CLINICAL DATA:  Back pain. Kidney stone removed 4 days ago with catheter placement, and subsequent catheter removal. Urinary tract infection. EXAM: CT ABDOMEN AND PELVIS WITHOUT CONTRAST TECHNIQUE: Multidetector CT imaging of the abdomen and pelvis was performed following the standard protocol without IV contrast. 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 No fibrosis in the lung bases. Hepatobiliary: Several small hepatic hypodense lesions including a 0.9 by 0.8 cm hypodense lesion in the left hepatic lobe on image 12 series 2 appears stable. No biliary dilatation. Gallbladder unremarkable. Pancreas: Unremarkable Spleen: Unremarkable Adrenals/Urinary Tract: Adrenal glands normal. 1-2 mm left kidney lower pole nonobstructive calculus, image 68 series 5. No other stones observed. No hydronephrosis or hydroureter. Non rotated right kidney. Mild urinary bladder wall thickening posteriorly and laterally. The floor of the urinary bladder is obscured by streak artifact from the patient' s bilateral hip implants. Stomach/Bowel: Sigmoid colon diverticulosis. Part of the sigmoid colon extends into the indirect left inguinal hernia which also contains adipose tissue. No findings of strangulation or obstruction. Scattered diverticula in the descending colon and rest of the colon. Appendix normal. No active diverticulitis identified. Vascular/Lymphatic: Aortoiliac atherosclerotic vascular disease. Small retroperitoneal lymph nodes are observed. Mildly enlarged right external iliac lymph nodes including a 1.1 cm right-sided node on image 66 series 2 (formerly 0.9 cm). Additional borderline enlarged pelvic lymph nodes are present including a 1.1 cm left common iliac lymph node on image 64 series 5. Reproductive: This probably a small punctate central calcification in the prostate gland on image 75 of series 2 and my sense is that the prostate is probably enlarged, although obscured by streak artifact. Other: No supplemental non-categorized findings. Musculoskeletal: Lumbar spondylosis and degenerative disc disease with notable foraminal impingement on the right at L5-S1 and on the left at L4-5 and L5-S1. Suspected  central narrowing of the thecal sac at L2-3 and L4-5. IMPRESSION: 1. Wall thickening in the urinary bladder suspicious for cystitis. Mild pelvic lymph node enlargement, probably reactive. 2. 1-2 mm left kidney lower pole nonobstructive calculus. No hydronephrosis or hydroureter. 3. Portions the urinary bladder and prostate gland are obscured by streak artifact from the patient' s hip implants. 4. Sigmoid colon diverticulosis, with part of the sigmoid colon extending into the indirect left inguinal hernia (no strangulation or obstruction). 5.  Aortoiliac atherosclerotic vascular disease. 6. Lumbar spondylosis and degenerative disc disease causing impingement at L4-5, L5-S 1, and L2-3. 7. 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 Electronically Signed   By: Van Clines M.D.   On: 10/19/2015 20:10    Assessment/Plan: Probable urosepsis: Despite having clear urine preoperatively, a negative preoperative urine culture and preoperative antibiotics for his cystolitholapaxy and TUIP he appears to have had significant bacteria in his urine on his admission 4 days later.  He is growing gram-negative rods in his blood. Blood and urine cultures remain pending at this time but he clinically is feeling better.  He was having dysuria and that seems to have improved as well.  Continue antibiotics while awaiting final urine and blood cultures  He has a follow-up appointment with me  already scheduled on 10/28/15.   LOS: 2 days   Bren Borys C 10/21/2015, 8:46 AM

## 2015-10-21 NOTE — Progress Notes (Signed)
[facility] TEAM 1 - Stepdown/ICU TEAM Progress Note  Kevin Suarez [id number] DOB: [birth year] DOA: 10/19/2015 PCP: Haywood Pao, MD  Admit HPI / Brief Narrative: 74 y.o. male PMHx Nephrolithiasis 5, NIDDM and HTN   Presents with fever and dysuria after stone removal.  The patient had a cystolitholapaxy and transurethral prostate incision last Friday, was in hospital overnight with foley, which was removed before discharge. Since discharge, the patient notes suprapubic pain severe, dysuria and stinging with urination, urinary urgency, and difficulty emptying. This evening he developed fever and so he came to the ER.  In the ED, the patient was febrile, tachycardic, and had soft blood pressure urine he also had leukocytosis, elevated lactic acid, and AKI. Chest x-ray was clear. CT renal protocol showed no hydronephrosis or drainable fluid collection. TR[facility] were asked to admit for sepsis with urinary source.    HPI/Subjective: 12/8A/O 4, patient states some initial pain with urination into the urinal but much better than having catheter. Positive refractory headache centered about both eyes/behind left eye did not respond to Tylenol. Negative neck pain/stiffness.   Assessment/Plan: Sepsis due to UTI / complicated pyelo positive GNR -Suspected source urine  - cont empiric abx tx   AKI  -Resolved  -Hold nephrotoxic agents.  Prostatic Hypertrophy -Per Urology "s/p transurthral incision of prostate with cystolithalopexy 10/15/2015 for recurrent bladder stones and outlet obstruction. Passed office trial of void 10/18/2015. He had similar procedure a year" ago.   Bacteremia positive GNR -Continue current antibiotic. -Patient to remain until speciation/sensitivities result in order to change over to proper PO antibiotic  Hypokalemia  Resolved    Anemia unspecified -Chronic; obtain anemia panel -No evidence of acute bleed    Thrombocytopenia -Slight trend down today (lab  error?)  -Monitor closely   DMType 2 -CBG stable  - Hemoglobin A1c pending  Hx of HTN  -Stable  -Holding usual meds for now - follow   Headache refractory to treatment -Sumatriptan 6 mg injection 1  Obese - Body mass index is 34.76 kg/(m^2).   Code Status: FULL Family Communication: no family present at time of exam Disposition Plan: Home    Consultants: Dr. Alexis Frock [facility]    Procedure/Significant Events:    Culture 12/6 blood right AC NGTD 12/6 blood right hand positive GNR 12/6 urine positive GNR 12/6 MRSA PCR negative   Antibiotics: Tressie Ellis 12/6>>  DVT prophylaxis: Lovenox   Devices    LINES / TUBES:      Continuous Infusions: . sodium chloride 100 mL/hr at 10/21/15 1324    Objective: VITAL SIGNS: Temp: 98.5 F (36.9 C) (12/08 1201) Temp Source: Oral (12/08 1201) BP: 147/81 mmHg (12/08 1201) Pulse Rate: 77 (12/08 1201) SPO2; FIO2:   Intake/Output Summary (Last 24 hours) at 10/21/15 1413 Last data filed at 10/21/15 1324  Gross per 24 hour  Intake   1600 ml  Output   3425 ml  Net  -1825 ml     Exam: General: A/O/4, pain with urination, No acute respiratory distress Eyes: Positive refractory headache, positive pain behind left eye, negative double vision,negative scleral hemorrhage ENT: Negative Runny nose, negative ear pain, positive chronic tinnitus, negative gingival bleeding, Neck:  Negative scars, masses, torticollis, lymphadenopathy, JVD Lungs: Clear to auscultation bilaterally without wheezes or crackles Cardiovascular: Regular rate and rhythm without murmur gallop or rub normal S1 and S2 Abdomen:negative abdominal pain, nondistended, positive soft, bowel sounds, no rebound, no ascites, no appreciable mass, positive right CVA tenderness Extremities: No significant cyanosis,  clubbing, or edema bilateral lower extremities Psychiatric:  Negative depression, negative anxiety, negative fatigue, negative mania    Neurologic:  Cranial nerves II through XII intact, tongue/uvula midline, all extremities muscle strength 5/5, sensation intact throughout,negative dysarthria, negative expressive aphasia, negative receptive aphasia.   Data Reviewed: Basic Metabolic Panel:  Recent Labs Lab 10/15/15 0750 10/19/15 1850 10/20/15 0454 10/21/15 0510  NA 141 137 139 138  K 4.2 3.4* 4.5 4.5  CL  --  105 106 108  CO2  --  21* 25 23  GLUCOSE 111* 160* 142* 126*  BUN  --  20 18 13   CREATININE  --  1.37* 1.27* 0.94  CALCIUM  --  9.2 8.2* 8.4*   Liver Function Tests:  Recent Labs Lab 10/19/15 1850 10/21/15 0510  AST 23 19  ALT 17 14*  ALKPHOS 93 75  BILITOT 1.0 0.4  PROT 6.2* 5.5*  ALBUMIN 3.4* 2.7*   No results for input(s): LIPASE, AMYLASE in the last 168 hours. No results for input(s): AMMONIA in the last 168 hours. CBC:  Recent Labs Lab 10/15/15 0750 10/19/15 1850 10/20/15 0454 10/21/15 0510  WBC  --  13.5* 19.2* 10.2  NEUTROABS  --  12.8*  --   --   HGB 12.9* 11.8* 10.7* 10.7*  HCT 38.0* 35.3* 33.0* 32.6*  MCV  --  84.9 85.7 84.9  PLT  --  142* 142* 108*   Cardiac Enzymes: No results for input(s): CKTOTAL, CKMB, CKMBINDEX, TROPONINI in the last 168 hours. BNP (last 3 results) No results for input(s): BNP in the last 8760 hours.  ProBNP (last 3 results) No results for input(s): PROBNP in the last 8760 hours.  CBG:  Recent Labs Lab 10/20/15 1130 10/20/15 1625 10/20/15 2105 10/21/15 0742 10/21/15 1133  GLUCAP 131* 133* 171* 118* 127*    Recent Results (from the past 240 hour(s))  Blood Culture (routine x 2)     Status: None (Preliminary result)   Collection Time: 10/19/15  6:33 PM  Result Value Ref Range Status   Specimen Description BLOOD RIGHT ANTECUBITAL  Final   Special Requests BOTTLES DRAWN AEROBIC AND ANAEROBIC 4CC  Final   Culture NO GROWTH 2 DAYS  Final   Report Status PENDING  Incomplete  Blood Culture (routine x 2)     Status: None (Preliminary result)    Collection Time: 10/19/15  6:38 PM  Result Value Ref Range Status   Specimen Description BLOOD RIGHT HAND  Final   Special Requests BOTTLES DRAWN AEROBIC AND ANAEROBIC 5CC  Final   Culture  Setup Time   Final    GRAM NEGATIVE RODS AEROBIC BOTTLE ONLY CRITICAL RESULT CALLED TO, READ BACK BY AND VERIFIED WITH: Jani Gravel RN 2008 10/20/15 A BROWNING    Culture NO GROWTH 2 DAYS  Final   Report Status PENDING  Incomplete  Urine culture     Status: None (Preliminary result)   Collection Time: 10/19/15  6:59 PM  Result Value Ref Range Status   Specimen Description URINE, RANDOM  Final   Special Requests ADDED 2212  Final   Culture >=100,000 COLONIES/mL GRAM NEGATIVE RODS  Final   Report Status PENDING  Incomplete  MRSA PCR Screening     Status: None   Collection Time: 10/19/15 11:02 PM  Result Value Ref Range Status   MRSA by PCR NEGATIVE NEGATIVE Final    Comment:        The GeneXpert MRSA Assay (FDA approved for NASAL specimens only), is one  component of a comprehensive MRSA colonization surveillance program. It is not intended to diagnose MRSA infection nor to guide or monitor treatment for MRSA infections.      Studies:  Recent x-ray studies have been reviewed in detail by the Attending Physician  Scheduled Meds:  Scheduled Meds: . aspirin EC  81 mg Oral Daily  . cefTAZidime (FORTAZ)  IV  2 g Intravenous 3 times per day  . enoxaparin (LOVENOX) injection  40 mg Subcutaneous Q24H  . insulin aspart  0-5 Units Subcutaneous QHS  . insulin aspart  0-9 Units Subcutaneous TID WC  . tamsulosin  0.4 mg Oral QHS    Time spent on care of this patient: 40 mins   Intisar Claudio, Geraldo Docker , MD  [facility] Office  [phone] Pager - [phone]  On-Call/Text Page:      [email or web address]      password [email or web address]  If 7PM-7AM, please contact night-coverage [email or web address] Password [email or web address] 10/21/2015, 2:13 PM   LOS: 2 days   Care during the described time interval was provided by me .  I  have reviewed this patient's available data, including medical history, events of note, physical examination, and all test results as part of my evaluation. I have personally reviewed and interpreted all radiology studies.   Dia Crawford, MD [phone] Pager

## 2015-10-22 LAB — CBC
HCT: 31.2 % — ABNORMAL LOW (ref 39.0–52.0)
Hemoglobin: 10.5 g/dL — ABNORMAL LOW (ref 13.0–17.0)
MCH: 28.5 pg (ref 26.0–34.0)
MCHC: 33.7 g/dL (ref 30.0–36.0)
MCV: 84.6 fL (ref 78.0–100.0)
PLATELETS: 138 10*3/uL — AB (ref 150–400)
RBC: 3.69 MIL/uL — ABNORMAL LOW (ref 4.22–5.81)
RDW: 13.4 % (ref 11.5–15.5)
WBC: 8.2 10*3/uL (ref 4.0–10.5)

## 2015-10-22 LAB — BASIC METABOLIC PANEL
Anion gap: 7 (ref 5–15)
BUN: 11 mg/dL (ref 6–20)
CALCIUM: 8.2 mg/dL — AB (ref 8.9–10.3)
CO2: 23 mmol/L (ref 22–32)
CREATININE: 0.91 mg/dL (ref 0.61–1.24)
Chloride: 109 mmol/L (ref 101–111)
GFR calc Af Amer: >60 mL/min (ref >60)
GFR calc non Af Amer: >60 mL/min (ref >60)
GLUCOSE: 108 mg/dL — AB (ref 65–99)
Potassium: 3.9 mmol/L (ref 3.5–5.1)
Sodium: 139 mmol/L (ref 135–145)

## 2015-10-22 LAB — GLUCOSE, CAPILLARY
GLUCOSE-CAPILLARY: 102 mg/dL — AB (ref 65–99)
Glucose-Capillary: 115 mg/dL — ABNORMAL HIGH (ref 65–99)
Glucose-Capillary: 136 mg/dL — ABNORMAL HIGH (ref 65–99)

## 2015-10-22 LAB — CULTURE, BLOOD (ROUTINE X 2)

## 2015-10-22 LAB — HEMOGLOBIN A1C
HEMOGLOBIN A1C: 6.8 % — AB (ref 4.8–5.6)
Mean Plasma Glucose: 148 mg/dL

## 2015-10-22 MED ORDER — METFORMIN HCL 500 MG PO TABS
500.0000 mg | ORAL_TABLET | Freq: Two times a day (BID) | ORAL | Status: DC
  Administered 2015-10-22 – 2015-10-23 (×2): 500 mg via ORAL
  Filled 2015-10-22 (×2): qty 1

## 2015-10-22 MED ORDER — LOSARTAN POTASSIUM 50 MG PO TABS
50.0000 mg | ORAL_TABLET | Freq: Every morning | ORAL | Status: DC
Start: 2015-10-22 — End: 2015-10-23
  Administered 2015-10-22 – 2015-10-23 (×2): 50 mg via ORAL
  Filled 2015-10-22 (×2): qty 1

## 2015-10-22 MED ORDER — CIPROFLOXACIN HCL 500 MG PO TABS
500.0000 mg | ORAL_TABLET | Freq: Two times a day (BID) | ORAL | Status: DC
  Administered 2015-10-23: 500 mg via ORAL
  Filled 2015-10-22: qty 1

## 2015-10-22 NOTE — Progress Notes (Signed)
Patient transferred from 3S08 step down unit. Received report from nurse on unit. IV team placed IV in RFA with NS @ 50. Patient ambulated to bathroom and has urinal at bedside. Introduced self along with tech and patient was given call light and phone with instructions. Will continue to follow patient as needed.

## 2015-10-22 NOTE — Progress Notes (Signed)
UR COMPLETED  

## 2015-10-22 NOTE — Care Management Important Message (Signed)
Important Message  Patient Details  Name: Kevin Suarez MRN: [id number] Date of Birth: 1941/08/22   Medicare Important Message Given:  Yes    Asuncion Shibata P Adithi Gammon 10/22/2015, 1:40 PM

## 2015-10-22 NOTE — Progress Notes (Signed)
[facility] TEAM 1 - Stepdown/ICU TEAM PROGRESS NOTE  ALLENMICHAEL SWALVE [id number] DOB: Jan 10, 1941 DOA: 10/19/2015 PCP: Haywood Pao, MD  Admit HPI / Brief Narrative: 74 y.o. male with a history of DM, HTN, and recurrent nephrolithiasis who presented with fever and dysuria after stone removal.  The patient had a cystolitholapaxy and transurethral prostate incision last Friday, was in hospital overnight with foley, which was removed before discharge. Since discharge, the patient notes suprapubic pain, dysuria and stinging with urination, urinary urgency, and difficulty emptying. When he developed fever he came to the ER.  In the ED the patient was febrile, tachycardic, and had soft blood pressure - urine was +, elevated lactic acid, and AKI. Chest x-ray was clear. CT renal protocol showed no hydronephrosis or drainable fluid collection.   HPI/Subjective: The patient has no new complaints today.  He denies fevers chills shortness breath nausea vomiting or abdominal pain.  He reports that his appetite is fair.  He is urinating without difficulty at this time.    Assessment/Plan:  Sepsis due to UTI / complicated pyelo - Klebsiella pneum bacteremia  source urine - transition to oral abx at time of d/c w/ plan to complete a full 14 days of tx given bacteremia - BP stable and HR improved w/ volume resuscitation - lactate has normalized   AKI  Due to above - resolved w/ volume expansion   Hypokalemia  Corrected   Anemia Chronic - Hgb now stable    Thrombocytopenia Presumed from sepsis - stable   Prostatic Hypertrophy Per Urology "s/p transurthral incision of prostate with cystolithalopexy 10/15/2015 for recurrent bladder stones and outlet obstruction. Passed office trial of void 10/18/2015. He had similar procedure a year ago."   DM2 CBG well controlled - A1c 6.8  Hx of HTN  Holding usual meds for now - follow   Obese - Body mass index is 34.76 kg/(m^2).  Code Status:  FULL Family Communication: no family present at time of exam today   Disposition Plan: transfer to med bed - PT eval - possible d/c home next 24-48hrs   Consultants: Urology   Procedures: none  Antibiotics: Tressie Ellis 12/6 >  DVT prophylaxis: lovenox   Objective: Blood pressure 148/84, pulse 73, temperature 98.2 F (36.8 C), temperature source Oral, resp. rate 18, height 5\' 10"  (1.778 m), weight 109.9 kg (242 lb 4.6 oz), SpO2 99 %.  Intake/Output Summary (Last 24 hours) at 10/22/15 0946 Last data filed at 10/22/15 [id number]  Gross per 24 hour  Intake   2690 ml  Output   1400 ml  Net   1290 ml   Exam: General: No acute respiratory distress - alert and conversant  Lungs: Clear to auscultation bilaterally without wheezes or crackles Cardiovascular: Regular rate and rhythm without murmur gallop or rub  Abdomen: Nontender, protuberent, soft, bowel sounds positive, no rebound, no ascites, no appreciable mass Extremities: No significant cyanosis, clubbing, edema bilateral lower extremities  Data Reviewed: Basic Metabolic Panel:  Recent Labs Lab 10/19/15 1850 10/20/15 0454 10/21/15 0510 10/22/15 0450  NA 137 139 138 139  K 3.4* 4.5 4.5 3.9  CL 105 106 108 109  CO2 21* 25 23 23   GLUCOSE 160* 142* 126* 108*  BUN 20 18 13 11   CREATININE 1.37* 1.27* 0.94 0.91  CALCIUM 9.2 8.2* 8.4* 8.2*    CBC:  Recent Labs Lab 10/19/15 1850 10/20/15 0454 10/21/15 0510 10/22/15 0450  WBC 13.5* 19.2* 10.2 8.2  NEUTROABS 12.8*  --   --   --  HGB 11.8* 10.7* 10.7* 10.5*  HCT 35.3* 33.0* 32.6* 31.2*  MCV 84.9 85.7 84.9 84.6  PLT 142* 142* 108* 138*    Liver Function Tests:  Recent Labs Lab 10/19/15 1850 10/21/15 0510  AST 23 19  ALT 17 14*  ALKPHOS 93 75  BILITOT 1.0 0.4  PROT 6.2* 5.5*  ALBUMIN 3.4* 2.7*   CBG:  Recent Labs Lab 10/21/15 0742 10/21/15 1133 10/21/15 1541 10/21/15 2126 10/22/15 0735  GLUCAP 118* 127* 107* 126* 102*    Recent Results (from the past  240 hour(s))  Blood Culture (routine x 2)     Status: None (Preliminary result)   Collection Time: 10/19/15  6:33 PM  Result Value Ref Range Status   Specimen Description BLOOD RIGHT ANTECUBITAL  Final   Special Requests BOTTLES DRAWN AEROBIC AND ANAEROBIC 4CC  Final   Culture NO GROWTH 2 DAYS  Final   Report Status PENDING  Incomplete  Blood Culture (routine x 2)     Status: None   Collection Time: 10/19/15  6:38 PM  Result Value Ref Range Status   Specimen Description BLOOD RIGHT HAND  Final   Special Requests BOTTLES DRAWN AEROBIC AND ANAEROBIC 5CC  Final   Culture  Setup Time   Final    GRAM NEGATIVE RODS AEROBIC BOTTLE ONLY CRITICAL RESULT CALLED TO, READ BACK BY AND VERIFIED WITH: Jani Gravel RN 2008 10/20/15 A BROWNING    Culture KLEBSIELLA PNEUMONIAE  Final   Report Status 10/22/2015 FINAL  Final   Organism ID, Bacteria KLEBSIELLA PNEUMONIAE  Final      Susceptibility   Klebsiella pneumoniae - MIC*    AMPICILLIN >=32 RESISTANT Resistant     CEFAZOLIN 16 SENSITIVE Sensitive     CEFEPIME <=1 SENSITIVE Sensitive     CEFTAZIDIME <=1 SENSITIVE Sensitive     CEFTRIAXONE <=1 SENSITIVE Sensitive     CIPROFLOXACIN <=0.25 SENSITIVE Sensitive     GENTAMICIN <=1 SENSITIVE Sensitive     IMIPENEM <=0.25 SENSITIVE Sensitive     TRIMETH/SULFA <=20 SENSITIVE Sensitive     AMPICILLIN/SULBACTAM >=32 RESISTANT Resistant     PIP/TAZO 16 SENSITIVE Sensitive     * KLEBSIELLA PNEUMONIAE  Urine culture     Status: None (Preliminary result)   Collection Time: 10/19/15  6:59 PM  Result Value Ref Range Status   Specimen Description URINE, RANDOM  Final   Special Requests ADDED 2212  Final   Culture >=100,000 COLONIES/mL GRAM NEGATIVE RODS  Final   Report Status PENDING  Incomplete  MRSA PCR Screening     Status: None   Collection Time: 10/19/15 11:02 PM  Result Value Ref Range Status   MRSA by PCR NEGATIVE NEGATIVE Final    Comment:        The GeneXpert MRSA Assay (FDA approved for NASAL  specimens only), is one component of a comprehensive MRSA colonization surveillance program. It is not intended to diagnose MRSA infection nor to guide or monitor treatment for MRSA infections.      Studies:   Recent x-ray studies have been reviewed in detail by the Attending Physician  Scheduled Meds:  Scheduled Meds: . aspirin EC  81 mg Oral Daily  . cefTAZidime (FORTAZ)  IV  2 g Intravenous 3 times per day  . enoxaparin (LOVENOX) injection  40 mg Subcutaneous Q24H  . insulin aspart  0-5 Units Subcutaneous QHS  . insulin aspart  0-9 Units Subcutaneous TID WC  . tamsulosin  0.4 mg Oral QHS  Time spent on care of this patient: 35 mins   [facility] T , MD   [facility] Office  [phone] Pager - Text Page per Shea Evans as per below:  On-Call/Text Page:      [email or web address]      password [email or web address]  If 7PM-7AM, please contact night-coverage [email or web address] Password [email or web address] 10/22/2015, 9:46 AM   LOS: 3 days

## 2015-10-23 LAB — GLUCOSE, CAPILLARY
Glucose-Capillary: 116 mg/dL — ABNORMAL HIGH (ref 65–99)
Glucose-Capillary: 109 mg/dL — ABNORMAL HIGH (ref 65–99)

## 2015-10-23 LAB — CBC
HEMATOCRIT: 31.8 % — AB (ref 39.0–52.0)
Hemoglobin: 10.6 g/dL — ABNORMAL LOW (ref 13.0–17.0)
MCH: 28 pg (ref 26.0–34.0)
MCHC: 33.3 g/dL (ref 30.0–36.0)
MCV: 83.9 fL (ref 78.0–100.0)
Platelets: 152 10*3/uL (ref 150–400)
RBC: 3.79 MIL/uL — AB (ref 4.22–5.81)
RDW: 13.2 % (ref 11.5–15.5)
WBC: 6.2 10*3/uL (ref 4.0–10.5)

## 2015-10-23 LAB — URINE CULTURE: Culture: >=100000

## 2015-10-23 LAB — BASIC METABOLIC PANEL
ANION GAP: 8 (ref 5–15)
BUN: 11 mg/dL (ref 6–20)
CHLORIDE: 107 mmol/L (ref 101–111)
CO2: 25 mmol/L (ref 22–32)
Calcium: 8.9 mg/dL (ref 8.9–10.3)
Creatinine, Ser: 0.94 mg/dL (ref 0.61–1.24)
GFR calc Af Amer: >60 mL/min (ref >60)
GFR calc non Af Amer: >60 mL/min (ref >60)
GLUCOSE: 122 mg/dL — AB (ref 65–99)
POTASSIUM: 4.1 mmol/L (ref 3.5–5.1)
Sodium: 140 mmol/L (ref 135–145)

## 2015-10-23 MED ORDER — CIPROFLOXACIN HCL 500 MG PO TABS: 500.0000 mg | ORAL_TABLET | Freq: Two times a day (BID) | ORAL | Status: DC

## 2015-10-23 NOTE — Evaluation (Signed)
Physical Therapy Evaluation Patient Details Name: ZLATAN VOLKOV MRN: [id number] DOB: 12/11/1940 Today's Date: 10/23/2015   History of Present Illness  pt is a 74 y/o male with h/o BPH, DM2, HTN kidney stones, admitted with fever and dysuria after recent stone removal.  Work up for sepsis.  Clinical Impression  Pt is at  baseline functioning and should be safe at home at Independent level. There are no further acute PT needs.  Will sign off at this time.     Follow Up Recommendations No PT follow up    Equipment Recommendations  None recommended by PT    Recommendations for Other Services       Precautions / Restrictions        Mobility  Bed Mobility Overal bed mobility: Independent                Transfers Overall transfer level: Independent                  Ambulation/Gait Ambulation/Gait assistance: Independent Ambulation Distance (Feet): 350 Feet     Gait velocity: age appropriate Gait velocity interpretation: at or above normal speed for age/gender General Gait Details: steady and fluid  Stairs Stairs: Yes Stairs assistance: Independent   Number of Stairs: 4 General stair comments: safe with or without stairs  Wheelchair Mobility    Modified Rankin (Stroke Patients Only)       Balance Overall balance assessment: No apparent balance deficits (not formally assessed)                               Standardized Balance Assessment Standardized Balance Assessment : Dynamic Gait Index   Dynamic Gait Index Level Surface: Normal Change in Gait Speed: Normal Gait with Horizontal Head Turns: Normal Gait with Vertical Head Turns: Normal Gait and Pivot Turn: Normal Step Over Obstacle: Normal Step Around Obstacles: Normal Steps: Mild Impairment Total Score: 23       Pertinent Vitals/Pain Pain Assessment: No/denies pain    Home Living Family/patient expects to be discharged to:: Private residence Living Arrangements:  Spouse/significant other Available Help at Discharge: Family Type of Home: House Home Access: Level entry     Home Layout: One level Home Equipment: Environmental consultant - 2 wheels      Prior Function                 Hand Dominance        Extremity/Trunk Assessment   Upper Extremity Assessment: Overall WFL for tasks assessed           Lower Extremity Assessment: Overall WFL for tasks assessed (mild weakness proximally)         Communication   Communication: No difficulties  Cognition Arousal/Alertness: Awake/alert Behavior During Therapy: WFL for tasks assessed/performed Overall Cognitive Status: Within Functional Limits for tasks assessed                      General Comments      Exercises        Assessment/Plan    PT Assessment Patent does not need any further PT services  PT Diagnosis     PT Problem List    PT Treatment Interventions     PT Goals (Current goals can be found in the Care Plan section) Acute Rehab PT Goals PT Goal Formulation: All assessment and education complete, DC therapy    Frequency     Barriers to  discharge        Co-evaluation               End of Session   Activity Tolerance: Patient tolerated treatment well Patient left: in bed;with family/visitor present Nurse Communication: Mobility status         Time: 0955-1010 PT Time Calculation (min) (ACUTE ONLY): 15 min   Charges:   PT Evaluation $Initial PT Evaluation Tier I: 1 Procedure     PT G Codes:        Evelina Lore, Tessie Fass 10/23/2015, 10:24 AM 10/23/2015  Donnella Sham, [place] [phone]  (pager)

## 2015-10-23 NOTE — Discharge Instructions (Signed)

## 2015-10-23 NOTE — Discharge Summary (Addendum)
DISCHARGE SUMMARY  Kevin Suarez  MR#: [id number]  DOB:1941-07-15  Date of Admission: 10/19/2015 Date of Discharge: 10/23/2015  Attending Physician:MCCLUNG,JEFFREY T  Patient's [id number] Kevin Hughs, MD  Consults: Urology   Disposition: D/C home   Follow-up Appts:     Follow-up Information    Follow up with Haywood Pao, MD. Schedule an appointment as soon as possible for a visit in 1 week.   Specialty:  Internal Medicine   Contact information:   [place] [id number] [phone]       Follow up with Claybon Jabs, MD.   Specialty:  Urology   Why:  Keep your previously scheduled appointment for next week.     Contact information:   [place]  [id number] [phone]      Discharge Diagnoses: Sepsis due to UTI / complicated pyelo likely related to his recent foley as well as instrumentation to remove his stone Klebsiella pneum bacteremia related to above  AKI  Hypokalemia  Anemia Thrombocytopenia Prostatic Hypertrophy DM2 Hx of HTN  Obese - Body mass index is 34.76 kg/(m^2)  Initial presentation: 74 y.o. male with a history of DM, HTN, and recurrent nephrolithiasis who presented with fever and dysuria after stone removal. The patient had a cystolitholapaxy and transurethral prostate incision last Friday, was in hospital overnight with foley, which was removed before discharge. Since discharge, the patient notes suprapubic pain, dysuria and stinging with urination, urinary urgency, and difficulty emptying. When he developed fever he came to the ER.  In the ED the patient was febrile, tachycardic, and had soft blood pressure - urine was +, elevated lactic acid, and AKI. Chest x-ray was clear. CT renal protocol showed no hydronephrosis or drainable fluid collection.   Hospital Course:  Sepsis due to UTI / complicated pyelo - Klebsiella pneum bacteremia  source urine - transition to oral abx at time of d/c w/ plan to  complete a full 14 days of tx given bacteremia - BP stable and HR improved w/ volume resuscitation - lactate has normalized   AKI  Due to above - resolved w/ volume expansion   Hypokalemia  Corrected   Anemia Chronic - Hgb stableat time of d/c   Thrombocytopenia Due to gram neg rod sepsis - stable at time of d/c   Prostatic Hypertrophy Per Urology "s/p transurthral incision of prostate with cystolithalopexy 10/15/2015 for recurrent bladder stones and outlet obstruction. Passed office trial of void 10/18/2015. He had similar procedure a year ago."  - to f/u w/ Urology as outpt   DM2 CBG well controlled - A1c 6.8 - resume usual home tx at time of d/c   Hx of HTN  Resumed usual home tx at time of d/c   Obese - Body mass index is 34.76 kg/(m^2).    Medication List    TAKE these medications        aspirin EC 81 MG tablet  Take 81 mg by mouth daily.     ciprofloxacin 500 MG tablet  Commonly known as:  CIPRO  Take 1 tablet (500 mg total) by mouth 2 (two) times daily.     HYDROcodone-acetaminophen 10-325 MG tablet  Commonly known as:  NORCO  Take 1-2 tablets by mouth every 4 (four) hours as needed for moderate pain. Maximum dose per 24 hours - 8 pills     losartan 50 MG tablet  Commonly known as:  COZAAR  Take 50 mg by mouth every morning.  metFORMIN 500 MG tablet  Commonly known as:  GLUCOPHAGE  Take 500 mg by mouth 2 (two) times daily with a meal.     phenazopyridine 200 MG tablet  Commonly known as:  PYRIDIUM  Take 1 tablet (200 mg total) by mouth 3 (three) times daily as needed for pain.     tamsulosin 0.4 MG Caps capsule  Commonly known as:  FLOMAX  Take 0.4 mg by mouth at bedtime.     URIBEL 118 MG Caps  Take 118 mg by mouth daily.        Day of Discharge BP 153/73 mmHg  Pulse 74  Temp(Src) 97.6 F (36.4 C) (Oral)  Resp 18  Ht 5\' 10"  (1.778 m)  Wt 109.9 kg (242 lb 4.6 oz)  BMI 34.76 kg/m2  SpO2 96%  Physical Exam: General: No acute  respiratory distress Lungs: Clear to auscultation bilaterally without wheezes or crackles Cardiovascular: Regular rate and rhythm without murmur gallop or rub normal S1 and S2 Abdomen: Nontender, nondistended, soft, bowel sounds positive, no rebound, no ascites, no appreciable mass Extremities: No significant cyanosis, clubbing, or edema bilateral lower extremities  Basic Metabolic Panel:  Recent Labs Lab 10/19/15 1850 10/20/15 0454 10/21/15 0510 10/22/15 0450 10/23/15 0439  NA 137 139 138 139 140  K 3.4* 4.5 4.5 3.9 4.1  CL 105 106 108 109 107  CO2 21* 25 23 23 25   GLUCOSE 160* 142* 126* 108* 122*  BUN 20 18 13 11 11   CREATININE 1.37* 1.27* 0.94 0.91 0.94  CALCIUM 9.2 8.2* 8.4* 8.2* 8.9    Liver Function Tests:  Recent Labs Lab 10/19/15 1850 10/21/15 0510  AST 23 19  ALT 17 14*  ALKPHOS 93 75  BILITOT 1.0 0.4  PROT 6.2* 5.5*  ALBUMIN 3.4* 2.7*   CBC:  Recent Labs Lab 10/19/15 1850 10/20/15 0454 10/21/15 0510 10/22/15 0450 10/23/15 0439  WBC 13.5* 19.2* 10.2 8.2 6.2  NEUTROABS 12.8*  --   --   --   --   HGB 11.8* 10.7* 10.7* 10.5* 10.6*  HCT 35.3* 33.0* 32.6* 31.2* 31.8*  MCV 84.9 85.7 84.9 84.6 83.9  PLT 142* 142* 108* 138* 152    CBG:  Recent Labs Lab 10/22/15 0735 10/22/15 1147 10/22/15 2111 10/23/15 0744 10/23/15 1152  GLUCAP 102* 115* 136* 116* 109*    Recent Results (from the past 240 hour(s))  Blood Culture (routine x 2)     Status: None (Preliminary result)   Collection Time: 10/19/15  6:33 PM  Result Value Ref Range Status   Specimen Description BLOOD RIGHT ANTECUBITAL  Final   Special Requests BOTTLES DRAWN AEROBIC AND ANAEROBIC 4CC  Final   Culture NO GROWTH 4 DAYS  Final   Report Status PENDING  Incomplete  Blood Culture (routine x 2)     Status: None   Collection Time: 10/19/15  6:38 PM  Result Value Ref Range Status   Specimen Description BLOOD RIGHT HAND  Final   Special Requests BOTTLES DRAWN AEROBIC AND ANAEROBIC 5CC   Final   Culture  Setup Time   Final    GRAM NEGATIVE RODS AEROBIC BOTTLE ONLY CRITICAL RESULT CALLED TO, READ BACK BY AND VERIFIED WITH: Jani Gravel RN 2008 10/20/15 A BROWNING    Culture KLEBSIELLA PNEUMONIAE  Final   Report Status 10/22/2015 FINAL  Final   Organism ID, Bacteria KLEBSIELLA PNEUMONIAE  Final      Susceptibility   Klebsiella pneumoniae - MIC*    AMPICILLIN >=32 RESISTANT  Resistant     CEFAZOLIN 16 SENSITIVE Sensitive     CEFEPIME <=1 SENSITIVE Sensitive     CEFTAZIDIME <=1 SENSITIVE Sensitive     CEFTRIAXONE <=1 SENSITIVE Sensitive     CIPROFLOXACIN <=0.25 SENSITIVE Sensitive     GENTAMICIN <=1 SENSITIVE Sensitive     IMIPENEM <=0.25 SENSITIVE Sensitive     TRIMETH/SULFA <=20 SENSITIVE Sensitive     AMPICILLIN/SULBACTAM >=32 RESISTANT Resistant     PIP/TAZO 16 SENSITIVE Sensitive     * KLEBSIELLA PNEUMONIAE  Urine culture     Status: None   Collection Time: 10/19/15  6:59 PM  Result Value Ref Range Status   Specimen Description URINE, RANDOM  Final   Special Requests ADDED 2212  Final   Culture   Final    >=100,000 COLONIES/mL KLEBSIELLA PNEUMONIAE >=100,000 COLONIES/mL ESCHERICHIA COLI    Report Status 10/23/2015 FINAL  Final   Organism ID, Bacteria KLEBSIELLA PNEUMONIAE  Final   Organism ID, Bacteria ESCHERICHIA COLI  Final      Susceptibility   Escherichia coli - MIC*    AMPICILLIN <=2 SENSITIVE Sensitive     CEFAZOLIN <=4 SENSITIVE Sensitive     CEFTRIAXONE <=1 SENSITIVE Sensitive     CIPROFLOXACIN <=0.25 SENSITIVE Sensitive     GENTAMICIN <=1 SENSITIVE Sensitive     IMIPENEM <=0.25 SENSITIVE Sensitive     NITROFURANTOIN <=16 SENSITIVE Sensitive     TRIMETH/SULFA <=20 SENSITIVE Sensitive     AMPICILLIN/SULBACTAM <=2 SENSITIVE Sensitive     PIP/TAZO <=4 SENSITIVE Sensitive     * >=100,000 COLONIES/mL ESCHERICHIA COLI   Klebsiella pneumoniae - MIC*    AMPICILLIN >=32 RESISTANT Resistant     CEFAZOLIN <=4 SENSITIVE Sensitive     CEFTRIAXONE <=1  SENSITIVE Sensitive     CIPROFLOXACIN <=0.25 SENSITIVE Sensitive     GENTAMICIN <=1 SENSITIVE Sensitive     IMIPENEM <=0.25 SENSITIVE Sensitive     NITROFURANTOIN 32 SENSITIVE Sensitive     TRIMETH/SULFA <=20 SENSITIVE Sensitive     AMPICILLIN/SULBACTAM >=32 RESISTANT Resistant     PIP/TAZO 32 INTERMEDIATE Intermediate     * >=100,000 COLONIES/mL KLEBSIELLA PNEUMONIAE  MRSA PCR Screening     Status: None   Collection Time: 10/19/15 11:02 PM  Result Value Ref Range Status   MRSA by PCR NEGATIVE NEGATIVE Final    Comment:        The GeneXpert MRSA Assay (FDA approved for NASAL specimens only), is one component of a comprehensive MRSA colonization surveillance program. It is not intended to diagnose MRSA infection nor to guide or monitor treatment for MRSA infections.     Time spent in discharge (includes decision making & examination of pt): >35 minutes  10/23/2015, 12:09 PM   Cherene Altes, MD [facility] Office  [phone] Pager [phone]  On-Call/Text Page:      [email or web address]      password [facility]

## 2015-10-23 NOTE — Progress Notes (Signed)
Nsg Discharge Note  Admit Date:  10/19/2015 Discharge date: 10/23/2015   Volney Buter Mehra to be D/C'd Home per MD order.  AVS completed.  Copy for chart, and copy for patient signed, and dated. Patient/caregiver able to verbalize understanding.  Discharge Medication:   Medication List    TAKE these medications        aspirin EC 81 MG tablet  Take 81 mg by mouth daily.     ciprofloxacin 500 MG tablet  Commonly known as:  CIPRO  Take 1 tablet (500 mg total) by mouth 2 (two) times daily.     HYDROcodone-acetaminophen 10-325 MG tablet  Commonly known as:  NORCO  Take 1-2 tablets by mouth every 4 (four) hours as needed for moderate pain. Maximum dose per 24 hours - 8 pills     losartan 50 MG tablet  Commonly known as:  COZAAR  Take 50 mg by mouth every morning.     metFORMIN 500 MG tablet  Commonly known as:  GLUCOPHAGE  Take 500 mg by mouth 2 (two) times daily with a meal.     phenazopyridine 200 MG tablet  Commonly known as:  PYRIDIUM  Take 1 tablet (200 mg total) by mouth 3 (three) times daily as needed for pain.     tamsulosin 0.4 MG Caps capsule  Commonly known as:  FLOMAX  Take 0.4 mg by mouth at bedtime.     URIBEL 118 MG Caps  Take 118 mg by mouth daily.        Discharge Assessment: Filed Vitals:   10/22/15 2102 10/23/15 0546  BP: 143/68 153/73  Pulse: 72 74  Temp: 98.1 F (36.7 C) 97.6 F (36.4 C)  Resp: 20 18   Skin clean, dry and intact without evidence of skin break down, no evidence of skin tears noted. IV catheter discontinued intact. Site without signs and symptoms of complications - no redness or edema noted at insertion site, patient denies c/o pain - only slight tenderness at site.  Dressing with slight pressure applied.  D/c Instructions-Education: Discharge instructions given to patient/family with verbalized understanding. D/c education completed with patient/family including follow up instructions, medication list, d/c activities limitations  if indicated, with other d/c instructions as indicated by MD - patient able to verbalize understanding, all questions fully answered. Patient instructed to return to ED, call 911, or call MD for any changes in condition.  Patient escorted via [place], and D/C home via private auto.  Salley Slaughter, RN 10/23/2015 1:31 PM

## 2015-10-25 LAB — CULTURE, BLOOD (ROUTINE X 2)

## 2015-10-27 LAB — METHYLMALONIC ACID, SERUM: Methylmalonic Acid, Quantitative: 348 nmol/L (ref 0–378)

## 2015-10-28 DIAGNOSIS — N401 Enlarged prostate with lower urinary tract symptoms: Secondary | ICD-10-CM | POA: Diagnosis not present

## 2015-10-28 DIAGNOSIS — R3914 Feeling of incomplete bladder emptying: Secondary | ICD-10-CM | POA: Diagnosis not present

## 2015-11-08 DIAGNOSIS — R69 Illness, unspecified: Secondary | ICD-10-CM | POA: Diagnosis not present

## 2015-11-24 DIAGNOSIS — R69 Illness, unspecified: Secondary | ICD-10-CM | POA: Diagnosis not present

## 2015-11-29 DIAGNOSIS — E1129 Type 2 diabetes mellitus with other diabetic kidney complication: Secondary | ICD-10-CM | POA: Diagnosis not present

## 2015-11-29 DIAGNOSIS — I10 Essential (primary) hypertension: Secondary | ICD-10-CM | POA: Diagnosis not present

## 2015-11-29 DIAGNOSIS — Z0000 Encounter for general adult medical examination without abnormal findings: Secondary | ICD-10-CM | POA: Diagnosis not present

## 2015-12-06 DIAGNOSIS — N182 Chronic kidney disease, stage 2 (mild): Secondary | ICD-10-CM | POA: Diagnosis not present

## 2015-12-06 DIAGNOSIS — H9190 Unspecified hearing loss, unspecified ear: Secondary | ICD-10-CM | POA: Diagnosis not present

## 2015-12-06 DIAGNOSIS — R69 Illness, unspecified: Secondary | ICD-10-CM | POA: Diagnosis not present

## 2015-12-06 DIAGNOSIS — Z6834 Body mass index (BMI) 34.0-34.9, adult: Secondary | ICD-10-CM | POA: Diagnosis not present

## 2015-12-06 DIAGNOSIS — E784 Other hyperlipidemia: Secondary | ICD-10-CM | POA: Diagnosis not present

## 2015-12-06 DIAGNOSIS — K219 Gastro-esophageal reflux disease without esophagitis: Secondary | ICD-10-CM | POA: Diagnosis not present

## 2015-12-06 DIAGNOSIS — R808 Other proteinuria: Secondary | ICD-10-CM | POA: Diagnosis not present

## 2015-12-06 DIAGNOSIS — E1129 Type 2 diabetes mellitus with other diabetic kidney complication: Secondary | ICD-10-CM | POA: Diagnosis not present

## 2015-12-06 DIAGNOSIS — R972 Elevated prostate specific antigen [PSA]: Secondary | ICD-10-CM | POA: Diagnosis not present

## 2015-12-06 DIAGNOSIS — N401 Enlarged prostate with lower urinary tract symptoms: Secondary | ICD-10-CM | POA: Diagnosis not present

## 2015-12-06 DIAGNOSIS — Z0000 Encounter for general adult medical examination without abnormal findings: Secondary | ICD-10-CM | POA: Diagnosis not present

## 2015-12-08 DIAGNOSIS — R69 Illness, unspecified: Secondary | ICD-10-CM | POA: Diagnosis not present

## 2015-12-15 DIAGNOSIS — Z1212 Encounter for screening for malignant neoplasm of rectum: Secondary | ICD-10-CM | POA: Diagnosis not present

## 2016-01-04 DIAGNOSIS — R69 Illness, unspecified: Secondary | ICD-10-CM | POA: Diagnosis not present

## 2016-02-03 DIAGNOSIS — R69 Illness, unspecified: Secondary | ICD-10-CM | POA: Diagnosis not present

## 2016-02-17 DIAGNOSIS — Z0000 Encounter for general adult medical examination without abnormal findings: Secondary | ICD-10-CM | POA: Diagnosis not present

## 2016-02-17 DIAGNOSIS — N3281 Overactive bladder: Secondary | ICD-10-CM | POA: Diagnosis not present

## 2016-02-17 DIAGNOSIS — N401 Enlarged prostate with lower urinary tract symptoms: Secondary | ICD-10-CM | POA: Diagnosis not present

## 2016-02-17 DIAGNOSIS — N138 Other obstructive and reflux uropathy: Secondary | ICD-10-CM | POA: Diagnosis not present

## 2016-02-23 DIAGNOSIS — H5319 Other subjective visual disturbances: Secondary | ICD-10-CM | POA: Diagnosis not present

## 2016-02-23 DIAGNOSIS — H5310 Unspecified subjective visual disturbances: Secondary | ICD-10-CM | POA: Diagnosis not present

## 2016-02-23 DIAGNOSIS — H40013 Open angle with borderline findings, low risk, bilateral: Secondary | ICD-10-CM | POA: Diagnosis not present

## 2016-03-02 DIAGNOSIS — R69 Illness, unspecified: Secondary | ICD-10-CM | POA: Diagnosis not present

## 2016-03-30 DIAGNOSIS — R69 Illness, unspecified: Secondary | ICD-10-CM | POA: Diagnosis not present

## 2016-04-27 DIAGNOSIS — R69 Illness, unspecified: Secondary | ICD-10-CM | POA: Diagnosis not present

## 2016-05-17 DIAGNOSIS — Z0000 Encounter for general adult medical examination without abnormal findings: Secondary | ICD-10-CM | POA: Diagnosis not present

## 2016-05-17 DIAGNOSIS — E119 Type 2 diabetes mellitus without complications: Secondary | ICD-10-CM | POA: Diagnosis not present

## 2016-05-17 DIAGNOSIS — I10 Essential (primary) hypertension: Secondary | ICD-10-CM | POA: Diagnosis not present

## 2016-05-25 DIAGNOSIS — R69 Illness, unspecified: Secondary | ICD-10-CM | POA: Diagnosis not present

## 2016-06-06 DIAGNOSIS — N201 Calculus of ureter: Secondary | ICD-10-CM | POA: Diagnosis not present

## 2016-06-06 DIAGNOSIS — Z7689 Persons encountering health services in other specified circumstances: Secondary | ICD-10-CM | POA: Diagnosis not present

## 2016-06-06 DIAGNOSIS — H9190 Unspecified hearing loss, unspecified ear: Secondary | ICD-10-CM | POA: Diagnosis not present

## 2016-06-06 DIAGNOSIS — R808 Other proteinuria: Secondary | ICD-10-CM | POA: Diagnosis not present

## 2016-06-06 DIAGNOSIS — R972 Elevated prostate specific antigen [PSA]: Secondary | ICD-10-CM | POA: Diagnosis not present

## 2016-06-06 DIAGNOSIS — I10 Essential (primary) hypertension: Secondary | ICD-10-CM | POA: Diagnosis not present

## 2016-06-06 DIAGNOSIS — Z6835 Body mass index (BMI) 35.0-35.9, adult: Secondary | ICD-10-CM | POA: Diagnosis not present

## 2016-06-06 DIAGNOSIS — E1129 Type 2 diabetes mellitus with other diabetic kidney complication: Secondary | ICD-10-CM | POA: Diagnosis not present

## 2016-06-06 DIAGNOSIS — E668 Other obesity: Secondary | ICD-10-CM | POA: Diagnosis not present

## 2016-06-06 DIAGNOSIS — N182 Chronic kidney disease, stage 2 (mild): Secondary | ICD-10-CM | POA: Diagnosis not present

## 2016-06-06 DIAGNOSIS — N401 Enlarged prostate with lower urinary tract symptoms: Secondary | ICD-10-CM | POA: Diagnosis not present

## 2016-06-13 DIAGNOSIS — E538 Deficiency of other specified B group vitamins: Secondary | ICD-10-CM | POA: Diagnosis not present

## 2016-07-19 DIAGNOSIS — Z23 Encounter for immunization: Secondary | ICD-10-CM | POA: Diagnosis not present

## 2016-07-19 DIAGNOSIS — E538 Deficiency of other specified B group vitamins: Secondary | ICD-10-CM | POA: Diagnosis not present

## 2016-08-16 DIAGNOSIS — E538 Deficiency of other specified B group vitamins: Secondary | ICD-10-CM | POA: Diagnosis not present

## 2016-09-21 DIAGNOSIS — H5319 Other subjective visual disturbances: Secondary | ICD-10-CM | POA: Diagnosis not present

## 2016-09-21 DIAGNOSIS — E119 Type 2 diabetes mellitus without complications: Secondary | ICD-10-CM | POA: Diagnosis not present

## 2016-09-21 DIAGNOSIS — H40013 Open angle with borderline findings, low risk, bilateral: Secondary | ICD-10-CM | POA: Diagnosis not present

## 2016-09-21 DIAGNOSIS — Z961 Presence of intraocular lens: Secondary | ICD-10-CM | POA: Diagnosis not present

## 2016-11-07 DIAGNOSIS — R69 Illness, unspecified: Secondary | ICD-10-CM | POA: Diagnosis not present

## 2016-11-08 DIAGNOSIS — R69 Illness, unspecified: Secondary | ICD-10-CM | POA: Diagnosis not present

## 2016-11-09 DIAGNOSIS — R69 Illness, unspecified: Secondary | ICD-10-CM | POA: Diagnosis not present

## 2016-12-01 DIAGNOSIS — E538 Deficiency of other specified B group vitamins: Secondary | ICD-10-CM | POA: Diagnosis not present

## 2016-12-01 DIAGNOSIS — E1129 Type 2 diabetes mellitus with other diabetic kidney complication: Secondary | ICD-10-CM | POA: Diagnosis not present

## 2016-12-01 DIAGNOSIS — R69 Illness, unspecified: Secondary | ICD-10-CM | POA: Diagnosis not present

## 2016-12-01 DIAGNOSIS — R784 Finding of other drugs of addictive potential in blood: Secondary | ICD-10-CM | POA: Diagnosis not present

## 2016-12-01 DIAGNOSIS — R8299 Other abnormal findings in urine: Secondary | ICD-10-CM | POA: Diagnosis not present

## 2016-12-01 DIAGNOSIS — Z125 Encounter for screening for malignant neoplasm of prostate: Secondary | ICD-10-CM | POA: Diagnosis not present

## 2016-12-06 DIAGNOSIS — Z0000 Encounter for general adult medical examination without abnormal findings: Secondary | ICD-10-CM | POA: Diagnosis not present

## 2016-12-06 DIAGNOSIS — N400 Enlarged prostate without lower urinary tract symptoms: Secondary | ICD-10-CM | POA: Diagnosis not present

## 2016-12-06 DIAGNOSIS — E1165 Type 2 diabetes mellitus with hyperglycemia: Secondary | ICD-10-CM | POA: Diagnosis not present

## 2016-12-06 DIAGNOSIS — I10 Essential (primary) hypertension: Secondary | ICD-10-CM | POA: Diagnosis not present

## 2016-12-06 DIAGNOSIS — M13852 Other specified arthritis, left hip: Secondary | ICD-10-CM | POA: Diagnosis not present

## 2016-12-06 DIAGNOSIS — H9319 Tinnitus, unspecified ear: Secondary | ICD-10-CM | POA: Diagnosis not present

## 2016-12-06 DIAGNOSIS — R233 Spontaneous ecchymoses: Secondary | ICD-10-CM | POA: Diagnosis not present

## 2016-12-06 DIAGNOSIS — Z7982 Long term (current) use of aspirin: Secondary | ICD-10-CM | POA: Diagnosis not present

## 2016-12-06 DIAGNOSIS — E669 Obesity, unspecified: Secondary | ICD-10-CM | POA: Diagnosis not present

## 2016-12-06 DIAGNOSIS — R69 Illness, unspecified: Secondary | ICD-10-CM | POA: Diagnosis not present

## 2016-12-06 DIAGNOSIS — M13851 Other specified arthritis, right hip: Secondary | ICD-10-CM | POA: Diagnosis not present

## 2016-12-07 DIAGNOSIS — E1140 Type 2 diabetes mellitus with diabetic neuropathy, unspecified: Secondary | ICD-10-CM | POA: Diagnosis not present

## 2016-12-07 DIAGNOSIS — N182 Chronic kidney disease, stage 2 (mild): Secondary | ICD-10-CM | POA: Diagnosis not present

## 2016-12-07 DIAGNOSIS — E538 Deficiency of other specified B group vitamins: Secondary | ICD-10-CM | POA: Diagnosis not present

## 2016-12-07 DIAGNOSIS — I10 Essential (primary) hypertension: Secondary | ICD-10-CM | POA: Diagnosis not present

## 2016-12-07 DIAGNOSIS — Z0000 Encounter for general adult medical examination without abnormal findings: Secondary | ICD-10-CM | POA: Diagnosis not present

## 2016-12-07 DIAGNOSIS — R972 Elevated prostate specific antigen [PSA]: Secondary | ICD-10-CM | POA: Diagnosis not present

## 2016-12-07 DIAGNOSIS — N401 Enlarged prostate with lower urinary tract symptoms: Secondary | ICD-10-CM | POA: Diagnosis not present

## 2016-12-07 DIAGNOSIS — E1129 Type 2 diabetes mellitus with other diabetic kidney complication: Secondary | ICD-10-CM | POA: Diagnosis not present

## 2016-12-07 DIAGNOSIS — R808 Other proteinuria: Secondary | ICD-10-CM | POA: Diagnosis not present

## 2016-12-07 DIAGNOSIS — E668 Other obesity: Secondary | ICD-10-CM | POA: Diagnosis not present

## 2016-12-15 ENCOUNTER — Encounter: Payer: Self-pay | Admitting: Podiatry

## 2016-12-15 ENCOUNTER — Ambulatory Visit (INDEPENDENT_AMBULATORY_CARE_PROVIDER_SITE_OTHER): Payer: Medicare HMO | Admitting: Podiatry

## 2016-12-15 DIAGNOSIS — M79675 Pain in left toe(s): Secondary | ICD-10-CM | POA: Diagnosis not present

## 2016-12-15 DIAGNOSIS — B351 Tinea unguium: Secondary | ICD-10-CM | POA: Diagnosis not present

## 2016-12-15 DIAGNOSIS — M79674 Pain in right toe(s): Secondary | ICD-10-CM | POA: Diagnosis not present

## 2016-12-15 DIAGNOSIS — Q828 Other specified congenital malformations of skin: Secondary | ICD-10-CM

## 2016-12-15 DIAGNOSIS — E1149 Type 2 diabetes mellitus with other diabetic neurological complication: Secondary | ICD-10-CM

## 2016-12-15 MED ORDER — NONFORMULARY OR COMPOUNDED ITEM: 2 refills | Status: DC

## 2016-12-15 NOTE — Addendum Note (Signed)
Addended by: Harriett Sine D on: 12/15/2016 01:25 PM   Modules accepted: Orders

## 2016-12-15 NOTE — Progress Notes (Signed)
Subjective: 76 y.o. returns the office today for painful, elongated, thickened toenails which they cannot trim themself. Denies any redness or drainage around the nails.He also has 2 "hard places" on the bottom of his right foot that he would like trimmed as they are painful. He gets some pain to his feet at night and sharp pains at times. Denies any acute changes since last appointment and no new complaints today. Denies any systemic complaints such as fevers, chills, nausea, vomiting.   Objective: AAO 3, NAD DP/PT pulses palpable, CRT less than 3 seconds Protective sensation mildly decreased with Simms Weinstein monofilament Nails hypertrophic, dystrophic, elongated, brittle, discolored 10. There is tenderness overlying the nails 1-5 bilaterally. There is no surrounding erythema or drainage along the nail sites. Hyperkeatotic lesions on the plantar aspect of the right midfoot and heel. Upon debridement no underlying ulceration, drainage, signs of infection.  Mild hammertoes present.  No open lesions or pre-ulcerative lesions are identified. No other areas of tenderness bilateral lower extremities. No overlying edema, erythema, increased warmth. No pain with calf compression, swelling, warmth, erythema.  Assessment: Patient presents with symptomatic onychomycosis; hyperkeratotic lesions x2; type II diabetes mellitus with neuropathy   Plan: -Treatment options including alternatives, risks, complications were discussed -Nails sharply debrided 10 without complication/bleeding. -Hyperkeratotic lesions sharply debrided x 2 without complications or bleeding.  -Ordered compound cream for the foot to include NSAID and gabapentin. He wishes to hold off on oral medication.  -Paperwork completed for diabetic shoe pre-certification.  -Discussed daily foot inspection. If there are any changes, to call the office immediately.  -Follow-up in 3 months or sooner if any problems are to arise. In the  meantime, encouraged to call the office with any questions, concerns, changes symptoms.  Celesta Gentile, DPM

## 2016-12-20 DIAGNOSIS — R69 Illness, unspecified: Secondary | ICD-10-CM | POA: Diagnosis not present

## 2017-01-03 DIAGNOSIS — R69 Illness, unspecified: Secondary | ICD-10-CM | POA: Diagnosis not present

## 2017-01-10 DIAGNOSIS — L638 Other alopecia areata: Secondary | ICD-10-CM | POA: Diagnosis not present

## 2017-01-10 DIAGNOSIS — C44311 Basal cell carcinoma of skin of nose: Secondary | ICD-10-CM | POA: Diagnosis not present

## 2017-01-16 ENCOUNTER — Ambulatory Visit: Payer: Medicare HMO | Admitting: *Deleted

## 2017-01-16 DIAGNOSIS — E1149 Type 2 diabetes mellitus with other diabetic neurological complication: Secondary | ICD-10-CM

## 2017-01-16 NOTE — Progress Notes (Signed)
Patient ID: Kevin Suarez, male   DOB: 11/03/1941, 76 y.o.   MRN: [id number]   Patient presents to be measured and molded for diabetic shoes and inserts.  Ordering New Balance [id number] Black velcro 11.5 2E

## 2017-01-24 DIAGNOSIS — Z6835 Body mass index (BMI) 35.0-35.9, adult: Secondary | ICD-10-CM | POA: Diagnosis not present

## 2017-01-24 DIAGNOSIS — I10 Essential (primary) hypertension: Secondary | ICD-10-CM | POA: Diagnosis not present

## 2017-01-24 DIAGNOSIS — R0781 Pleurodynia: Secondary | ICD-10-CM | POA: Diagnosis not present

## 2017-02-05 DIAGNOSIS — R69 Illness, unspecified: Secondary | ICD-10-CM | POA: Diagnosis not present

## 2017-02-13 DIAGNOSIS — N401 Enlarged prostate with lower urinary tract symptoms: Secondary | ICD-10-CM | POA: Diagnosis not present

## 2017-02-19 ENCOUNTER — Ambulatory Visit (INDEPENDENT_AMBULATORY_CARE_PROVIDER_SITE_OTHER): Payer: Medicare HMO | Admitting: Podiatry

## 2017-02-19 DIAGNOSIS — E1149 Type 2 diabetes mellitus with other diabetic neurological complication: Secondary | ICD-10-CM | POA: Diagnosis not present

## 2017-02-19 DIAGNOSIS — M79675 Pain in left toe(s): Secondary | ICD-10-CM | POA: Diagnosis not present

## 2017-02-19 DIAGNOSIS — Q828 Other specified congenital malformations of skin: Secondary | ICD-10-CM | POA: Diagnosis not present

## 2017-02-19 DIAGNOSIS — M79674 Pain in right toe(s): Secondary | ICD-10-CM | POA: Diagnosis not present

## 2017-02-20 DIAGNOSIS — N401 Enlarged prostate with lower urinary tract symptoms: Secondary | ICD-10-CM | POA: Diagnosis not present

## 2017-02-20 DIAGNOSIS — R972 Elevated prostate specific antigen [PSA]: Secondary | ICD-10-CM | POA: Diagnosis not present

## 2017-02-20 DIAGNOSIS — R351 Nocturia: Secondary | ICD-10-CM | POA: Diagnosis not present

## 2017-02-21 DIAGNOSIS — Z85828 Personal history of other malignant neoplasm of skin: Secondary | ICD-10-CM | POA: Diagnosis not present

## 2017-02-21 DIAGNOSIS — L720 Epidermal cyst: Secondary | ICD-10-CM | POA: Diagnosis not present

## 2017-02-21 DIAGNOSIS — D485 Neoplasm of uncertain behavior of skin: Secondary | ICD-10-CM | POA: Diagnosis not present

## 2017-02-21 DIAGNOSIS — C44311 Basal cell carcinoma of skin of nose: Secondary | ICD-10-CM | POA: Diagnosis not present

## 2017-02-26 NOTE — Progress Notes (Unsigned)
Patient picked up diabetic shoes and custom foot orthotics.   F/O fit was assessed for total contact conformity as well as relieving any pressure points.   Shoes were assessed for proper fit and function..There wasn't any concerns in regard to slippage in the rear counter.Marland KitchenMarland KitchenThere was at least 1/8" clearance in the toe box.\  Doctor Jacqualyn Posey to drop codes A5500 x 2 and A5513 x 6

## 2017-03-15 ENCOUNTER — Ambulatory Visit (INDEPENDENT_AMBULATORY_CARE_PROVIDER_SITE_OTHER): Payer: Medicare HMO | Admitting: Podiatry

## 2017-03-15 DIAGNOSIS — B351 Tinea unguium: Secondary | ICD-10-CM | POA: Diagnosis not present

## 2017-03-15 DIAGNOSIS — E1149 Type 2 diabetes mellitus with other diabetic neurological complication: Secondary | ICD-10-CM | POA: Diagnosis not present

## 2017-03-15 DIAGNOSIS — M79674 Pain in right toe(s): Secondary | ICD-10-CM

## 2017-03-15 DIAGNOSIS — Q828 Other specified congenital malformations of skin: Secondary | ICD-10-CM | POA: Diagnosis not present

## 2017-03-15 DIAGNOSIS — M79675 Pain in left toe(s): Secondary | ICD-10-CM

## 2017-03-16 NOTE — Progress Notes (Signed)
Subjective: 76 y.o. returns the office today for painful, elongated, thickened toenails which they cannot trim themself. Denies any redness or drainage around the nails. He also states he has calluses to both of his feet which cause pain at times. Denies any acute changes since last appointment and no new complaints today. Denies any systemic complaints such as fevers, chills, nausea, vomiting.   Objective: AAO 3, NAD DP/PT pulses palpable, CRT less than 3 seconds Protective sensation mildly decreased with Simms Weinstein monofilament Nails hypertrophic, dystrophic, elongated, brittle, discolored 10. There is tenderness overlying the nails 1-5 bilaterally. There is no surrounding erythema or drainage along the nail sites. Hyperkeatotic lesions on the plantar aspect of the right midfoot and heel. Upon debridement no underlying ulceration, drainage, signs of infection.  Mild hammertoes present.  No open lesions or pre-ulcerative lesions are identified. No other areas of tenderness bilateral lower extremities. No overlying edema, erythema, increased warmth. No pain with calf compression, swelling, warmth, erythema.  Assessment: Patient presents with symptomatic onychomycosis; hyperkeratotic lesions x2; type II diabetes mellitus with neuropathy   Plan: -Treatment options including alternatives, risks, complications were discussed -Nails sharply debrided 10 without complication/bleeding. -Hyperkeratotic lesions sharply debrided x 2 without complications or bleeding.  -Diabetic shoes are doing well.  -Discussed daily foot inspection. If there are any changes, to call the office immediately.  -Follow-up in 3 months or sooner if any problems are to arise. In the meantime, encouraged to call the office with any questions, concerns, changes symptoms.  Celesta Gentile, DPM

## 2017-03-23 DIAGNOSIS — H40012 Open angle with borderline findings, low risk, left eye: Secondary | ICD-10-CM | POA: Diagnosis not present

## 2017-03-23 DIAGNOSIS — H401212 Low-tension glaucoma, right eye, moderate stage: Secondary | ICD-10-CM | POA: Diagnosis not present

## 2017-06-01 DIAGNOSIS — E784 Other hyperlipidemia: Secondary | ICD-10-CM | POA: Diagnosis not present

## 2017-06-01 DIAGNOSIS — R808 Other proteinuria: Secondary | ICD-10-CM | POA: Diagnosis not present

## 2017-06-01 DIAGNOSIS — E1129 Type 2 diabetes mellitus with other diabetic kidney complication: Secondary | ICD-10-CM | POA: Diagnosis not present

## 2017-06-01 DIAGNOSIS — E538 Deficiency of other specified B group vitamins: Secondary | ICD-10-CM | POA: Diagnosis not present

## 2017-06-01 DIAGNOSIS — K219 Gastro-esophageal reflux disease without esophagitis: Secondary | ICD-10-CM | POA: Diagnosis not present

## 2017-06-01 DIAGNOSIS — E1140 Type 2 diabetes mellitus with diabetic neuropathy, unspecified: Secondary | ICD-10-CM | POA: Diagnosis not present

## 2017-06-01 DIAGNOSIS — N182 Chronic kidney disease, stage 2 (mild): Secondary | ICD-10-CM | POA: Diagnosis not present

## 2017-06-01 DIAGNOSIS — N401 Enlarged prostate with lower urinary tract symptoms: Secondary | ICD-10-CM | POA: Diagnosis not present

## 2017-06-01 DIAGNOSIS — L84 Corns and callosities: Secondary | ICD-10-CM | POA: Diagnosis not present

## 2017-06-01 DIAGNOSIS — I10 Essential (primary) hypertension: Secondary | ICD-10-CM | POA: Diagnosis not present

## 2017-06-12 DIAGNOSIS — L570 Actinic keratosis: Secondary | ICD-10-CM | POA: Diagnosis not present

## 2017-06-14 ENCOUNTER — Ambulatory Visit: Payer: Medicare HMO | Admitting: Podiatry

## 2017-06-20 DIAGNOSIS — R69 Illness, unspecified: Secondary | ICD-10-CM | POA: Diagnosis not present

## 2017-06-22 ENCOUNTER — Ambulatory Visit: Payer: Medicare HMO | Admitting: Podiatry

## 2017-06-28 DIAGNOSIS — Z794 Long term (current) use of insulin: Secondary | ICD-10-CM | POA: Diagnosis not present

## 2017-06-28 DIAGNOSIS — Z0189 Encounter for other specified special examinations: Secondary | ICD-10-CM | POA: Diagnosis not present

## 2017-06-28 DIAGNOSIS — R0789 Other chest pain: Secondary | ICD-10-CM | POA: Diagnosis not present

## 2017-06-28 DIAGNOSIS — E119 Type 2 diabetes mellitus without complications: Secondary | ICD-10-CM | POA: Diagnosis not present

## 2017-07-06 DIAGNOSIS — R5383 Other fatigue: Secondary | ICD-10-CM | POA: Diagnosis not present

## 2017-07-06 DIAGNOSIS — R0789 Other chest pain: Secondary | ICD-10-CM | POA: Diagnosis not present

## 2017-07-06 DIAGNOSIS — R0609 Other forms of dyspnea: Secondary | ICD-10-CM | POA: Diagnosis not present

## 2017-07-10 DIAGNOSIS — R69 Illness, unspecified: Secondary | ICD-10-CM | POA: Diagnosis not present

## 2017-07-25 DIAGNOSIS — R0609 Other forms of dyspnea: Secondary | ICD-10-CM | POA: Diagnosis not present

## 2017-07-25 DIAGNOSIS — R079 Chest pain, unspecified: Secondary | ICD-10-CM | POA: Diagnosis not present

## 2017-07-25 DIAGNOSIS — R5383 Other fatigue: Secondary | ICD-10-CM | POA: Diagnosis not present

## 2017-08-03 DIAGNOSIS — R0609 Other forms of dyspnea: Secondary | ICD-10-CM | POA: Diagnosis not present

## 2017-08-03 DIAGNOSIS — R5381 Other malaise: Secondary | ICD-10-CM | POA: Diagnosis not present

## 2017-08-03 DIAGNOSIS — R0789 Other chest pain: Secondary | ICD-10-CM | POA: Diagnosis not present

## 2017-08-03 DIAGNOSIS — R5383 Other fatigue: Secondary | ICD-10-CM | POA: Diagnosis not present

## 2017-08-04 DIAGNOSIS — R69 Illness, unspecified: Secondary | ICD-10-CM | POA: Diagnosis not present

## 2017-08-21 DIAGNOSIS — R972 Elevated prostate specific antigen [PSA]: Secondary | ICD-10-CM | POA: Diagnosis not present

## 2017-08-25 DIAGNOSIS — Z23 Encounter for immunization: Secondary | ICD-10-CM | POA: Diagnosis not present

## 2017-09-04 DIAGNOSIS — R69 Illness, unspecified: Secondary | ICD-10-CM | POA: Diagnosis not present

## 2017-09-25 DIAGNOSIS — E119 Type 2 diabetes mellitus without complications: Secondary | ICD-10-CM | POA: Diagnosis not present

## 2017-09-25 DIAGNOSIS — H40012 Open angle with borderline findings, low risk, left eye: Secondary | ICD-10-CM | POA: Diagnosis not present

## 2017-09-25 DIAGNOSIS — H35363 Drusen (degenerative) of macula, bilateral: Secondary | ICD-10-CM | POA: Diagnosis not present

## 2017-09-25 DIAGNOSIS — H401212 Low-tension glaucoma, right eye, moderate stage: Secondary | ICD-10-CM | POA: Diagnosis not present

## 2017-09-30 DIAGNOSIS — R69 Illness, unspecified: Secondary | ICD-10-CM | POA: Diagnosis not present

## 2017-10-28 DIAGNOSIS — R69 Illness, unspecified: Secondary | ICD-10-CM | POA: Diagnosis not present

## 2017-12-05 DIAGNOSIS — R82998 Other abnormal findings in urine: Secondary | ICD-10-CM | POA: Diagnosis not present

## 2017-12-05 DIAGNOSIS — E7849 Other hyperlipidemia: Secondary | ICD-10-CM | POA: Diagnosis not present

## 2017-12-05 DIAGNOSIS — I10 Essential (primary) hypertension: Secondary | ICD-10-CM | POA: Diagnosis not present

## 2017-12-05 DIAGNOSIS — E1129 Type 2 diabetes mellitus with other diabetic kidney complication: Secondary | ICD-10-CM | POA: Diagnosis not present

## 2017-12-05 DIAGNOSIS — E538 Deficiency of other specified B group vitamins: Secondary | ICD-10-CM | POA: Diagnosis not present

## 2017-12-05 DIAGNOSIS — Z125 Encounter for screening for malignant neoplasm of prostate: Secondary | ICD-10-CM | POA: Diagnosis not present

## 2017-12-12 DIAGNOSIS — I10 Essential (primary) hypertension: Secondary | ICD-10-CM | POA: Diagnosis not present

## 2017-12-12 DIAGNOSIS — E538 Deficiency of other specified B group vitamins: Secondary | ICD-10-CM | POA: Diagnosis not present

## 2017-12-12 DIAGNOSIS — R972 Elevated prostate specific antigen [PSA]: Secondary | ICD-10-CM | POA: Diagnosis not present

## 2017-12-12 DIAGNOSIS — N182 Chronic kidney disease, stage 2 (mild): Secondary | ICD-10-CM | POA: Diagnosis not present

## 2017-12-12 DIAGNOSIS — E7849 Other hyperlipidemia: Secondary | ICD-10-CM | POA: Diagnosis not present

## 2017-12-12 DIAGNOSIS — Z0000 Encounter for general adult medical examination without abnormal findings: Secondary | ICD-10-CM | POA: Diagnosis not present

## 2017-12-12 DIAGNOSIS — H9193 Unspecified hearing loss, bilateral: Secondary | ICD-10-CM | POA: Diagnosis not present

## 2017-12-12 DIAGNOSIS — E1129 Type 2 diabetes mellitus with other diabetic kidney complication: Secondary | ICD-10-CM | POA: Diagnosis not present

## 2017-12-12 DIAGNOSIS — E1140 Type 2 diabetes mellitus with diabetic neuropathy, unspecified: Secondary | ICD-10-CM | POA: Diagnosis not present

## 2017-12-12 DIAGNOSIS — L84 Corns and callosities: Secondary | ICD-10-CM | POA: Diagnosis not present

## 2017-12-17 DIAGNOSIS — M545 Low back pain: Secondary | ICD-10-CM | POA: Diagnosis not present

## 2017-12-17 DIAGNOSIS — Z1212 Encounter for screening for malignant neoplasm of rectum: Secondary | ICD-10-CM | POA: Diagnosis not present

## 2017-12-17 DIAGNOSIS — M25551 Pain in right hip: Secondary | ICD-10-CM | POA: Diagnosis not present

## 2018-01-02 DIAGNOSIS — M545 Low back pain: Secondary | ICD-10-CM | POA: Diagnosis not present

## 2018-02-04 DIAGNOSIS — M545 Low back pain: Secondary | ICD-10-CM | POA: Diagnosis not present

## 2018-02-08 DIAGNOSIS — M545 Low back pain: Secondary | ICD-10-CM | POA: Diagnosis not present

## 2018-02-19 DIAGNOSIS — M545 Low back pain: Secondary | ICD-10-CM | POA: Diagnosis not present

## 2018-03-15 DIAGNOSIS — M5416 Radiculopathy, lumbar region: Secondary | ICD-10-CM | POA: Diagnosis not present

## 2018-03-15 DIAGNOSIS — M48061 Spinal stenosis, lumbar region without neurogenic claudication: Secondary | ICD-10-CM | POA: Diagnosis not present

## 2018-03-15 DIAGNOSIS — M545 Low back pain: Secondary | ICD-10-CM | POA: Diagnosis not present

## 2018-04-05 DIAGNOSIS — H00025 Hordeolum internum left lower eyelid: Secondary | ICD-10-CM | POA: Diagnosis not present

## 2018-04-05 DIAGNOSIS — H401212 Low-tension glaucoma, right eye, moderate stage: Secondary | ICD-10-CM | POA: Diagnosis not present

## 2018-04-05 DIAGNOSIS — H40012 Open angle with borderline findings, low risk, left eye: Secondary | ICD-10-CM | POA: Diagnosis not present

## 2018-04-11 DIAGNOSIS — M545 Low back pain: Secondary | ICD-10-CM | POA: Diagnosis not present

## 2018-04-11 DIAGNOSIS — M48061 Spinal stenosis, lumbar region without neurogenic claudication: Secondary | ICD-10-CM | POA: Diagnosis not present

## 2018-04-11 DIAGNOSIS — M5416 Radiculopathy, lumbar region: Secondary | ICD-10-CM | POA: Diagnosis not present

## 2018-05-13 DIAGNOSIS — M48061 Spinal stenosis, lumbar region without neurogenic claudication: Secondary | ICD-10-CM | POA: Diagnosis not present

## 2018-05-23 DIAGNOSIS — M545 Low back pain: Secondary | ICD-10-CM | POA: Diagnosis not present

## 2018-05-23 DIAGNOSIS — M48061 Spinal stenosis, lumbar region without neurogenic claudication: Secondary | ICD-10-CM | POA: Diagnosis not present

## 2018-05-23 DIAGNOSIS — M5416 Radiculopathy, lumbar region: Secondary | ICD-10-CM | POA: Diagnosis not present

## 2018-06-13 DIAGNOSIS — M48061 Spinal stenosis, lumbar region without neurogenic claudication: Secondary | ICD-10-CM | POA: Diagnosis not present

## 2018-06-13 DIAGNOSIS — M545 Low back pain: Secondary | ICD-10-CM | POA: Diagnosis not present

## 2018-06-13 DIAGNOSIS — M5416 Radiculopathy, lumbar region: Secondary | ICD-10-CM | POA: Diagnosis not present

## 2018-06-22 DIAGNOSIS — Z7982 Long term (current) use of aspirin: Secondary | ICD-10-CM | POA: Diagnosis not present

## 2018-06-22 DIAGNOSIS — N400 Enlarged prostate without lower urinary tract symptoms: Secondary | ICD-10-CM | POA: Diagnosis not present

## 2018-06-22 DIAGNOSIS — M1990 Unspecified osteoarthritis, unspecified site: Secondary | ICD-10-CM | POA: Diagnosis not present

## 2018-06-22 DIAGNOSIS — G8929 Other chronic pain: Secondary | ICD-10-CM | POA: Diagnosis not present

## 2018-06-22 DIAGNOSIS — Z791 Long term (current) use of non-steroidal anti-inflammatories (NSAID): Secondary | ICD-10-CM | POA: Diagnosis not present

## 2018-06-22 DIAGNOSIS — Z7984 Long term (current) use of oral hypoglycemic drugs: Secondary | ICD-10-CM | POA: Diagnosis not present

## 2018-06-22 DIAGNOSIS — Z803 Family history of malignant neoplasm of breast: Secondary | ICD-10-CM | POA: Diagnosis not present

## 2018-06-22 DIAGNOSIS — E1140 Type 2 diabetes mellitus with diabetic neuropathy, unspecified: Secondary | ICD-10-CM | POA: Diagnosis not present

## 2018-06-22 DIAGNOSIS — Z6837 Body mass index (BMI) 37.0-37.9, adult: Secondary | ICD-10-CM | POA: Diagnosis not present

## 2018-07-01 DIAGNOSIS — M545 Low back pain: Secondary | ICD-10-CM | POA: Diagnosis not present

## 2018-07-01 DIAGNOSIS — M48061 Spinal stenosis, lumbar region without neurogenic claudication: Secondary | ICD-10-CM | POA: Diagnosis not present

## 2018-07-01 DIAGNOSIS — M5136 Other intervertebral disc degeneration, lumbar region: Secondary | ICD-10-CM | POA: Diagnosis not present

## 2018-07-01 DIAGNOSIS — M5416 Radiculopathy, lumbar region: Secondary | ICD-10-CM | POA: Diagnosis not present

## 2018-07-12 DIAGNOSIS — M5416 Radiculopathy, lumbar region: Secondary | ICD-10-CM | POA: Diagnosis not present

## 2018-07-12 DIAGNOSIS — M48061 Spinal stenosis, lumbar region without neurogenic claudication: Secondary | ICD-10-CM | POA: Diagnosis not present

## 2018-07-12 DIAGNOSIS — M5136 Other intervertebral disc degeneration, lumbar region: Secondary | ICD-10-CM | POA: Diagnosis not present

## 2018-07-12 DIAGNOSIS — M545 Low back pain: Secondary | ICD-10-CM | POA: Diagnosis not present

## 2018-08-09 DIAGNOSIS — R69 Illness, unspecified: Secondary | ICD-10-CM | POA: Diagnosis not present

## 2018-08-10 DIAGNOSIS — Z23 Encounter for immunization: Secondary | ICD-10-CM | POA: Diagnosis not present

## 2018-08-13 DIAGNOSIS — M48061 Spinal stenosis, lumbar region without neurogenic claudication: Secondary | ICD-10-CM | POA: Diagnosis not present

## 2018-08-13 DIAGNOSIS — M5136 Other intervertebral disc degeneration, lumbar region: Secondary | ICD-10-CM | POA: Diagnosis not present

## 2018-08-13 DIAGNOSIS — M545 Low back pain: Secondary | ICD-10-CM | POA: Diagnosis not present

## 2018-09-04 DIAGNOSIS — R69 Illness, unspecified: Secondary | ICD-10-CM | POA: Diagnosis not present

## 2018-09-27 DIAGNOSIS — E119 Type 2 diabetes mellitus without complications: Secondary | ICD-10-CM | POA: Diagnosis not present

## 2018-09-27 DIAGNOSIS — H40012 Open angle with borderline findings, low risk, left eye: Secondary | ICD-10-CM | POA: Diagnosis not present

## 2018-09-27 DIAGNOSIS — H401212 Low-tension glaucoma, right eye, moderate stage: Secondary | ICD-10-CM | POA: Diagnosis not present

## 2018-09-27 DIAGNOSIS — H35363 Drusen (degenerative) of macula, bilateral: Secondary | ICD-10-CM | POA: Diagnosis not present

## 2018-10-07 DIAGNOSIS — R69 Illness, unspecified: Secondary | ICD-10-CM | POA: Diagnosis not present

## 2018-10-23 DIAGNOSIS — R3914 Feeling of incomplete bladder emptying: Secondary | ICD-10-CM | POA: Diagnosis not present

## 2018-10-23 DIAGNOSIS — R972 Elevated prostate specific antigen [PSA]: Secondary | ICD-10-CM | POA: Diagnosis not present

## 2018-10-23 DIAGNOSIS — N401 Enlarged prostate with lower urinary tract symptoms: Secondary | ICD-10-CM | POA: Diagnosis not present

## 2018-10-23 DIAGNOSIS — N5201 Erectile dysfunction due to arterial insufficiency: Secondary | ICD-10-CM | POA: Diagnosis not present

## 2018-11-05 DIAGNOSIS — E668 Other obesity: Secondary | ICD-10-CM | POA: Diagnosis not present

## 2018-11-05 DIAGNOSIS — I951 Orthostatic hypotension: Secondary | ICD-10-CM | POA: Diagnosis not present

## 2018-11-05 DIAGNOSIS — N401 Enlarged prostate with lower urinary tract symptoms: Secondary | ICD-10-CM | POA: Diagnosis not present

## 2018-11-05 DIAGNOSIS — I10 Essential (primary) hypertension: Secondary | ICD-10-CM | POA: Diagnosis not present

## 2018-11-05 DIAGNOSIS — R42 Dizziness and giddiness: Secondary | ICD-10-CM | POA: Diagnosis not present

## 2018-11-05 DIAGNOSIS — N182 Chronic kidney disease, stage 2 (mild): Secondary | ICD-10-CM | POA: Diagnosis not present

## 2018-11-05 DIAGNOSIS — Z6834 Body mass index (BMI) 34.0-34.9, adult: Secondary | ICD-10-CM | POA: Diagnosis not present

## 2018-11-05 DIAGNOSIS — E1129 Type 2 diabetes mellitus with other diabetic kidney complication: Secondary | ICD-10-CM | POA: Diagnosis not present

## 2018-11-13 DIAGNOSIS — H9190 Unspecified hearing loss, unspecified ear: Secondary | ICD-10-CM

## 2018-11-13 HISTORY — DX: Unspecified hearing loss, unspecified ear: H91.90

## 2018-12-26 DIAGNOSIS — E1129 Type 2 diabetes mellitus with other diabetic kidney complication: Secondary | ICD-10-CM | POA: Diagnosis not present

## 2018-12-26 DIAGNOSIS — Z125 Encounter for screening for malignant neoplasm of prostate: Secondary | ICD-10-CM | POA: Diagnosis not present

## 2018-12-26 DIAGNOSIS — R82998 Other abnormal findings in urine: Secondary | ICD-10-CM | POA: Diagnosis not present

## 2018-12-26 DIAGNOSIS — E7849 Other hyperlipidemia: Secondary | ICD-10-CM | POA: Diagnosis not present

## 2018-12-26 DIAGNOSIS — I10 Essential (primary) hypertension: Secondary | ICD-10-CM | POA: Diagnosis not present

## 2018-12-26 DIAGNOSIS — E538 Deficiency of other specified B group vitamins: Secondary | ICD-10-CM | POA: Diagnosis not present

## 2019-01-02 DIAGNOSIS — N182 Chronic kidney disease, stage 2 (mild): Secondary | ICD-10-CM | POA: Diagnosis not present

## 2019-01-02 DIAGNOSIS — E1129 Type 2 diabetes mellitus with other diabetic kidney complication: Secondary | ICD-10-CM | POA: Diagnosis not present

## 2019-01-02 DIAGNOSIS — E668 Other obesity: Secondary | ICD-10-CM | POA: Diagnosis not present

## 2019-01-02 DIAGNOSIS — Z0000 Encounter for general adult medical examination without abnormal findings: Secondary | ICD-10-CM | POA: Diagnosis not present

## 2019-01-02 DIAGNOSIS — N401 Enlarged prostate with lower urinary tract symptoms: Secondary | ICD-10-CM | POA: Diagnosis not present

## 2019-01-02 DIAGNOSIS — E1140 Type 2 diabetes mellitus with diabetic neuropathy, unspecified: Secondary | ICD-10-CM | POA: Diagnosis not present

## 2019-01-02 DIAGNOSIS — E7849 Other hyperlipidemia: Secondary | ICD-10-CM | POA: Diagnosis not present

## 2019-01-02 DIAGNOSIS — R808 Other proteinuria: Secondary | ICD-10-CM | POA: Diagnosis not present

## 2019-01-02 DIAGNOSIS — I10 Essential (primary) hypertension: Secondary | ICD-10-CM | POA: Diagnosis not present

## 2019-01-02 DIAGNOSIS — R972 Elevated prostate specific antigen [PSA]: Secondary | ICD-10-CM | POA: Diagnosis not present

## 2019-01-03 DIAGNOSIS — Z1212 Encounter for screening for malignant neoplasm of rectum: Secondary | ICD-10-CM | POA: Diagnosis not present

## 2019-01-13 DIAGNOSIS — R69 Illness, unspecified: Secondary | ICD-10-CM | POA: Diagnosis not present

## 2019-02-25 DIAGNOSIS — N401 Enlarged prostate with lower urinary tract symptoms: Secondary | ICD-10-CM | POA: Diagnosis not present

## 2019-02-25 DIAGNOSIS — K4090 Unilateral inguinal hernia, without obstruction or gangrene, not specified as recurrent: Secondary | ICD-10-CM | POA: Diagnosis not present

## 2019-02-25 DIAGNOSIS — R3914 Feeling of incomplete bladder emptying: Secondary | ICD-10-CM | POA: Diagnosis not present

## 2019-02-25 DIAGNOSIS — N5201 Erectile dysfunction due to arterial insufficiency: Secondary | ICD-10-CM | POA: Diagnosis not present

## 2019-03-07 DIAGNOSIS — N401 Enlarged prostate with lower urinary tract symptoms: Secondary | ICD-10-CM | POA: Diagnosis not present

## 2019-03-07 DIAGNOSIS — R972 Elevated prostate specific antigen [PSA]: Secondary | ICD-10-CM | POA: Diagnosis not present

## 2019-03-07 DIAGNOSIS — R3914 Feeling of incomplete bladder emptying: Secondary | ICD-10-CM | POA: Diagnosis not present

## 2019-03-07 DIAGNOSIS — R300 Dysuria: Secondary | ICD-10-CM | POA: Diagnosis not present

## 2019-03-07 DIAGNOSIS — N200 Calculus of kidney: Secondary | ICD-10-CM | POA: Diagnosis not present

## 2019-03-21 DIAGNOSIS — R3912 Poor urinary stream: Secondary | ICD-10-CM | POA: Diagnosis not present

## 2019-03-21 DIAGNOSIS — R300 Dysuria: Secondary | ICD-10-CM | POA: Diagnosis not present

## 2019-03-21 DIAGNOSIS — N401 Enlarged prostate with lower urinary tract symptoms: Secondary | ICD-10-CM | POA: Diagnosis not present

## 2019-03-21 DIAGNOSIS — R350 Frequency of micturition: Secondary | ICD-10-CM | POA: Diagnosis not present

## 2019-03-27 DIAGNOSIS — M545 Low back pain: Secondary | ICD-10-CM | POA: Diagnosis not present

## 2019-03-27 DIAGNOSIS — M5136 Other intervertebral disc degeneration, lumbar region: Secondary | ICD-10-CM | POA: Diagnosis not present

## 2019-03-27 DIAGNOSIS — M48061 Spinal stenosis, lumbar region without neurogenic claudication: Secondary | ICD-10-CM | POA: Diagnosis not present

## 2019-03-28 DIAGNOSIS — K4090 Unilateral inguinal hernia, without obstruction or gangrene, not specified as recurrent: Secondary | ICD-10-CM | POA: Diagnosis not present

## 2019-04-09 DIAGNOSIS — R69 Illness, unspecified: Secondary | ICD-10-CM | POA: Diagnosis not present

## 2019-04-17 DIAGNOSIS — R3912 Poor urinary stream: Secondary | ICD-10-CM | POA: Diagnosis not present

## 2019-04-17 DIAGNOSIS — R350 Frequency of micturition: Secondary | ICD-10-CM | POA: Diagnosis not present

## 2019-04-17 DIAGNOSIS — R3914 Feeling of incomplete bladder emptying: Secondary | ICD-10-CM | POA: Diagnosis not present

## 2019-04-23 DIAGNOSIS — R972 Elevated prostate specific antigen [PSA]: Secondary | ICD-10-CM | POA: Diagnosis not present

## 2019-05-08 DIAGNOSIS — R3914 Feeling of incomplete bladder emptying: Secondary | ICD-10-CM | POA: Diagnosis not present

## 2019-05-08 DIAGNOSIS — R972 Elevated prostate specific antigen [PSA]: Secondary | ICD-10-CM | POA: Diagnosis not present

## 2019-05-08 DIAGNOSIS — N401 Enlarged prostate with lower urinary tract symptoms: Secondary | ICD-10-CM | POA: Diagnosis not present

## 2019-06-17 DIAGNOSIS — E538 Deficiency of other specified B group vitamins: Secondary | ICD-10-CM | POA: Diagnosis not present

## 2019-06-17 DIAGNOSIS — N401 Enlarged prostate with lower urinary tract symptoms: Secondary | ICD-10-CM | POA: Diagnosis not present

## 2019-06-17 DIAGNOSIS — M179 Osteoarthritis of knee, unspecified: Secondary | ICD-10-CM | POA: Diagnosis not present

## 2019-06-17 DIAGNOSIS — R972 Elevated prostate specific antigen [PSA]: Secondary | ICD-10-CM | POA: Diagnosis not present

## 2019-06-17 DIAGNOSIS — E785 Hyperlipidemia, unspecified: Secondary | ICD-10-CM | POA: Diagnosis not present

## 2019-06-17 DIAGNOSIS — R809 Proteinuria, unspecified: Secondary | ICD-10-CM | POA: Diagnosis not present

## 2019-06-17 DIAGNOSIS — E1140 Type 2 diabetes mellitus with diabetic neuropathy, unspecified: Secondary | ICD-10-CM | POA: Diagnosis not present

## 2019-06-17 DIAGNOSIS — E1129 Type 2 diabetes mellitus with other diabetic kidney complication: Secondary | ICD-10-CM | POA: Diagnosis not present

## 2019-06-17 DIAGNOSIS — N182 Chronic kidney disease, stage 2 (mild): Secondary | ICD-10-CM | POA: Diagnosis not present

## 2019-06-17 DIAGNOSIS — E669 Obesity, unspecified: Secondary | ICD-10-CM | POA: Diagnosis not present

## 2019-07-07 DIAGNOSIS — R69 Illness, unspecified: Secondary | ICD-10-CM | POA: Diagnosis not present

## 2019-07-25 DIAGNOSIS — E1129 Type 2 diabetes mellitus with other diabetic kidney complication: Secondary | ICD-10-CM | POA: Diagnosis not present

## 2019-07-25 DIAGNOSIS — M25511 Pain in right shoulder: Secondary | ICD-10-CM | POA: Diagnosis not present

## 2019-08-01 ENCOUNTER — Ambulatory Visit
Admission: RE | Admit: 2019-08-01 | Discharge: 2019-08-01 | Disposition: A | Discharge status: Home / Self Care | Payer: Medicare HMO | Source: Ambulatory Visit | Attending: Internal Medicine | Admitting: Internal Medicine

## 2019-08-01 ENCOUNTER — Other Ambulatory Visit: Payer: Self-pay | Admitting: Internal Medicine

## 2019-08-01 DIAGNOSIS — M25511 Pain in right shoulder: Secondary | ICD-10-CM | POA: Diagnosis not present

## 2019-08-02 ENCOUNTER — Other Ambulatory Visit: Payer: Self-pay

## 2019-08-02 ENCOUNTER — Encounter (HOSPITAL_COMMUNITY): Payer: Self-pay

## 2019-08-02 ENCOUNTER — Emergency Department (HOSPITAL_COMMUNITY): Payer: Medicare HMO

## 2019-08-02 ENCOUNTER — Emergency Department (HOSPITAL_COMMUNITY)
Admission: EM | Admit: 2019-08-02 | Discharge: 2019-08-02 | Disposition: A | Discharge status: Home / Self Care | Payer: Medicare HMO | Attending: Emergency Medicine | Admitting: Emergency Medicine

## 2019-08-02 DIAGNOSIS — R0789 Other chest pain: Secondary | ICD-10-CM | POA: Insufficient documentation

## 2019-08-02 DIAGNOSIS — I10 Essential (primary) hypertension: Secondary | ICD-10-CM | POA: Diagnosis not present

## 2019-08-02 DIAGNOSIS — M5412 Radiculopathy, cervical region: Secondary | ICD-10-CM

## 2019-08-02 DIAGNOSIS — Z7982 Long term (current) use of aspirin: Secondary | ICD-10-CM | POA: Diagnosis not present

## 2019-08-02 DIAGNOSIS — M25511 Pain in right shoulder: Secondary | ICD-10-CM

## 2019-08-02 DIAGNOSIS — Z7984 Long term (current) use of oral hypoglycemic drugs: Secondary | ICD-10-CM | POA: Insufficient documentation

## 2019-08-02 DIAGNOSIS — M542 Cervicalgia: Secondary | ICD-10-CM

## 2019-08-02 DIAGNOSIS — Z87891 Personal history of nicotine dependence: Secondary | ICD-10-CM | POA: Insufficient documentation

## 2019-08-02 DIAGNOSIS — E119 Type 2 diabetes mellitus without complications: Secondary | ICD-10-CM | POA: Diagnosis not present

## 2019-08-02 DIAGNOSIS — R079 Chest pain, unspecified: Secondary | ICD-10-CM | POA: Diagnosis not present

## 2019-08-02 DIAGNOSIS — R0602 Shortness of breath: Secondary | ICD-10-CM | POA: Diagnosis not present

## 2019-08-02 LAB — BASIC METABOLIC PANEL
Anion gap: 11 (ref 5–15)
BUN: 19 mg/dL (ref 8–23)
CO2: 21 mmol/L — ABNORMAL LOW (ref 22–32)
Calcium: 9.6 mg/dL (ref 8.9–10.3)
Chloride: 106 mmol/L (ref 98–111)
Creatinine, Ser: 1.21 mg/dL (ref 0.61–1.24)
GFR calc Af Amer: >60 mL/min (ref >60)
GFR calc non Af Amer: 57 mL/min — ABNORMAL LOW (ref >60)
Glucose, Bld: 177 mg/dL — ABNORMAL HIGH (ref 70–99)
Potassium: 4.7 mmol/L (ref 3.5–5.1)
Sodium: 138 mmol/L (ref 135–145)

## 2019-08-02 LAB — D-DIMER, QUANTITATIVE: D-Dimer, Quant: <0.27 ug/mL-FEU (ref 0.00–0.50)

## 2019-08-02 LAB — CBC
HCT: 37.8 % — ABNORMAL LOW (ref 39.0–52.0)
Hemoglobin: 12.8 g/dL — ABNORMAL LOW (ref 13.0–17.0)
MCH: 29.2 pg (ref 26.0–34.0)
MCHC: 33.9 g/dL (ref 30.0–36.0)
MCV: 86.1 fL (ref 80.0–100.0)
Platelets: 220 10*3/uL (ref 150–400)
RBC: 4.39 MIL/uL (ref 4.22–5.81)
RDW: 14.1 % (ref 11.5–15.5)
WBC: 10.1 10*3/uL (ref 4.0–10.5)
nRBC: 0 % (ref 0.0–0.2)

## 2019-08-02 LAB — TROPONIN I (HIGH SENSITIVITY)
Troponin I (High Sensitivity): 7 ng/L (ref <18)
Troponin I (High Sensitivity): 11 ng/L (ref <18)

## 2019-08-02 MED ORDER — HYDROCODONE-ACETAMINOPHEN 5-325 MG PO TABS: 1.0000 | ORAL_TABLET | Freq: Four times a day (QID) | ORAL | 0 refills | Status: DC | PRN

## 2019-08-02 MED ORDER — SODIUM CHLORIDE 0.9% FLUSH: 3.0000 mL | Freq: Once | INTRAVENOUS | Status: DC

## 2019-08-02 MED ORDER — LIDOCAINE 5 % EX PTCH
2.0000 | MEDICATED_PATCH | CUTANEOUS | Status: DC
  Administered 2019-08-02: 2 via TRANSDERMAL
  Filled 2019-08-02: qty 2

## 2019-08-02 NOTE — Discharge Instructions (Signed)
Work-up regarding heart and lungs has been reassuring today and I suspect this is likely musculoskeletal pain, could potentially be coming from a pinched or inflamed nerve in your neck.  Ultimately you will likely benefit from an outpatient MRI of your neck, please discuss this with your primary care doctor, I have also provided information for follow-up with a neurosurgeon and spine specialist, Dr. Venetia Constable, please call his office to schedule follow-up.  In the meantime you can continue to use over-the-counter lidocaine patches (salon pas), as well as prescribed pain medication.  Please use this medication with caution as it can cause drowsiness.  Return if you develop numbness or weakness in your right arm or any other new or concerning symptoms.

## 2019-08-02 NOTE — ED Provider Notes (Signed)
[facility]ERG[facility]Y DEPARTMENT Provider Note   CSN: [id number] Arrival date & time: 08/02/19  [id number]     History   Chief Complaint Chief Complaint  Patient presents with   Chest Pain   Shortness of Breath   Shoulder Pain    HPI Kevin Suarez is a 78 y.o. male.     Kevin Suarez is a 78 y.o. male with a history of hypertension, hyperlipidemia, diabetes, BPH, who presents to the emergency department for evaluation of chest pain.  Patient reports over the past 2 weeks he has had intermittent right-sided chest pains as well as pain in the right shoulder right upper arm and right neck.  Pains come and go and are worse with movement and palpation.  Patient also reports some associated shortness of breath and that when he takes a deep breath it makes the pain in his right chest shoulder and arm worse.  Pain is not worse with exertion.  He denies any diaphoresis chills or fever.  No productive cough.  No lightheadedness or syncope.  Patient was seen by PCP when pain initially started and treated with course of steroids and muscle relaxer without improvement.  Patient expresses concern for possible blood clot, although has no previous history, no recent long distance travel or surgeries.  Patient is not on any blood thinners.  He denies numbness tingling or weakness in the upper extremities.  Has not tried anything else for pain.  No other aggravating or alleviating factors.     Past Medical History:  Diagnosis Date   Bladder stone    BPH (benign prostatic hypertrophy)    Frequency of urination    H/O asbestos exposure    History of kidney stones    Hyperlipidemia    Hypertension    Lung nodules    LAST CT 2012  PER DR WRIGHT NOTE NO FURTHER WORK-UP (PULMOLOGIST)   Nephrolithiasis    bilateral   Type 2 diabetes mellitus (HCC)    Wears dentures    upper   Wears glasses    Wears partial dentures     Patient Active Problem List   Diagnosis Date  Noted   BPH (benign prostatic hyperplasia)    Anemia 10/19/2015   Lung nodules 10/11/2011   Hyperlipidemia    GERD (gastroesophageal reflux disease)    Obesity    BPH (benign prostatic hypertrophy)    Diabetes mellitus ([place])     Past Surgical History:  Procedure Laterality Date   CATARACT EXTRACTION W/ INTRAOCULAR LENS  IMPLANT, BILATERAL     CYSTOSCOPY WITH LITHOLAPAXY N/A 11/28/2013   Procedure: CYSTOSCOPY WITH stone retrieval;  Surgeon: Claybon Jabs, MD;  Location: [facility];  Service: Urology;  Laterality: N/A;   CYSTOSCOPY WITH LITHOLAPAXY N/A 10/15/2015   Procedure: CYSTOSCOPY WITH LITHOLAPAXY, HOLMIUM LASER LITHOTRIPSY tranurethral excision of prostate;  Surgeon: [name], MD;  Location: [facility];  Service: Urology;  Laterality: N/A;   EXTRACORPOREAL SHOCK WAVE LITHOTRIPSY Right 05-13-2015   HOLMIUM LASER APPLICATION N/A [id number]   Procedure: HOLMIUM LASER APPLICATION;  Surgeon: [name], MD;  Location: [facility];  Service: Urology;  Laterality: N/A;   TOTAL HIP ARTHROPLASTY Bilateral 2000  &  2005   TRANSURETHRAL RESECTION OF PROSTATE  11/28/2013   Procedure: TRANSURETHRAL RESECTION OF THE PROSTATE WITH  BUTTON GYRUS INSTRUMENTS;  Surgeon: Claybon Jabs, MD;  Location: [place];  Service: Urology;;   WRIST SURGERY Left 2005  INJURY        Home Medications    Prior to Admission medications   Medication Sig Start Date End Date Taking? Authorizing Provider  aspirin EC 81 MG tablet Take 81 mg by mouth daily.    [provider]  ciprofloxacin (CIPRO) 500 MG tablet Take 1 tablet (500 mg total) by mouth 2 (two) times daily. 10/23/15   Cherene Altes, MD  HYDROcodone-acetaminophen (NORCO) 10-325 MG tablet Take 1-2 tablets by mouth every 4 (four) hours as needed for moderate pain. Maximum dose per 24 hours - 8 pills 10/15/15   [name], MD  losartan (COZAAR) 50 MG  tablet Take 50 mg by mouth every morning.    [provider]  metFORMIN (GLUCOPHAGE) 500 MG tablet Take 500 mg by mouth 2 (two) times daily with a meal.     [provider]  Meth-Hyo-M Bl-Na Phos-Ph Sal (URIBEL) 118 MG CAPS Take 118 mg by mouth daily.    [provider]  NONFORMULARY OR COMPOUNDED Pace:  Achilles Tendonitis Cream - Diclofenac 3%, Doxepin 5%, Bupivacaine 1%, Gabapentin 6%, Ibuprofen 3%, Pentoxifylline 3%, apply 1-2 grams to both feet 3-4 times daily 12/15/16   Trula Slade, DPM  phenazopyridine (PYRIDIUM) 200 MG tablet Take 1 tablet (200 mg total) by mouth 3 (three) times daily as needed for pain. 10/15/15   [name], MD  tamsulosin (FLOMAX) 0.4 MG CAPS capsule Take 0.4 mg by mouth at bedtime. 09/14/15   [provider]    Family History Family History  Problem Relation Age of Onset   Cancer Mother        Unknown   Cancer Maternal Grandmother    Heart attack Paternal Grandfather    Coronary artery disease Father    Diabetes Father     Social History Social History   Tobacco Use   Smoking status: Former Smoker    Packs/day: 2.00    Years: 30.00    Pack years: 60.00    Types: Cigarettes    Quit date: 11/14/1995    Years since quitting: 23.7   Smokeless tobacco: Never Used  Substance Use Topics   Alcohol use: No   Drug use: No     Allergies   Prednisone   Review of Systems Review of Systems  Constitutional: Negative for chills, diaphoresis and fever.  HENT: Negative.   Respiratory: Positive for cough and shortness of breath.   Cardiovascular: Positive for chest pain. Negative for palpitations and leg swelling.  Gastrointestinal: Negative for abdominal pain, nausea and vomiting.  Musculoskeletal: Positive for arthralgias and neck pain.  Skin: Negative for color change and rash.  Neurological: Negative for dizziness, weakness, light-headedness and numbness.  All other systems reviewed and  are negative.    Physical Exam Updated Vital Signs BP 128/73    Pulse 72    Temp 98 F (36.7 C) (Oral)    Resp (!) 21    SpO2 100%   Physical Exam Vitals signs and nursing note reviewed.  Constitutional:      General: He is not in acute distress.    Appearance: He is well-developed. He is not ill-appearing or diaphoretic.  HENT:     Head: Normocephalic and atraumatic.  Eyes:     General:        Right eye: No discharge.        Left eye: No discharge.     Pupils: Pupils are equal, round, and reactive to light.  Neck:  Musculoskeletal: Neck supple.     Comments: Tenderness to palpation over the right paraspinal muscle muscles extending into the right trapezius Cardiovascular:     Rate and Rhythm: Normal rate and regular rhythm.     Pulses:          Radial pulses are 2+ on the right side and 2+ on the left side.       Dorsalis pedis pulses are 2+ on the right side and 2+ on the left side.     Heart sounds: Normal heart sounds. No murmur. No friction rub. No gallop.   Pulmonary:     Effort: Pulmonary effort is normal. No respiratory distress.     Breath sounds: Normal breath sounds. No wheezing or rales.     Comments: Respirations equal and unlabored, patient able to speak in full sentences, lungs clear to auscultation bilaterally Chest:     Chest wall: Tenderness present.     Comments: Tenderness extending from shoulder into the right upper chest wall, reproducible with palpation, no overlying skin changes Abdominal:     General: Bowel sounds are normal. There is no distension.     Palpations: Abdomen is soft. There is no mass.     Tenderness: There is no abdominal tenderness. There is no guarding.  Musculoskeletal:        General: No deformity.     Right lower leg: He exhibits no tenderness. No edema.     Left lower leg: He exhibits no tenderness. No edema.     Comments: Tenderness to palpation over the right shoulder extending into the right upper arm, no overlying skin  changes aside from one small blisterlike lesion just proximal to the olecranon with small amount of erythema, no induration or fluctuance.  No other vesicular lesions noted.  Skin:    General: Skin is warm and dry.     Capillary Refill: Capillary refill takes less than 2 seconds.  Neurological:     Mental Status: He is alert.     Coordination: Coordination normal.     Comments: Speech is clear, able to follow commands CN III-XII intact Normal strength in upper and lower extremities bilaterally including dorsiflexion and plantar flexion, strong and equal grip strength Sensation normal to light and sharp touch Moves extremities without ataxia, coordination intact   Psychiatric:        Mood and Affect: Mood normal.        Behavior: Behavior normal.      ED Treatments / Results  Labs (all labs ordered are listed, but only abnormal results are displayed) Labs Reviewed  BASIC METABOLIC PANEL - Abnormal; Notable for the following components:      Result Value   CO2 21 (*)    Glucose, Bld 177 (*)    GFR calc non Af Amer 57 (*)    All other components within normal limits  CBC - Abnormal; Notable for the following components:   Hemoglobin 12.8 (*)    HCT 37.8 (*)    All other components within normal limits  TROPONIN I (HIGH SENSITIVITY)  TROPONIN I (HIGH SENSITIVITY)    EKG EKG Interpretation  Date/Time:  Saturday August 02 2019 09:18:45 EDT Ventricular Rate:  96 PR Interval:  152 QRS Duration: 92 QT Interval:  346 QTC Calculation: 437 R Axis:   56 Text Interpretation:  Normal sinus rhythm Normal ECG Confirmed by Gerlene Fee [phone]) on 08/02/2019 12:05:49 PM   Radiology Dg Chest 2 View  Result Date: 08/02/2019 CLINICAL DATA:  Shortness of breath, chest pain EXAM: CHEST - 2 VIEW COMPARISON:  None. FINDINGS: The heart size and mediastinal contours are within normal limits. Both lungs are clear. Disc degenerative disease of the thoracic spine. IMPRESSION: No acute  abnormality of the lungs. Electronically Signed   By: Eddie Candle M.D.   On: 08/02/2019 10:39   Dg Shoulder Right  Result Date: 08/01/2019 CLINICAL DATA:  Right shoulder pain for the past 2 weeks. EXAM: RIGHT SHOULDER - 2+ VIEW COMPARISON:  None. FINDINGS: No acute fracture or dislocation. Minimal glenohumeral acromioclavicular spurring. The joint spaces are preserved. Soft tissues are unremarkable. IMPRESSION: 1.  No acute osseous abnormality.  Minimal degenerative changes. Electronically Signed   By: Titus Dubin M.D.   On: 08/01/2019 12:32    Procedures Procedures (including critical care time)  Medications Ordered in ED Medications  sodium chloride flush (NS) 0.9 % injection 3 mL (has no administration in time range)     Initial Impression / Assessment and Plan / ED Course  I have reviewed the triage vital signs and the nursing notes.  Pertinent labs & imaging results that were available during my care of the patient were reviewed by me and considered in my medical decision making (see chart for details).  78 year old male presents with 2 weeks of intermittent right-sided chest pain associated with right shoulder and neck pain.  Pain is nonexertional.  It is reproducible on exam with palpation.  Patient with tenderness over the right paraspinal muscles extending into the trapezius and the right chest wall.  I suspect that this pain may be related to a cervical radiculopathy, but patient does have cardiac risk factors, and given that patient does report some pleuritic pain would also consider PE.  Will get basic labs, troponin, EKG, chest x-ray and d-dimer.  Patient has not had improvement with muscle relaxers and steroids, will give pain medication and lidocaine patches here in the ED.  Lab evaluation reassuring, no leukocytosis, stable hemoglobin, no significant electrolyte derangements, normal renal function.  Initial troponin VII, 2-hour troponin only increased by 4 which does not  meet criteria for inpatient observation, especially given that chest pain is extremely atypical.  Chest x-ray with no active cardiopulmonary disease.  EKG without concerning changes.  D-dimer is negative.  Do not feel that a CT of the cervical spine would be beneficial at this time.  Patient is neurologically intact and does not meet criteria for emergent MRI, do think patient would likely benefit from an outpatient MRI.  I have encouraged patient and his wife to follow-up closely with her PCP and also provided referral to neurosurgery for further evaluation of cervical radiculopathy.  Will prescribe short course of pain medication, encourage patient to use over-the-counter lidocaine patches as well.  Discharged home in good condition.  Patient and wife expressed understanding and are in agreement with plan.  Patient discussed with Dr. Sedonia Small, who saw patient as well and agrees with plan.   Final Clinical Impressions(s) / ED Diagnoses   Final diagnoses:  Right-sided chest wall pain  Acute pain of right shoulder  Neck pain on right side  Cervical radiculopathy    ED Discharge Orders         Ordered    HYDROcodone-acetaminophen (NORCO) 5-325 MG tablet  Every 6 hours PRN     08/02/19 1350           Jacqlyn Larsen, [place] 08/03/19 2152    Maudie Flakes, MD 08/08/19 1540

## 2019-08-02 NOTE — ED Triage Notes (Signed)
Patient complains of intermittent CP x 2 weeks, also concerned with right upper arm pain for same, complains of pain with ROM. Reports SOB and prescribed steroids and muscle relaxer. Denies fever, NAD

## 2019-08-23 DIAGNOSIS — Z23 Encounter for immunization: Secondary | ICD-10-CM | POA: Diagnosis not present

## 2019-09-15 DIAGNOSIS — H833X3 Noise effects on inner ear, bilateral: Secondary | ICD-10-CM | POA: Diagnosis not present

## 2019-09-15 DIAGNOSIS — H903 Sensorineural hearing loss, bilateral: Secondary | ICD-10-CM | POA: Diagnosis not present

## 2019-09-15 DIAGNOSIS — H9113 Presbycusis, bilateral: Secondary | ICD-10-CM | POA: Diagnosis not present

## 2019-09-15 DIAGNOSIS — H608X3 Other otitis externa, bilateral: Secondary | ICD-10-CM | POA: Diagnosis not present

## 2019-09-16 DIAGNOSIS — H9113 Presbycusis, bilateral: Secondary | ICD-10-CM | POA: Insufficient documentation

## 2019-09-16 DIAGNOSIS — H608X3 Other otitis externa, bilateral: Secondary | ICD-10-CM | POA: Insufficient documentation

## 2019-09-16 DIAGNOSIS — H833X3 Noise effects on inner ear, bilateral: Secondary | ICD-10-CM | POA: Insufficient documentation

## 2019-10-03 DIAGNOSIS — H35363 Drusen (degenerative) of macula, bilateral: Secondary | ICD-10-CM | POA: Diagnosis not present

## 2019-10-03 DIAGNOSIS — H40012 Open angle with borderline findings, low risk, left eye: Secondary | ICD-10-CM | POA: Diagnosis not present

## 2019-10-03 DIAGNOSIS — H524 Presbyopia: Secondary | ICD-10-CM | POA: Diagnosis not present

## 2019-10-03 DIAGNOSIS — H35051 Retinal neovascularization, unspecified, right eye: Secondary | ICD-10-CM | POA: Diagnosis not present

## 2019-10-03 DIAGNOSIS — E119 Type 2 diabetes mellitus without complications: Secondary | ICD-10-CM | POA: Diagnosis not present

## 2019-10-03 DIAGNOSIS — H401212 Low-tension glaucoma, right eye, moderate stage: Secondary | ICD-10-CM | POA: Diagnosis not present

## 2019-10-05 DIAGNOSIS — R69 Illness, unspecified: Secondary | ICD-10-CM | POA: Diagnosis not present

## 2019-10-14 DIAGNOSIS — H353211 Exudative age-related macular degeneration, right eye, with active choroidal neovascularization: Secondary | ICD-10-CM | POA: Diagnosis not present

## 2019-10-14 DIAGNOSIS — Z961 Presence of intraocular lens: Secondary | ICD-10-CM | POA: Diagnosis not present

## 2019-10-14 DIAGNOSIS — H353132 Nonexudative age-related macular degeneration, bilateral, intermediate dry stage: Secondary | ICD-10-CM | POA: Diagnosis not present

## 2019-10-14 DIAGNOSIS — H35721 Serous detachment of retinal pigment epithelium, right eye: Secondary | ICD-10-CM | POA: Diagnosis not present

## 2019-11-04 DIAGNOSIS — R972 Elevated prostate specific antigen [PSA]: Secondary | ICD-10-CM | POA: Diagnosis not present

## 2019-11-13 DIAGNOSIS — H35721 Serous detachment of retinal pigment epithelium, right eye: Secondary | ICD-10-CM | POA: Diagnosis not present

## 2019-11-13 DIAGNOSIS — E119 Type 2 diabetes mellitus without complications: Secondary | ICD-10-CM | POA: Diagnosis not present

## 2019-11-13 DIAGNOSIS — H353132 Nonexudative age-related macular degeneration, bilateral, intermediate dry stage: Secondary | ICD-10-CM | POA: Diagnosis not present

## 2019-11-13 DIAGNOSIS — H353211 Exudative age-related macular degeneration, right eye, with active choroidal neovascularization: Secondary | ICD-10-CM | POA: Diagnosis not present

## 2019-12-18 DIAGNOSIS — H35721 Serous detachment of retinal pigment epithelium, right eye: Secondary | ICD-10-CM | POA: Diagnosis not present

## 2019-12-18 DIAGNOSIS — H353211 Exudative age-related macular degeneration, right eye, with active choroidal neovascularization: Secondary | ICD-10-CM | POA: Diagnosis not present

## 2019-12-18 DIAGNOSIS — H353132 Nonexudative age-related macular degeneration, bilateral, intermediate dry stage: Secondary | ICD-10-CM | POA: Diagnosis not present

## 2019-12-18 DIAGNOSIS — E119 Type 2 diabetes mellitus without complications: Secondary | ICD-10-CM | POA: Diagnosis not present

## 2019-12-22 ENCOUNTER — Ambulatory Visit: Payer: Medicare HMO | Attending: Internal Medicine

## 2019-12-22 DIAGNOSIS — Z23 Encounter for immunization: Secondary | ICD-10-CM | POA: Insufficient documentation

## 2019-12-22 NOTE — [facility] Notes (Signed)
   Covid-19 Vaccination Clinic  Name:  Kevin Suarez    MRN: [id number] DOB: 1941/02/13  12/22/2019  Mr. Shadwick was observed post Covid-19 immunization for 15 minutes without incidence. He was provided with Vaccine Information Sheet and instruction to access the V-Safe system.   Mr. Mccullough was instructed to call 911 with any severe reactions post vaccine: Marland Kitchen Difficulty breathing  . Swelling of your face and throat  . A fast heartbeat  . A bad rash all over your body  . Dizziness and weakness    Immunizations Administered    Name Date Dose VIS Date Route   Pfizer COVID-19 Vaccine 12/22/2019  4:10 PM 0.3 mL 10/24/2019 Intramuscular   Manufacturer: [place]   Lot: [id number]   [place]: [id number]

## 2019-12-31 DIAGNOSIS — E538 Deficiency of other specified B group vitamins: Secondary | ICD-10-CM | POA: Diagnosis not present

## 2020-01-02 DIAGNOSIS — Z0000 Encounter for general adult medical examination without abnormal findings: Secondary | ICD-10-CM | POA: Diagnosis not present

## 2020-01-02 DIAGNOSIS — E7849 Other hyperlipidemia: Secondary | ICD-10-CM | POA: Diagnosis not present

## 2020-01-02 DIAGNOSIS — Z125 Encounter for screening for malignant neoplasm of prostate: Secondary | ICD-10-CM | POA: Diagnosis not present

## 2020-01-02 DIAGNOSIS — E1140 Type 2 diabetes mellitus with diabetic neuropathy, unspecified: Secondary | ICD-10-CM | POA: Diagnosis not present

## 2020-01-09 DIAGNOSIS — R69 Illness, unspecified: Secondary | ICD-10-CM | POA: Diagnosis not present

## 2020-01-09 DIAGNOSIS — K219 Gastro-esophageal reflux disease without esophagitis: Secondary | ICD-10-CM | POA: Diagnosis not present

## 2020-01-09 DIAGNOSIS — E669 Obesity, unspecified: Secondary | ICD-10-CM | POA: Diagnosis not present

## 2020-01-09 DIAGNOSIS — N182 Chronic kidney disease, stage 2 (mild): Secondary | ICD-10-CM | POA: Diagnosis not present

## 2020-01-09 DIAGNOSIS — Z1331 Encounter for screening for depression: Secondary | ICD-10-CM | POA: Diagnosis not present

## 2020-01-09 DIAGNOSIS — R82998 Other abnormal findings in urine: Secondary | ICD-10-CM | POA: Diagnosis not present

## 2020-01-09 DIAGNOSIS — E1129 Type 2 diabetes mellitus with other diabetic kidney complication: Secondary | ICD-10-CM | POA: Diagnosis not present

## 2020-01-09 DIAGNOSIS — E538 Deficiency of other specified B group vitamins: Secondary | ICD-10-CM | POA: Diagnosis not present

## 2020-01-09 DIAGNOSIS — Z1339 Encounter for screening examination for other mental health and behavioral disorders: Secondary | ICD-10-CM | POA: Diagnosis not present

## 2020-01-09 DIAGNOSIS — I129 Hypertensive chronic kidney disease with stage 1 through stage 4 chronic kidney disease, or unspecified chronic kidney disease: Secondary | ICD-10-CM | POA: Diagnosis not present

## 2020-01-09 DIAGNOSIS — E785 Hyperlipidemia, unspecified: Secondary | ICD-10-CM | POA: Diagnosis not present

## 2020-01-09 DIAGNOSIS — R809 Proteinuria, unspecified: Secondary | ICD-10-CM | POA: Diagnosis not present

## 2020-01-09 DIAGNOSIS — Z0000 Encounter for general adult medical examination without abnormal findings: Secondary | ICD-10-CM | POA: Diagnosis not present

## 2020-01-09 DIAGNOSIS — N401 Enlarged prostate with lower urinary tract symptoms: Secondary | ICD-10-CM | POA: Diagnosis not present

## 2020-01-09 DIAGNOSIS — E1140 Type 2 diabetes mellitus with diabetic neuropathy, unspecified: Secondary | ICD-10-CM | POA: Diagnosis not present

## 2020-01-16 ENCOUNTER — Ambulatory Visit: Payer: Medicare HMO | Attending: Internal Medicine

## 2020-01-16 DIAGNOSIS — Z23 Encounter for immunization: Secondary | ICD-10-CM | POA: Insufficient documentation

## 2020-01-16 NOTE — [facility] Notes (Signed)
   Covid-19 Vaccination Clinic  Name:  Kevin Suarez    MRN: [id number] DOB: 10-Sep-1941  01/16/2020  [name] was observed post Covid-19 immunization for 15 minutes without incident. He was provided with Vaccine Information Sheet and instruction to access the V-Safe system.   [name] was instructed to call 911 with any severe reactions post vaccine: Marland Kitchen Difficulty breathing  . Swelling of face and throat  . A fast heartbeat  . A bad rash all over body  . Dizziness and weakness   Immunizations Administered    Name Date Dose VIS Date Route   Pfizer COVID-19 Vaccine 01/16/2020 12:40 PM 0.3 mL 10/24/2019 Intramuscular   Manufacturer: [place]   Lot: [id number]   [place]: [id number]

## 2020-01-26 DIAGNOSIS — H35721 Serous detachment of retinal pigment epithelium, right eye: Secondary | ICD-10-CM | POA: Diagnosis not present

## 2020-01-26 DIAGNOSIS — E119 Type 2 diabetes mellitus without complications: Secondary | ICD-10-CM | POA: Diagnosis not present

## 2020-01-26 DIAGNOSIS — H353211 Exudative age-related macular degeneration, right eye, with active choroidal neovascularization: Secondary | ICD-10-CM | POA: Diagnosis not present

## 2020-01-26 DIAGNOSIS — H353132 Nonexudative age-related macular degeneration, bilateral, intermediate dry stage: Secondary | ICD-10-CM | POA: Diagnosis not present

## 2020-02-03 DIAGNOSIS — Z1212 Encounter for screening for malignant neoplasm of rectum: Secondary | ICD-10-CM | POA: Diagnosis not present

## 2020-02-06 DIAGNOSIS — E538 Deficiency of other specified B group vitamins: Secondary | ICD-10-CM | POA: Diagnosis not present

## 2020-02-06 DIAGNOSIS — R140 Abdominal distension (gaseous): Secondary | ICD-10-CM | POA: Diagnosis not present

## 2020-02-06 DIAGNOSIS — K219 Gastro-esophageal reflux disease without esophagitis: Secondary | ICD-10-CM | POA: Diagnosis not present

## 2020-03-02 DIAGNOSIS — M48061 Spinal stenosis, lumbar region without neurogenic claudication: Secondary | ICD-10-CM | POA: Diagnosis not present

## 2020-03-02 DIAGNOSIS — M545 Low back pain: Secondary | ICD-10-CM | POA: Diagnosis not present

## 2020-03-02 DIAGNOSIS — M47816 Spondylosis without myelopathy or radiculopathy, lumbar region: Secondary | ICD-10-CM | POA: Diagnosis not present

## 2020-03-05 DIAGNOSIS — M545 Low back pain: Secondary | ICD-10-CM | POA: Diagnosis not present

## 2020-03-10 DIAGNOSIS — H35721 Serous detachment of retinal pigment epithelium, right eye: Secondary | ICD-10-CM | POA: Insufficient documentation

## 2020-03-10 DIAGNOSIS — H35361 Drusen (degenerative) of macula, right eye: Secondary | ICD-10-CM | POA: Insufficient documentation

## 2020-03-10 DIAGNOSIS — E119 Type 2 diabetes mellitus without complications: Secondary | ICD-10-CM | POA: Insufficient documentation

## 2020-03-10 DIAGNOSIS — H353132 Nonexudative age-related macular degeneration, bilateral, intermediate dry stage: Secondary | ICD-10-CM | POA: Insufficient documentation

## 2020-03-10 DIAGNOSIS — H353211 Exudative age-related macular degeneration, right eye, with active choroidal neovascularization: Secondary | ICD-10-CM | POA: Insufficient documentation

## 2020-03-10 DIAGNOSIS — H35362 Drusen (degenerative) of macula, left eye: Secondary | ICD-10-CM | POA: Insufficient documentation

## 2020-03-10 DIAGNOSIS — Z961 Presence of intraocular lens: Secondary | ICD-10-CM | POA: Insufficient documentation

## 2020-03-11 ENCOUNTER — Encounter (INDEPENDENT_AMBULATORY_CARE_PROVIDER_SITE_OTHER): Payer: Self-pay | Admitting: Ophthalmology

## 2020-03-11 ENCOUNTER — Ambulatory Visit (INDEPENDENT_AMBULATORY_CARE_PROVIDER_SITE_OTHER): Payer: Medicare HMO | Admitting: Ophthalmology

## 2020-03-11 ENCOUNTER — Other Ambulatory Visit: Payer: Self-pay

## 2020-03-11 DIAGNOSIS — H35362 Drusen (degenerative) of macula, left eye: Secondary | ICD-10-CM

## 2020-03-11 DIAGNOSIS — E119 Type 2 diabetes mellitus without complications: Secondary | ICD-10-CM | POA: Diagnosis not present

## 2020-03-11 DIAGNOSIS — H353211 Exudative age-related macular degeneration, right eye, with active choroidal neovascularization: Secondary | ICD-10-CM

## 2020-03-11 DIAGNOSIS — H35721 Serous detachment of retinal pigment epithelium, right eye: Secondary | ICD-10-CM

## 2020-03-11 DIAGNOSIS — H35361 Drusen (degenerative) of macula, right eye: Secondary | ICD-10-CM | POA: Diagnosis not present

## 2020-03-11 DIAGNOSIS — H353132 Nonexudative age-related macular degeneration, bilateral, intermediate dry stage: Secondary | ICD-10-CM

## 2020-03-11 DIAGNOSIS — Z961 Presence of intraocular lens: Secondary | ICD-10-CM

## 2020-03-11 DIAGNOSIS — M545 Low back pain: Secondary | ICD-10-CM | POA: Diagnosis not present

## 2020-03-11 MED ORDER — BEVACIZUMAB CHEMO INJECTION 1.25MG/0.05ML SYRINGE FOR KALEIDOSCOPE
1.2500 mg | INTRAVITREAL | Status: AC | PRN
  Administered 2020-03-11: 1.25 mg via INTRAVITREAL

## 2020-03-11 NOTE — Progress Notes (Signed)
03/11/2020     CHIEF COMPLAINT Patient presents for Retina Follow Up   HISTORY OF PRESENT ILLNESS: Kevin Suarez is a 79 y.o. male who presents to the clinic today for:   HPI    Retina Follow Up    Patient presents with  Wet AMD.  In right eye.  Severity is moderate.  Duration of 6 weeks.  Since onset it is stable.  I, the attending physician,  performed the HPI with the patient and updated documentation appropriately.          Comments    6 Week AMD f\u OD. Possible Avastin OD. OCT  Pt states vision is stable. Denies any complaints.       Last edited by Tilda Franco on 03/11/2020  9:14 AM. (History)      Referring physician: Haywood Pao, MD [place],  [place] [address]  HISTORICAL INFORMATION:   Selected notes from the [facility]    Lab Results  Component Value Date   HGBA1C 6.8 (H) 10/21/2015     CURRENT MEDICATIONS: No current outpatient medications on file. (Ophthalmic Drugs)   No current facility-administered medications for this visit. (Ophthalmic Drugs)   Current Outpatient Medications (Other)  Medication Sig  . aspirin EC 81 MG tablet Take 81 mg by mouth daily.  . ciprofloxacin (CIPRO) 500 MG tablet Take 1 tablet (500 mg total) by mouth 2 (two) times daily.  Marland Kitchen HYDROcodone-acetaminophen (NORCO) 5-325 MG tablet Take 1 tablet by mouth every 6 (six) hours as needed.  Marland Kitchen losartan (COZAAR) 50 MG tablet Take 50 mg by mouth every morning.  . metFORMIN (GLUCOPHAGE) 500 MG tablet Take 500 mg by mouth 2 (two) times daily with a meal.   . Meth-Hyo-M Bl-Na Phos-Ph Sal (URIBEL) 118 MG CAPS Take 118 mg by mouth daily.  . NONFORMULARY OR COMPOUNDED ITEM [facility]:  [place]illes Tendonitis Cream - Diclofenac 3%, Doxepin 5%, Bupivacaine 1%, Gabapentin 6%, Ibuprofen 3%, Pentoxifylline 3%, apply 1-2 grams to both feet 3-4 times daily  . phenazopyridine (PYRIDIUM) 200 MG tablet Take 1 tablet (200 mg total) by mouth 3 (three) times daily  as needed for pain.  . tamsulosin (FLOMAX) 0.4 MG CAPS capsule Take 0.4 mg by mouth at bedtime.   No current facility-administered medications for this visit. (Other)      REVIEW OF SYSTEMS:    ALLERGIES Allergies  Allergen Reactions  . Prednisone Other (See Comments)    Doesn't like side effects of prednisone.     PAST MEDICAL HISTORY Past Medical History:  Diagnosis Date  . Bladder stone   . BPH (benign prostatic hypertrophy)   . Frequency of urination   . 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 History of kidney stones   . Hyperlipidemia   . Hypertension   . Lung nodules    LAST CT 2012  PER DR WRIGHT NOTE NO FURTHER WORK-UP (PULMOLOGIST)  . Nephrolithiasis    bilateral  . Type 2 diabetes mellitus ([place])   . Wears dentures    upper  . Wears glasses   . Wears partial dentures    Past Surgical History:  Procedure Laterality Date  . CATARACT EXTRACTION W/ INTRAOCULAR LENS  IMPLANT, BILATERAL    . CYSTOSCOPY WITH LITHOLAPAXY N/A 11/28/2013   Procedure: CYSTOSCOPY WITH stone retrieval;  Surgeon: Claybon Jabs, MD;  Location: [facility];  Service: Urology;  Laterality: N/A;  . CYSTOSCOPY WITH LITHOLAPAXY N/A 10/15/2015   Procedure: CYSTOSCOPY  WITH LITHOLAPAXY, HOLMIUM LASER LITHOTRIPSY tranurethral excision of prostate;  Surgeon: [name], MD;  Location: [facility];  Service: Urology;  Laterality: N/A;  . EXTRACORPOREAL SHOCK WAVE LITHOTRIPSY Right 05-13-2015  . HOLMIUM LASER APPLICATION N/A [id number]   Procedure: HOLMIUM LASER APPLICATION;  Surgeon: [name], MD;  Location: [facility];  Service: Urology;  Laterality: N/A;  . TOTAL HIP ARTHROPLASTY Bilateral 2000  &  2005  . TRANSURETHRAL RESECTION OF PROSTATE  11/28/2013   Procedure: TRANSURETHRAL RESECTION OF THE PROSTATE WITH  BUTTON GYRUS INSTRUMENTS;  Surgeon: Claybon Jabs, MD;  Location: [facility];  Service: Urology;;  . WRIST SURGERY Left 2005   INJURY     FAMILY HISTORY Family History  Problem Relation Age of Onset  . Cancer Mother        Unknown  . Cancer Maternal Grandmother   . Heart attack Paternal Grandfather   . Coronary artery disease Father   . Diabetes Father     SOCIAL HISTORY Social History   Tobacco Use  . Smoking status: Former Smoker    Packs/day: 2.00    Years: 30.00    Pack years: 60.00    Types: Cigarettes    Quit date: 11/14/1995    Years since quitting: 24.3  . Smokeless tobacco: Never Used  Substance Use Topics  . Alcohol use: No  . Drug use: No         OPHTHALMIC EXAM:  Base Eye Exam    Visual Acuity (Snellen - Linear)      Right Left   Dist cc 20/25 -2 20/20   Correction: Glasses       Tonometry (Tonopen, 9:19 AM)      Right Left   Pressure 9 12       Pupils      Pupils Dark Light Shape React APD   Right PERRL 2 1.5 Round Brisk None   Left PERRL 2 1.5 Round Brisk None       Visual Fields (Counting fingers)      Left Right    Full Full       Neuro/Psych    Oriented x3: Yes   Mood/Affect: Normal       Dilation    Right eye: 1.0% Mydriacyl, 2.5% Phenylephrine @ 9:19 AM        Slit Lamp and Fundus Exam    External Exam      Right Left   External Normal Normal       Slit Lamp Exam      Right Left   Lids/Lashes Normal Normal   Conjunctiva/Sclera White and quiet White and quiet   Cornea Clear Clear   Anterior Chamber Deep and quiet Deep and quiet   Iris Round and reactive Round and reactive   Lens Posterior chamber intraocular lens Posterior chamber intraocular lens   Anterior Vitreous Normal Normal       Fundus Exam      Right Left   Posterior Vitreous Normal    Disc Normal    C/D Ratio 0.55    Macula Retinal pigment epithelial atrophy, Early age related macular degeneration, Macular thickening, no hemorrhage, Mottling, Retinal pigment epithelial mottling    Vessels Normal    Periphery Normal           IMAGING AND PROCEDURES  Imaging and Procedures for  03/11/20  OCT, Retina - OU - Both Eyes       Right Eye Quality was good.  Scan locations included subfoveal. Central Foveal Thickness: 284. Progression has improved. Findings include subretinal fluid, subretinal hyper-reflective material.   Left Eye Quality was good. Scan locations included subfoveal. Central Foveal Thickness: 270. Progression has improved. Findings include retinal drusen .   Notes OD, subretinal fluid pigment epithelial serous detachment continues.  This lesion remains stable on periodic antiveg F therapy intravitreal Avastin.  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 9:57 AM. Confirmed correct patient, procedure, site, and patient consented.   Anesthesia Topical anesthesia was used. Anesthetic medications included Akten 3.5%.   Procedure Preparation included 5% betadine to ocular surface, 10% betadine to eyelids, Ofloxacin . A 30 gauge needle was used.   Injection:  1.25 mg Bevacizumab (AVASTIN) SOLN   NDC: [id number], Lot: [id number]   Route: Intravitreal, Site: Right Eye, Waste: 0 mg  Post-op Post injection exam found visual acuity of at least counting fingers. The patient tolerated the procedure well. There were no complications. The patient received written and verbal post procedure care education. Post injection medications were not given.                 ASSESSMENT/PLAN:  No problem-specific Assessment & Plan notes found for this encounter.      ICD-10-CM   1. Exudative age-related macular degeneration of right eye with active choroidal neovascularization (HCC)  H35.3211 OCT, Retina - OU - Both Eyes    Intravitreal Injection, Pharmacologic Agent - OD - Right Eye    Bevacizumab (AVASTIN) SOLN 1.25 mg  2. Intermediate stage nonexudative age-related macular degeneration of both  eyes  H35.3132 OCT, Retina - OU - Both Eyes  3. Serous detachment of retinal pigment epithelium of right eye  H35.721   4. Diabetes mellitus without complication ([place])  [id number]   5. Pseudophakia  Z96.1   6. Degenerative retinal drusen of left eye  H35.362   7. Degenerative retinal drusen of right eye  H35.361     1.  OD, repeat intravitreal Avastin today.  2.  No evidence of diabetic eye disease. 3.  Ophthalmic Meds Ordered this visit:  Meds ordered this encounter  Medications  . Bevacizumab (AVASTIN) SOLN 1.25 mg       No follow-ups on file.  There are no Patient Instructions on file for this visit.   Explained the diagnoses, plan, and follow up with the patient and they expressed understanding.  Patient expressed understanding of the importance of proper follow up care.   Clent Demark Kierra Jezewski M.D. Diseases & Surgery of the Retina and Vitreous Retina & Diabetic [place] 03/11/20     Abbreviations: M myopia (nearsighted); A astigmatism; H hyperopia (farsighted); P presbyopia; Mrx spectacle prescription;  CTL contact lenses; OD right eye; OS left eye; OU both eyes  XT exotropia; ET esotropia; PEK punctate epithelial keratitis; PEE punctate epithelial erosions; DES dry eye syndrome; MGD meibomian gland dysfunction; ATs artificial tears; PFAT's preservative free artificial tears; [place] nuclear sclerotic cataract; PSC posterior subcapsular cataract; ERM epi-retinal membrane; PVD posterior vitreous detachment; RD retinal detachment; DM diabetes mellitus; DR diabetic retinopathy; NPDR non-proliferative diabetic retinopathy; PDR proliferative diabetic retinopathy; CSME clinically significant macular edema; DME diabetic macular edema; dbh dot blot hemorrhages; CWS cotton wool spot; POAG primary open angle glaucoma; C/D cup-to-disc ratio; HVF humphrey visual field; GVF  goldmann visual field; OCT optical coherence tomography; IOP intraocular pressure; BRVO Branch retinal vein occlusion; CRVO  central retinal vein occlusion; CRAO central retinal artery occlusion; BRAO branch retinal artery occlusion; RT retinal tear; SB scleral buckle; PPV pars plana vitrectomy; VH Vitreous hemorrhage; PRP panretinal laser photocoagulation; IVK intravitreal kenalog; VMT vitreomacular traction; MH Macular hole;  NVD neovascularization of the disc; NVE neovascularization elsewhere; AREDS age related eye disease study; ARMD age related macular degeneration; POAG primary open angle glaucoma; EBMD epithelial/anterior basement membrane dystrophy; ACIOL anterior chamber intraocular lens; IOL intraocular lens; PCIOL posterior chamber intraocular lens; Phaco/IOL phacoemulsification with intraocular lens placement; [place] photorefractive keratectomy; LASIK laser assisted in situ keratomileusis; HTN hypertension; DM diabetes mellitus; COPD chronic obstructive pulmonary disease

## 2020-04-01 DIAGNOSIS — M545 Low back pain: Secondary | ICD-10-CM | POA: Diagnosis not present

## 2020-04-01 DIAGNOSIS — M47816 Spondylosis without myelopathy or radiculopathy, lumbar region: Secondary | ICD-10-CM | POA: Diagnosis not present

## 2020-04-04 DIAGNOSIS — R69 Illness, unspecified: Secondary | ICD-10-CM | POA: Diagnosis not present

## 2020-04-22 ENCOUNTER — Encounter (INDEPENDENT_AMBULATORY_CARE_PROVIDER_SITE_OTHER): Payer: Medicare HMO | Admitting: Ophthalmology

## 2020-04-22 DIAGNOSIS — M47816 Spondylosis without myelopathy or radiculopathy, lumbar region: Secondary | ICD-10-CM | POA: Diagnosis not present

## 2020-04-26 ENCOUNTER — Other Ambulatory Visit: Payer: Self-pay

## 2020-04-26 ENCOUNTER — Encounter (INDEPENDENT_AMBULATORY_CARE_PROVIDER_SITE_OTHER): Payer: Self-pay | Admitting: Ophthalmology

## 2020-04-26 ENCOUNTER — Ambulatory Visit (INDEPENDENT_AMBULATORY_CARE_PROVIDER_SITE_OTHER): Payer: Medicare HMO | Admitting: Ophthalmology

## 2020-04-26 DIAGNOSIS — E119 Type 2 diabetes mellitus without complications: Secondary | ICD-10-CM

## 2020-04-26 DIAGNOSIS — H353211 Exudative age-related macular degeneration, right eye, with active choroidal neovascularization: Secondary | ICD-10-CM | POA: Diagnosis not present

## 2020-04-26 DIAGNOSIS — H353132 Nonexudative age-related macular degeneration, bilateral, intermediate dry stage: Secondary | ICD-10-CM | POA: Diagnosis not present

## 2020-04-26 MED ORDER — BEVACIZUMAB CHEMO INJECTION 1.25MG/0.05ML SYRINGE FOR KALEIDOSCOPE
1.2500 mg | INTRAVITREAL | Status: AC | PRN
  Administered 2020-04-26: 1.25 mg via INTRAVITREAL

## 2020-04-26 NOTE — Assessment & Plan Note (Signed)
Wet ARMD, well controlled at 6-week interval stable fluid, repeat injection of Avastin intravitreal OD today and exam in 7 weeks

## 2020-04-26 NOTE — Assessment & Plan Note (Signed)

## 2020-04-26 NOTE — Progress Notes (Signed)
04/26/2020     CHIEF COMPLAINT Patient presents for Retina Follow Up   HISTORY OF PRESENT ILLNESS: Kevin Suarez is a 79 y.o. male who presents to the clinic today for:   HPI    Retina Follow Up    Patient presents with  Wet AMD.  In right eye.  Duration of 6 weeks.  Since onset it is stable.          Comments    6 wk fu- OCT, Poss Avastin OD Patient denies change in vision and overall has no complaints.        Last edited by Gerda Diss on 04/26/2020  9:02 AM. (History)      Referring physician: Haywood Pao, MD [place],  [place] [id number]  HISTORICAL INFORMATION:   Selected notes from the MEDICAL RECORD NUMBER    Lab Results  Component Value Date   HGBA1C 6.8 (H) 10/21/2015     CURRENT MEDICATIONS: No current outpatient medications on file. (Ophthalmic Drugs)   No current facility-administered medications for this visit. (Ophthalmic Drugs)   Current Outpatient Medications (Other)  Medication Sig  . aspirin EC 81 MG tablet Take 81 mg by mouth daily.  . ciprofloxacin (CIPRO) 500 MG tablet Take 1 tablet (500 mg total) by mouth 2 (two) times daily.  Marland Kitchen HYDROcodone-acetaminophen (NORCO) 5-325 MG tablet Take 1 tablet by mouth every 6 (six) hours as needed.  Marland Kitchen losartan (COZAAR) 50 MG tablet Take 50 mg by mouth every morning.  . metFORMIN (GLUCOPHAGE) 500 MG tablet Take 500 mg by mouth 2 (two) times daily with a meal.   . Meth-Hyo-M Bl-Na Phos-Ph Sal (URIBEL) 118 MG CAPS Take 118 mg by mouth daily.  . NONFORMULARY OR COMPOUNDED ITEM [facility]:  A[place]illes Tendonitis Cream - Diclofenac 3%, Doxepin 5%, Bupivacaine 1%, Gabapentin 6%, Ibuprofen 3%, Pentoxifylline 3%, apply 1-2 grams to both feet 3-4 times daily  . phenazopyridine (PYRIDIUM) 200 MG tablet Take 1 tablet (200 mg total) by mouth 3 (three) times daily as needed for pain.  . tamsulosin (FLOMAX) 0.4 MG CAPS capsule Take 0.4 mg by mouth at bedtime.   No current facility-administered  medications for this visit. (Other)      REVIEW OF SYSTEMS:    ALLERGIES Allergies  Allergen Reactions  . Prednisone Other (See Comments)    Doesn't like side effects of prednisone.     PAST MEDICAL HISTORY Past Medical History:  Diagnosis Date  . Bladder stone   . BPH (benign prostatic hypertrophy)   . Frequency of urination   . 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 History of kidney stones   . Hyperlipidemia   . Hypertension   . Lung nodules    LAST CT 2012  PER DR WRIGHT NOTE NO FURTHER WORK-UP (PULMOLOGIST)  . Nephrolithiasis    bilateral  . Type 2 diabetes mellitus ([place])   . Wears dentures    upper  . Wears glasses   . Wears partial dentures    Past Surgical History:  Procedure Laterality Date  . CATARACT EXTRACTION W/ INTRAOCULAR LENS  IMPLANT, BILATERAL    . CYSTOSCOPY WITH LITHOLAPAXY N/A 11/28/2013   Procedure: CYSTOSCOPY WITH stone retrieval;  Surgeon: Claybon Jabs, MD;  Location: [facility];  Service: Urology;  Laterality: N/A;  . CYSTOSCOPY WITH LITHOLAPAXY N/A 10/15/2015   Procedure: CYSTOSCOPY WITH LITHOLAPAXY, HOLMIUM LASER LITHOTRIPSY tranurethral excision of prostate;  Surgeon: [name], MD;  Location: [facility];  Service: Urology;  Laterality: N/A;  . EXTRACORPOREAL SHOCK WAVE LITHOTRIPSY Right 05-13-2015  . HOLMIUM LASER APPLICATION N/A 56/12/1306   Procedure: HOLMIUM LASER APPLICATION;  Surgeon: [name], MD;  Location: [facility];  Service: Urology;  Laterality: N/A;  . TOTAL HIP ARTHROPLASTY Bilateral 2000  &  2005  . TRANSURETHRAL RESECTION OF PROSTATE  11/28/2013   Procedure: TRANSURETHRAL RESECTION OF THE PROSTATE WITH  BUTTON GYRUS INSTRUMENTS;  Surgeon: Claybon Jabs, MD;  Location: [facility];  Service: Urology;;  . WRIST SURGERY Left 2005   INJURY    FAMILY HISTORY Family History  Problem Relation Age of Onset  . Cancer Mother        Unknown  . Cancer Maternal Grandmother    . Heart attack Paternal Grandfather   . Coronary artery disease Father   . Diabetes Father     SOCIAL HISTORY Social History   Tobacco Use  . Smoking status: Former Smoker    Packs/day: 2.00    Years: 30.00    Pack years: 60.00    Types: Cigarettes    Quit date: 11/14/1995    Years since quitting: 24.4  . Smokeless tobacco: Never Used  Substance Use Topics  . Alcohol use: No  . Drug use: No         OPHTHALMIC EXAM:  Base Eye Exam    Visual Acuity (Snellen - Linear)      Right Left   Dist cc 20/20-2 20/20       Tonometry (Tonopen, 9:06 AM)      Right Left   Pressure 16 13       Visual Fields (Counting fingers)      Left Right    Full Full       Neuro/Psych    Oriented x3: Yes   Mood/Affect: Normal       Dilation    Right eye: 1.0% Mydriacyl, 2.5% Phenylephrine @ 9:06 AM        Slit Lamp and Fundus Exam    External Exam      Right Left   External Normal Normal       Slit Lamp Exam      Right Left   Lids/Lashes Normal Normal   Conjunctiva/Sclera White and quiet White and quiet   Cornea Clear Clear   Anterior Chamber Deep and quiet Deep and quiet   Iris Round and reactive Round and reactive   Lens Posterior chamber intraocular lens Posterior chamber intraocular lens   Anterior Vitreous Normal Normal       Fundus Exam      Right Left   Posterior Vitreous , Posterior vitreous detachment    Disc Normal    C/D Ratio 0.55    Macula Retinal pigment epithelial atrophy, Early age related macular degeneration, Macular thickening subretinal fluid, no hemorrhage, Mottling, Retinal pigment epithelial mottling    Vessels ,,, NO DR    Periphery Normal           IMAGING AND PROCEDURES  Imaging and Procedures for 04/26/20  OCT, Retina - OU - Both Eyes       Right Eye Quality was good. Scan locations included subfoveal. Central Foveal Thickness: 261. Findings include subretinal scarring, subretinal fluid, retinal drusen , no IRF.   Left  Eye Quality was good. Scan locations included subfoveal. Central Foveal Thickness: 276. Findings include retinal drusen , no SRF, no IRF.   Notes ARMD OD well controlled at 6-week interval on intravitreal  Avastin however there is still residual subretinal fluid juxta foveal       Intravitreal Injection, Pharmacologic Agent - OD - Right Eye       Time Out 04/26/2020. 10:00 AM. Confirmed correct patient, procedure, site, and patient consented.   Anesthesia Topical anesthesia was used. Anesthetic medications included Akten 3.5%.   Procedure Preparation included 10% betadine to eyelids, Tobramycin 0.3%. A 30 gauge needle was used.   Injection:  1.25 mg Bevacizumab (AVASTIN) SOLN   NDC: [address]-6   Route: Intravitreal, Site: Right Eye, Waste: 0 mg  Post-op Post injection exam found visual acuity of at least counting fingers. The patient tolerated the procedure well. There were no complications. The patient received written and verbal post procedure care education. Post injection medications were not given.                 ASSESSMENT/PLAN:  Exudative age-related macular degeneration of right eye with active choroidal neovascularization (HCC) Wet ARMD, well controlled at 6-week interval stable fluid, repeat injection of Avastin intravitreal OD today and exam in 7 weeks  Diabetes mellitus without complication ([place]) The patient has diabetes without any evidence of retinopathy. The patient advised to maintain good blood glucose control, excellent blood pressure control, and favorable levels of cholesterol, low density lipoprotein, and high density lipoproteins. Follow up in 1 year was recommended. Explained that fluctuations in visual acuity , or "out of focus", may result from large variations of blood sugar control.      ICD-10-CM   1. Exudative age-related macular degeneration of right eye with active choroidal neovascularization (HCC)  H35.3211 OCT, Retina - OU - Both Eyes     Intravitreal Injection, Pharmacologic Agent - OD - Right Eye    Bevacizumab (AVASTIN) SOLN 1.25 mg  2. Intermediate stage nonexudative age-related macular degeneration of both eyes  H35.3132   3. Diabetes mellitus without complication ([place])  T03.5     1.  Intravitreal Avastin OD today, and I will reexamine OD and 7 weeks  2.  3.  Ophthalmic Meds Ordered this visit:  Meds ordered this encounter  Medications  . Bevacizumab (AVASTIN) SOLN 1.25 mg       Return in about 7 weeks (around 06/14/2020) for dilate, OD, AVASTIN OCT.  There are no Patient Instructions on file for this visit.   Explained the diagnoses, plan, and follow up with the patient and they expressed understanding.  Patient expressed understanding of the importance of proper follow up care.   Clent Demark Malek Skog M.D. Diseases & Surgery of the Retina and Vitreous Retina & Diabetic [place] 04/26/20     Abbreviations: M myopia (nearsighted); A astigmatism; H hyperopia (farsighted); P presbyopia; Mrx spectacle prescription;  CTL contact lenses; OD right eye; OS left eye; OU both eyes  XT exotropia; ET esotropia; PEK punctate epithelial keratitis; PEE punctate epithelial erosions; DES dry eye syndrome; MGD meibomian gland dysfunction; ATs artificial tears; PFAT's preservative free artificial tears; [place] nuclear sclerotic cataract; PSC posterior subcapsular cataract; ERM epi-retinal membrane; PVD posterior vitreous detachment; RD retinal detachment; DM diabetes mellitus; DR diabetic retinopathy; NPDR non-proliferative diabetic retinopathy; PDR proliferative diabetic retinopathy; CSME clinically significant macular edema; DME diabetic macular edema; dbh dot blot hemorrhages; CWS cotton wool spot; POAG primary open angle glaucoma; C/D cup-to-disc ratio; HVF humphrey visual field; GVF goldmann visual field; OCT optical coherence tomography; IOP intraocular pressure; BRVO Branch retinal vein occlusion; CRVO central retinal vein  occlusion; CRAO central retinal artery occlusion; BRAO branch retinal artery occlusion; RT  retinal tear; SB scleral buckle; PPV pars plana vitrectomy; VH Vitreous hemorrhage; PRP panretinal laser photocoagulation; IVK intravitreal kenalog; VMT vitreomacular traction; MH Macular hole;  NVD neovascularization of the disc; NVE neovascularization elsewhere; AREDS age related eye disease study; ARMD age related macular degeneration; POAG primary open angle glaucoma; EBMD epithelial/anterior basement membrane dystrophy; ACIOL anterior chamber intraocular lens; IOL intraocular lens; PCIOL posterior chamber intraocular lens; Phaco/IOL phacoemulsification with intraocular lens placement; [place] photorefractive keratectomy; LASIK laser assisted in situ keratomileusis; HTN hypertension; DM diabetes mellitus; COPD chronic obstructive pulmonary disease

## 2020-05-03 DIAGNOSIS — R972 Elevated prostate specific antigen [PSA]: Secondary | ICD-10-CM | POA: Diagnosis not present

## 2020-05-10 DIAGNOSIS — R3914 Feeling of incomplete bladder emptying: Secondary | ICD-10-CM | POA: Diagnosis not present

## 2020-05-10 DIAGNOSIS — N5201 Erectile dysfunction due to arterial insufficiency: Secondary | ICD-10-CM | POA: Diagnosis not present

## 2020-05-10 DIAGNOSIS — R972 Elevated prostate specific antigen [PSA]: Secondary | ICD-10-CM | POA: Diagnosis not present

## 2020-05-10 DIAGNOSIS — N401 Enlarged prostate with lower urinary tract symptoms: Secondary | ICD-10-CM | POA: Diagnosis not present

## 2020-05-24 DIAGNOSIS — M545 Low back pain: Secondary | ICD-10-CM | POA: Diagnosis not present

## 2020-05-24 DIAGNOSIS — M47816 Spondylosis without myelopathy or radiculopathy, lumbar region: Secondary | ICD-10-CM | POA: Diagnosis not present

## 2020-05-27 DIAGNOSIS — H0102A Squamous blepharitis right eye, upper and lower eyelids: Secondary | ICD-10-CM | POA: Diagnosis not present

## 2020-05-27 DIAGNOSIS — H0102B Squamous blepharitis left eye, upper and lower eyelids: Secondary | ICD-10-CM | POA: Diagnosis not present

## 2020-05-27 DIAGNOSIS — H04123 Dry eye syndrome of bilateral lacrimal glands: Secondary | ICD-10-CM | POA: Diagnosis not present

## 2020-06-02 ENCOUNTER — Ambulatory Visit: Payer: Self-pay | Admitting: General Surgery

## 2020-06-02 DIAGNOSIS — K4090 Unilateral inguinal hernia, without obstruction or gangrene, not specified as recurrent: Secondary | ICD-10-CM | POA: Diagnosis not present

## 2020-06-02 NOTE — H&P (Signed)
  History of Present Illness Kevin Suarez; 06/02/2020 10:14 AM) The patient is a 79 year old male who presents with an inguinal hernia. Patient follow back up today secondary to left inguinal hernia. Patient was seen here approximately 1 year ago. He states that since that time the hernia has increased in size. He states that he has more discomfort and pain. Patient continued to have back pain. He is currently to undergo injection secondary to back pain.  Patient states he continues with dysuria.  Patient's had no previous abdominal surgery. ----------------------------------------------------------------------  Referred by: Dr. [name] Chief Complaint: Left inguinal hernia  Patient is a 79 year old male with a history of hypertension, diabetes, chronic kidney disease, who comes in with a left inguinal hernia. Patient states his been there for approximately 2 months. He states that he has noticed both the left inguinal area. He states she's been using a truss. He states this has helped minimally. Patient states he is not very active secondary to back pain. Patient states he only notices a hernia when he has a bowel movement. Patient had no signs or symptoms of incarceration or strangulation.  Patient's had no previous abdominal surgery.    Allergies [name], [name]; 06/02/2020 9:54 AM) No Known Drug Allergies  [03/28/2019]: Allergies Reconciled   Medication History [name], [name]; 06/02/2020 9:54 AM) Ciprofloxacin HCl (250MG  Tablet, Oral) Active. Clobetasol Propionate (0.05% Cream, External) Active. Diclofenac Sodium (75MG  Tablet DR, Oral) Active. Finasteride (5MG  Tablet, Oral) Active. Losartan Potassium (50MG  Tablet, Oral) Active. metFORMIN HCl (500MG  Tablet, Oral) Active. Tamsulosin HCl (0.4MG  Capsule, Oral) Active. tiZANidine HCl (2MG  Tablet, Oral) Active. OneTouch Verio (In Vitro) Active. Medications Reconciled    Review of Systems  Kevin Ok, Suarez; 06/02/2020 10:15 AM) All other systems negative  Vitals Lattie Haw Caldwell RMA; 06/02/2020 9:55 AM) 06/02/2020 9:54 AM Weight: 235 lb Height: 70in Body Surface Area: 2.24 m Body Mass Index: 33.72 kg/m  Temp.: 20F  Pulse: 63 (Regular)  P.OX: 97% (Room air) BP: 116/74(Sitting, Left Arm, Standard)       Physical Exam Kevin Suarez; 06/02/2020 10:15 AM) The physical exam findings are as follows: Note: Constitutional: No acute distress, conversant, appears stated age  Eyes: Anicteric sclerae, moist conjunctiva, no lid lag  Neck: No thyromegaly, trachea midline, no cervical lymphadenopathy  Lungs: Clear to auscultation biilaterally, normal respiratory effot  Cardiovascular: regular rate & rhythm, no murmurs, no peripheal edema, pedal pulses 2+  GI: Soft, no masses or hepatosplenomegaly, non-tender to palpation  MSK: Normal gait, no clubbing cyanosis, edema  Skin: No rashes, palpation reveals normal skin turgor  Psychiatric: Appropriate judgment and insight, oriented to person, place, and time  Abdomen Inspection Hernias - Inguinal hernia - Left - Reducible - Left (Large) .    Assessment & Plan Kevin Suarez; 06/02/2020 10:15 AM) LEFT INGUINAL HERNIA (K40.90) Impression: 79 year old male with a history of hypertension, diabetes, chronic kidney disease, in the left inguinal hernia.  1. The patient will like to proceed to the operating room for robotic left inguinal hernia repair with mesh.  2. I discussed with the patient the signs and symptoms of incarceration and strangulation and the need to proceed to the ER should they occur.  3. I discussed with the patient the risks and benefits of the procedure to include but not limited to: Infection, bleedi

## 2020-06-03 DIAGNOSIS — M545 Low back pain: Secondary | ICD-10-CM | POA: Diagnosis not present

## 2020-06-03 DIAGNOSIS — M47816 Spondylosis without myelopathy or radiculopathy, lumbar region: Secondary | ICD-10-CM | POA: Diagnosis not present

## 2020-06-08 ENCOUNTER — Other Ambulatory Visit: Payer: Self-pay | Admitting: Gastroenterology

## 2020-06-08 DIAGNOSIS — K5730 Diverticulosis of large intestine without perforation or abscess without bleeding: Secondary | ICD-10-CM | POA: Diagnosis not present

## 2020-06-08 DIAGNOSIS — K219 Gastro-esophageal reflux disease without esophagitis: Secondary | ICD-10-CM | POA: Diagnosis not present

## 2020-06-08 DIAGNOSIS — R1310 Dysphagia, unspecified: Secondary | ICD-10-CM | POA: Diagnosis not present

## 2020-06-08 DIAGNOSIS — R140 Abdominal distension (gaseous): Secondary | ICD-10-CM | POA: Diagnosis not present

## 2020-06-08 DIAGNOSIS — Z86010 Personal history of colonic polyps: Secondary | ICD-10-CM | POA: Diagnosis not present

## 2020-06-14 ENCOUNTER — Ambulatory Visit (INDEPENDENT_AMBULATORY_CARE_PROVIDER_SITE_OTHER): Payer: Medicare HMO | Admitting: Ophthalmology

## 2020-06-14 ENCOUNTER — Other Ambulatory Visit: Payer: Self-pay

## 2020-06-14 ENCOUNTER — Encounter (INDEPENDENT_AMBULATORY_CARE_PROVIDER_SITE_OTHER): Payer: Self-pay | Admitting: Ophthalmology

## 2020-06-14 DIAGNOSIS — H353211 Exudative age-related macular degeneration, right eye, with active choroidal neovascularization: Secondary | ICD-10-CM | POA: Diagnosis not present

## 2020-06-14 MED ORDER — BEVACIZUMAB CHEMO INJECTION 1.25MG/0.05ML SYRINGE FOR KALEIDOSCOPE
1.2500 mg | INTRAVITREAL | Status: AC | PRN
  Administered 2020-06-14: 1.25 mg via INTRAVITREAL

## 2020-06-14 NOTE — Assessment & Plan Note (Signed)
Much less active CNVM, as compared to December 2020.  We will repeat injection today at 7-week interval and extend exam interval to 8 weeks.  Excellent acuity preserved.

## 2020-06-14 NOTE — Progress Notes (Signed)
06/14/2020     CHIEF COMPLAINT Patient presents for Retina Follow Up   HISTORY OF PRESENT ILLNESS: Kevin Suarez is a 79 y.o. male who presents to the clinic today for:   HPI    Retina Follow Up    Patient presents with  Wet AMD.  In right eye.  Duration of 7 weeks.  Since onset it is stable.          Comments    7 week f.u - OCT OU, poss Avastin OD Patient denies change in vision and overall has no complaints.        Last edited by Gerda Diss on 06/14/2020  8:56 AM. (History)      Referring physician: Haywood Pao, MD [place],  [place] [id number]  HISTORICAL INFORMATION:   Selected notes from the MEDICAL RECORD NUMBER    Lab Results  Component Value Date   HGBA1C 6.8 (H) 10/21/2015     CURRENT MEDICATIONS: No current outpatient medications on file. (Ophthalmic Drugs)   No current facility-administered medications for this visit. (Ophthalmic Drugs)   Current Outpatient Medications (Other)  Medication Sig  . aspirin EC 81 MG tablet Take 81 mg by mouth daily.  . ciprofloxacin (CIPRO) 500 MG tablet Take 1 tablet (500 mg total) by mouth 2 (two) times daily.  Marland Kitchen HYDROcodone-acetaminophen (NORCO) 5-325 MG tablet Take 1 tablet by mouth every 6 (six) hours as needed.  Marland Kitchen losartan (COZAAR) 50 MG tablet Take 50 mg by mouth every morning.  . metFORMIN (GLUCOPHAGE) 500 MG tablet Take 500 mg by mouth 2 (two) times daily with a meal.   . Meth-Hyo-M Bl-Na Phos-Ph Sal (URIBEL) 118 MG CAPS Take 118 mg by mouth daily.  . NONFORMULARY OR COMPOUNDED ITEM [facility]:  [place] Tendonitis Cream - Diclofenac 3%, Doxepin 5%, Bupivacaine 1%, Gabapentin 6%, Ibuprofen 3%, Pentoxifylline 3%, apply 1-2 grams to both feet 3-4 times daily  . phenazopyridine (PYRIDIUM) 200 MG tablet Take 1 tablet (200 mg total) by mouth 3 (three) times daily as needed for pain.  . tamsulosin (FLOMAX) 0.4 MG CAPS capsule Take 0.4 mg by mouth at bedtime.   No current  facility-administered medications for this visit. (Other)      REVIEW OF SYSTEMS:    ALLERGIES Allergies  Allergen Reactions  . Prednisone Other (See Comments)    Doesn't like side effects of prednisone.     PAST MEDICAL HISTORY Past Medical History:  Diagnosis Date  . Bladder stone   . BPH (benign prostatic hypertrophy)   . Frequency of urination   . 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 History of kidney stones   . Hyperlipidemia   . Hypertension   . Lung nodules    LAST CT 2012  PER DR WRIGHT NOTE NO FURTHER WORK-UP (PULMOLOGIST)  . Nephrolithiasis    bilateral  . Type 2 diabetes mellitus ([place])   . Wears dentures    upper  . Wears glasses   . Wears partial dentures    Past Surgical History:  Procedure Laterality Date  . CATARACT EXTRACTION W/ INTRAOCULAR LENS  IMPLANT, BILATERAL    . CYSTOSCOPY WITH LITHOLAPAXY N/A 11/28/2013   Procedure: CYSTOSCOPY WITH stone retrieval;  Surgeon: Claybon Jabs, MD;  Location: [facility];  Service: Urology;  Laterality: N/A;  . CYSTOSCOPY WITH LITHOLAPAXY N/A 10/15/2015   Procedure: CYSTOSCOPY WITH LITHOLAPAXY, HOLMIUM LASER LITHOTRIPSY tranurethral excision of prostate;  Surgeon: [name], MD;  Location: [facility];  Service: Urology;  Laterality: N/A;  . EXTRACORPOREAL SHOCK WAVE LITHOTRIPSY Right 05-13-2015  . HOLMIUM LASER APPLICATION N/A 98/11/1912   Procedure: HOLMIUM LASER APPLICATION;  Surgeon: [name], MD;  Location: [facility];  Service: Urology;  Laterality: N/A;  . TOTAL HIP ARTHROPLASTY Bilateral 2000  &  2005  . TRANSURETHRAL RESECTION OF PROSTATE  11/28/2013   Procedure: TRANSURETHRAL RESECTION OF THE PROSTATE WITH  BUTTON GYRUS INSTRUMENTS;  Surgeon: Claybon Jabs, MD;  Location: [facility];  Service: Urology;;  . WRIST SURGERY Left 2005   INJURY    FAMILY HISTORY Family History  Problem Relation Age of Onset  . Cancer Mother        Unknown  .  Cancer Maternal Grandmother   . Heart attack Paternal Grandfather   . Coronary artery disease Father   . Diabetes Father     SOCIAL HISTORY Social History   Tobacco Use  . Smoking status: Former Smoker    Packs/day: 2.00    Years: 30.00    Pack years: 60.00    Types: Cigarettes    Quit date: 11/14/1995    Years since quitting: 24.6  . Smokeless tobacco: Never Used  Substance Use Topics  . Alcohol use: No  . Drug use: No         OPHTHALMIC EXAM:  Base Eye Exam    Visual Acuity (Snellen - Linear)      Right Left   Dist cc 20/20-1 20/20   Correction: Glasses       Tonometry (Tonopen, 9:02 AM)      Right Left   Pressure 12 9       Pupils      Pupils Dark Light Shape React APD   Right PERRL 4 3 Round Brisk None   Left PERRL 4 3 Round Brisk None       Visual Fields (Counting fingers)      Left Right    Full Full       Extraocular Movement      Right Left    Full Full       Neuro/Psych    Oriented x3: Yes   Mood/Affect: Normal       Dilation    Right eye: 1.0% Mydriacyl, 2.5% Phenylephrine @ 9:02 AM        Slit Lamp and Fundus Exam    External Exam      Right Left   External Normal Normal       Slit Lamp Exam      Right Left   Lids/Lashes Normal Normal   Conjunctiva/Sclera White and quiet White and quiet   Cornea Clear Clear   Anterior Chamber Deep and quiet Deep and quiet   Iris Round and reactive Round and reactive   Lens Posterior chamber intraocular lens Posterior chamber intraocular lens   Anterior Vitreous Normal Normal       Fundus Exam      Right Left   Posterior Vitreous , Posterior vitreous detachment    Disc Normal    C/D Ratio 0.55    Macula Retinal pigment epithelial atrophy, intermediate age related macular degeneration, no  hemorrhage, Mottling, Retinal pigment epithelial mottling    Vessels ,,, NO DR    Periphery Normal           IMAGING AND PROCEDURES  Imaging and Procedures for 06/14/20  OCT, Retina - OU -  Both Eyes       Right  Eye Quality was good. Scan locations included subfoveal. Central Foveal Thickness: 260. Findings include abnormal foveal contour, retinal drusen .   Left Eye Quality was good. Scan locations included subfoveal. Central Foveal Thickness: 272. Findings include retinal drusen , no SRF, no IRF.   Notes OD, much less subretinal fluid December 2020.  Currently at 7-week exam interval  Repeat intravitreal Avastin OD today and examination in 8 weeks       Intravitreal Injection, Pharmacologic Agent - OD - Right Eye       Time Out 06/14/2020. 10:09 AM. Confirmed correct patient, procedure, site, and patient consented.   Anesthesia Topical anesthesia was used. Anesthetic medications included Akten 3.5%.   Procedure Preparation included 5% betadine to ocular surface, 10% betadine to eyelids, Tobramycin 0.3%. A 30 gauge needle was used.   Injection:  1.25 mg Bevacizumab (AVASTIN) SOLN   NDC: [address]-2, Lot: [id number]   Route: Intravitreal, Site: Right Eye, Waste: 0 mg  Post-op Post injection exam found visual acuity of at least counting fingers. The patient tolerated the procedure well. There were no complications. The patient received written and verbal post procedure care education. Post injection medications were not given.                 ASSESSMENT/PLAN:  Exudative age-related macular degeneration of right eye with active choroidal neovascularization ([place]) Much less active CNVM, as compared to December 2020.  We will repeat injection today at 7-week interval and extend exam interval to 8 weeks.  Excellent acuity preserved.      ICD-10-CM   1. Exudative age-related macular degeneration of right eye with active choroidal neovascularization (HCC)  H35.3211 OCT, Retina - OU - Both Eyes    Intravitreal Injection, Pharmacologic Agent - OD - Right Eye    Bevacizumab (AVASTIN) SOLN 1.25 mg    1.OD, much less subretinal fluid December 2020.  Currently at  7-week exam interval  Repeat intravitreal Avastin OD today and examination in 8 weeks 2.  3.  Ophthalmic Meds Ordered this visit:  Meds ordered this encounter  Medications  . Bevacizumab (AVASTIN) SOLN 1.25 mg       Return in about 8 weeks (around 08/09/2020) for DILATE OU, AVASTIN OCT, OD.  There are no Patient Instructions on file for this visit.   Explained the diagnoses, plan, and follow up with the patient and they expressed understanding.  Patient expressed understanding of the importance of proper follow up care.   Clent Demark Yariana Hoaglund M.D. Diseases & Surgery of the Retina and Vitreous Retina [facility] Diabetic [place] 06/14/20     Abbreviations: M myopia (nearsighted); A astigmatism; H hyperopia (farsighted); P presbyopia; Mrx spectacle prescription;  CTL contact lenses; OD right eye; OS left eye; OU both eyes  XT exotropia; ET esotropia; PEK punctate epithelial keratitis; PEE punctate epithelial erosions; DES dry eye syndrome; MGD meibomian gland dysfunction; ATs artificial tears; PFAT's preservative free artificial tears; [place] nuclear sclerotic cataract; PSC posterior subcapsular cataract; ERM epi-retinal membrane; PVD posterior vitreous detachment; RD retinal detachment; DM diabetes mellitus; DR diabetic retinopathy; NPDR non-proliferative diabetic retinopathy; PDR proliferative diabetic retinopathy; CSME clinically significant macular edema; DME diabetic macular edema; dbh dot blot hemorrhages; CWS cotton wool spot; POAG primary open angle glaucoma; C/D cup-to-disc ratio; HVF humphrey visual field; GVF goldmann visual field; OCT optical coherence tomography; IOP intraocular pressure; BRVO Branch retinal vein occlusion; CRVO central retinal vein occlusion; CRAO central retinal artery occlusion; BRAO branch retinal artery occlusion; RT retinal tear; SB scleral buckle; PPV  pars plana vitrectomy; VH Vitreous hemorrhage; PRP panretinal laser photocoagulation; IVK intravitreal kenalog; VMT  vitreomacular traction; MH Macular hole;  NVD neovascularization of the disc; NVE neovascularization elsewhere; AREDS age related eye disease study; ARMD age related macular degeneration; POAG primary open angle glaucoma; EBMD epithelial/anterior basement membrane dystrophy; ACIOL anterior chamber intraocular lens; IOL intraocular lens; PCIOL posterior chamber intraocular lens; Phaco/IOL phacoemulsification with intraocular lens placement; [place] photorefractive keratectomy; LASIK laser assisted in situ keratomileusis; HTN hypertension; DM diabetes mellitus; COPD chronic obstructive pulmonary disease

## 2020-06-15 ENCOUNTER — Ambulatory Visit
Admission: RE | Admit: 2020-06-15 | Discharge: 2020-06-15 | Disposition: A | Discharge status: Home / Self Care | Payer: Medicare HMO | Source: Ambulatory Visit | Attending: Gastroenterology | Admitting: Gastroenterology

## 2020-06-15 DIAGNOSIS — K219 Gastro-esophageal reflux disease without esophagitis: Secondary | ICD-10-CM | POA: Diagnosis not present

## 2020-06-15 DIAGNOSIS — R1310 Dysphagia, unspecified: Secondary | ICD-10-CM

## 2020-07-01 DIAGNOSIS — Z23 Encounter for immunization: Secondary | ICD-10-CM | POA: Diagnosis not present

## 2020-07-01 DIAGNOSIS — R69 Illness, unspecified: Secondary | ICD-10-CM | POA: Diagnosis not present

## 2020-07-09 DIAGNOSIS — R1310 Dysphagia, unspecified: Secondary | ICD-10-CM | POA: Diagnosis not present

## 2020-07-09 DIAGNOSIS — K219 Gastro-esophageal reflux disease without esophagitis: Secondary | ICD-10-CM | POA: Diagnosis not present

## 2020-07-13 NOTE — Progress Notes (Signed)
Your procedure is scheduled on Tuesday, September 7th.  Report to [facility] Main Entrance "A" at 8:45 A.M., and check in at the Admitting office.  Call this number if you have problems the morning of surgery:  [phone]  Call [phone] if you have any questions prior to your surgery date Monday-Friday 8am-4pm   Remember:  Do not eat after midnight the night before your surgery  You may drink clear liquids until 7:45 A.M. the morning of your surgery.   Clear liquids allowed are: Water, Non-Citrus Juices (without pulp), Carbonated Beverages, Clear Tea, Black Coffee Only, and Gatorade.   Please complete your PRE-SURGERY G2 that was provided to you by 7:45 A.M. the morning of surgery.  Please, if able, drink it in one setting. DO NOT SIP.   Take these medicines the morning of surgery with A SIP OF WATER  finasteride (PROSCAR omeprazole (PRILOSEC)  IF NEEDED: Eye drops  Follow your surgeon's instructions on when to stop Aspirin.  If no instructions were given by your surgeon then you will need to call the office to get those instructions.    As of today, STOP taking Aleve, Naproxen, Ibuprofen, Motrin, Advil, Goody's, BC's, all herbal medications, fish oil, and all vitamins.          WHAT DO I DO ABOUT MY DIABETES MEDICATION?  ---The morning of surgery---  Do NOT take metFORMIN (GLUCOPHAGE).   HOW TO MANAGE YOUR DIABETES BEFORE AND AFTER SURGERY  Why is it important to control my blood sugar before and after surgery? . Improving blood sugar levels before and after surgery helps healing and can limit problems. . A way of improving blood sugar control is eating a healthy diet by: o  Eating less sugar and carbohydrates o  Increasing activity/exercise o  Talking with your doctor about reaching your blood sugar goals . High blood sugars (greater than 180 mg/dL) can raise your risk of infections and slow your recovery, so you will need to focus on controlling your diabetes during  the weeks before surgery. . Make sure that the doctor who takes care of your diabetes knows about your planned surgery including the date and location.  How do I manage my blood sugar before surgery? . Check your blood sugar at least 4 times a day, starting 2 days before surgery, to make sure that the level is not too high or low. . Check your blood sugar the morning of your surgery when you wake up and every 2 hours until you get to the Short Stay unit. o If your blood sugar is less than 70 mg/dL, you will need to treat for low blood sugar: - Do not take insulin. - Treat a low blood sugar (less than 70 mg/dL) with  cup of clear juice (cranberry or apple), 4 glucose tablets, OR glucose gel. - Recheck blood sugar in 15 minutes after treatment (to make sure it is greater than 70 mg/dL). If your blood sugar is not greater than 70 mg/dL on recheck, call [phone] for further instructions. . Report your blood sugar to the short stay nurse when you get to Short Stay.  . If you are admitted to the hospital after surgery: o Your blood sugar will be checked by the staff and you will probably be given insulin after surgery (instead of oral diabetes medicines) to make sure you have good blood sugar levels. o The goal for blood sugar control after surgery is 80-180 mg/dL.  Do not wear jewelry.            Do not wear lotions, powders,.colognes, or deodorant.            Men may shave face and neck.            Do not bring valuables to the hospital.            [facility] is not responsible for any belongings or valuables.  Do NOT Smoke (Tobacco/Vaping) or drink Alcohol 24 hours prior to your procedure If you use a CPAP at night, you may bring all equipment for your overnight stay.   Contacts, glasses, dentures or bridgework may not be worn into surgery.      For patients admitted to the hospital, discharge time will be determined by your treatment team.   Patients discharged the day of  surgery will not be allowed to drive home, and someone needs to stay with them for 24 hours.  Special instructions:   [facility]- Preparing For Surgery  Before surgery, you can play an important role. Because skin is not sterile, your skin needs to be as free of germs as possible. You can reduce the number of germs on your skin by washing with CHG (chlorahexidine gluconate) Soap before surgery.  CHG is an antiseptic cleaner which kills germs and bonds with the skin to continue killing germs even after washing.    Oral Hygiene is also important to reduce your risk of infection.  Remember - BRUSH YOUR TEETH THE MORNING OF SURGERY WITH YOUR REGULAR TOOTHPASTE  Please do not use if you have an allergy to CHG or antibacterial soaps. If your skin becomes reddened/irritated stop using the CHG.  Do not shave (including legs and underarms) for at least 48 hours prior to first CHG shower. It is OK to shave your face.  Please follow these instructions carefully.   1. Shower the NIGHT BEFORE SURGERY and the MORNING OF SURGERY with CHG Soap.   2. If you chose to wash your hair, wash your hair first as usual with your normal shampoo.  3. After you shampoo, rinse your hair and body thoroughly to remove the shampoo.  4. Use CHG as you would any other liquid soap. You can apply CHG directly to the skin and wash gently with a scrungie or a clean washcloth.   5. Apply the CHG Soap to your body ONLY FROM THE NECK DOWN.  Do not use on open wounds or open sores. Avoid contact with your eyes, ears, mouth and genitals (private parts). Wash Face and genitals (private parts)  with your normal soap.   6. Wash thoroughly, paying special attention to the area where your surgery will be performed.  7. Thoroughly rinse your body with warm water from the neck down.  8. DO NOT shower/wash with your normal soap after using and rinsing off the CHG Soap.  9. Pat yourself dry with a CLEAN TOWEL.  10. Wear CLEAN PAJAMAS  to bed the night before surgery  11. Place CLEAN SHEETS on your bed the night of your first shower and DO NOT SLEEP WITH PETS.  Day of Surgery: Wear Clean/Comfortable clothing the morning of surgery Do not apply any deodorants/lotions.   Remember to brush your teeth WITH YOUR REGULAR TOOTHPASTE.   Please read over the following fact sheets that you were given.

## 2020-07-14 ENCOUNTER — Encounter (HOSPITAL_COMMUNITY)
Admission: RE | Admit: 2020-07-14 | Discharge: 2020-07-14 | Disposition: A | Discharge status: Home / Self Care | Payer: Medicare HMO | Source: Ambulatory Visit | Attending: General Surgery | Admitting: General Surgery

## 2020-07-14 ENCOUNTER — Encounter (HOSPITAL_COMMUNITY): Payer: Self-pay

## 2020-07-14 ENCOUNTER — Other Ambulatory Visit: Payer: Self-pay

## 2020-07-14 DIAGNOSIS — Z01812 Encounter for preprocedural laboratory examination: Secondary | ICD-10-CM | POA: Diagnosis present

## 2020-07-14 HISTORY — DX: Unspecified osteoarthritis, unspecified site: M19.90

## 2020-07-14 LAB — HEMOGLOBIN A1C
Hgb A1c MFr Bld: 7 % — ABNORMAL HIGH (ref 4.8–5.6)
Mean Plasma Glucose: 154.2 mg/dL

## 2020-07-14 LAB — BASIC METABOLIC PANEL
Anion gap: 9 (ref 5–15)
BUN: 20 mg/dL (ref 8–23)
CO2: 22 mmol/L (ref 22–32)
Calcium: 9.7 mg/dL (ref 8.9–10.3)
Chloride: 106 mmol/L (ref 98–111)
Creatinine, Ser: 1.39 mg/dL — ABNORMAL HIGH (ref 0.61–1.24)
GFR calc Af Amer: 55 mL/min — ABNORMAL LOW (ref >60)
GFR calc non Af Amer: 48 mL/min — ABNORMAL LOW (ref >60)
Glucose, Bld: 146 mg/dL — ABNORMAL HIGH (ref 70–99)
Potassium: 4.8 mmol/L (ref 3.5–5.1)
Sodium: 137 mmol/L (ref 135–145)

## 2020-07-14 LAB — CBC
HCT: 39.2 % (ref 39.0–52.0)
Hemoglobin: 12.5 g/dL — ABNORMAL LOW (ref 13.0–17.0)
MCH: 28.2 pg (ref 26.0–34.0)
MCHC: 31.9 g/dL (ref 30.0–36.0)
MCV: 88.5 fL (ref 80.0–100.0)
Platelets: 234 10*3/uL (ref 150–400)
RBC: 4.43 MIL/uL (ref 4.22–5.81)
RDW: 13.1 % (ref 11.5–15.5)
WBC: 9 10*3/uL (ref 4.0–10.5)
nRBC: 0 % (ref 0.0–0.2)

## 2020-07-14 LAB — GLUCOSE, CAPILLARY: Glucose-Capillary: 190 mg/dL — ABNORMAL HIGH (ref 70–99)

## 2020-07-14 NOTE — Progress Notes (Signed)
PCP - [facility] Dr Osborne Casco Cardiologist - n/a  Chest x-ray - 08/02/19 (2V) EKG - 08/02/19 Stress Test - n/a ECHO - n/a Cardiac Cath - n/a  Fasting Blood Sugar - 130s Checks Blood Sugar  1 times a day  Aspirin Instructions:  ASA last dose was on 06/30/20 per patient.  ERAS:  Clears til 7:45 am DOS, G2 Low Sugar drink given.  Anesthesia review: Yes  Coronavirus Screening Covid test scheduled on 07/16/20. Do you have any of the following symptoms:  Cough yes/no: No Fever (>100.24F)  yes/no: No Runny nose yes/no: No Sore throat yes/no: No Difficulty breathing/shortness of breath  yes/no: No  Have you traveled in the last 14 days and where? yes/no: No  Patient verbalized understanding of instructions that were given to them at the PAT appointment.

## 2020-07-15 DIAGNOSIS — M47816 Spondylosis without myelopathy or radiculopathy, lumbar region: Secondary | ICD-10-CM | POA: Diagnosis not present

## 2020-07-15 DIAGNOSIS — M545 Low back pain: Secondary | ICD-10-CM | POA: Diagnosis not present

## 2020-07-16 ENCOUNTER — Other Ambulatory Visit (HOSPITAL_COMMUNITY)
Admission: RE | Admit: 2020-07-16 | Discharge: 2020-07-16 | Disposition: A | Discharge status: Home / Self Care | Payer: Medicare HMO | Source: Ambulatory Visit | Attending: General Surgery | Admitting: General Surgery

## 2020-07-16 DIAGNOSIS — Z01812 Encounter for preprocedural laboratory examination: Secondary | ICD-10-CM | POA: Diagnosis not present

## 2020-07-16 DIAGNOSIS — Z20822 Contact with and (suspected) exposure to covid-19: Secondary | ICD-10-CM | POA: Diagnosis not present

## 2020-07-16 LAB — SARS CORONAVIRUS 2 (TAT 6-24 HRS): SARS Coronavirus 2: NEGATIVE

## 2020-07-20 ENCOUNTER — Other Ambulatory Visit: Payer: Self-pay

## 2020-07-20 ENCOUNTER — Ambulatory Visit (HOSPITAL_COMMUNITY): Payer: Medicare HMO | Admitting: Physician Assistant

## 2020-07-20 ENCOUNTER — Ambulatory Visit (HOSPITAL_COMMUNITY): Payer: Medicare HMO | Admitting: Anesthesiology

## 2020-07-20 ENCOUNTER — Encounter (HOSPITAL_COMMUNITY): Payer: Self-pay | Admitting: General Surgery

## 2020-07-20 ENCOUNTER — Ambulatory Visit (HOSPITAL_COMMUNITY)
Admission: RE | Admit: 2020-07-20 | Discharge: 2020-07-20 | Disposition: A | Discharge status: Home / Self Care | Payer: Medicare HMO | Attending: General Surgery | Admitting: General Surgery

## 2020-07-20 ENCOUNTER — Encounter (HOSPITAL_COMMUNITY)
Admission: RE | Disposition: A | Discharge status: Home / Self Care | Payer: Self-pay | Source: Home / Self Care | Attending: General Surgery

## 2020-07-20 DIAGNOSIS — Z6833 Body mass index (BMI) 33.0-33.9, adult: Secondary | ICD-10-CM | POA: Insufficient documentation

## 2020-07-20 DIAGNOSIS — N400 Enlarged prostate without lower urinary tract symptoms: Secondary | ICD-10-CM | POA: Insufficient documentation

## 2020-07-20 DIAGNOSIS — E669 Obesity, unspecified: Secondary | ICD-10-CM | POA: Insufficient documentation

## 2020-07-20 DIAGNOSIS — E1122 Type 2 diabetes mellitus with diabetic chronic kidney disease: Secondary | ICD-10-CM | POA: Insufficient documentation

## 2020-07-20 DIAGNOSIS — N189 Chronic kidney disease, unspecified: Secondary | ICD-10-CM | POA: Diagnosis not present

## 2020-07-20 DIAGNOSIS — Z79899 Other long term (current) drug therapy: Secondary | ICD-10-CM | POA: Diagnosis not present

## 2020-07-20 DIAGNOSIS — M549 Dorsalgia, unspecified: Secondary | ICD-10-CM | POA: Insufficient documentation

## 2020-07-20 DIAGNOSIS — K219 Gastro-esophageal reflux disease without esophagitis: Secondary | ICD-10-CM | POA: Insufficient documentation

## 2020-07-20 DIAGNOSIS — K4090 Unilateral inguinal hernia, without obstruction or gangrene, not specified as recurrent: Secondary | ICD-10-CM | POA: Diagnosis not present

## 2020-07-20 DIAGNOSIS — Z7984 Long term (current) use of oral hypoglycemic drugs: Secondary | ICD-10-CM | POA: Diagnosis not present

## 2020-07-20 DIAGNOSIS — I129 Hypertensive chronic kidney disease with stage 1 through stage 4 chronic kidney disease, or unspecified chronic kidney disease: Secondary | ICD-10-CM | POA: Diagnosis not present

## 2020-07-20 DIAGNOSIS — R300 Dysuria: Secondary | ICD-10-CM | POA: Diagnosis not present

## 2020-07-20 DIAGNOSIS — I10 Essential (primary) hypertension: Secondary | ICD-10-CM | POA: Diagnosis not present

## 2020-07-20 DIAGNOSIS — E785 Hyperlipidemia, unspecified: Secondary | ICD-10-CM | POA: Diagnosis not present

## 2020-07-20 DIAGNOSIS — Z7982 Long term (current) use of aspirin: Secondary | ICD-10-CM | POA: Insufficient documentation

## 2020-07-20 DIAGNOSIS — M1990 Unspecified osteoarthritis, unspecified site: Secondary | ICD-10-CM | POA: Insufficient documentation

## 2020-07-20 DIAGNOSIS — E119 Type 2 diabetes mellitus without complications: Secondary | ICD-10-CM | POA: Diagnosis not present

## 2020-07-20 DIAGNOSIS — Z87891 Personal history of nicotine dependence: Secondary | ICD-10-CM | POA: Diagnosis not present

## 2020-07-20 HISTORY — PX: XI ROBOTIC ASSISTED INGUINAL HERNIA REPAIR WITH MESH: SHX6706

## 2020-07-20 LAB — GLUCOSE, CAPILLARY
Glucose-Capillary: 103 mg/dL — ABNORMAL HIGH (ref 70–99)
Glucose-Capillary: 142 mg/dL — ABNORMAL HIGH (ref 70–99)

## 2020-07-20 SURGERY — REPAIR, HERNIA, INGUINAL, ROBOT-ASSISTED, LAPAROSCOPIC, USING MESH
Anesthesia: General | Laterality: Left

## 2020-07-20 MED ORDER — CHLORHEXIDINE GLUCONATE 0.12 % MT SOLN
OROMUCOSAL | Status: AC
  Administered 2020-07-20: 15 mL
  Filled 2020-07-20: qty 15

## 2020-07-20 MED ORDER — LIDOCAINE 2% (20 MG/ML) 5 ML SYRINGE
INTRAMUSCULAR | Status: DC | PRN
  Administered 2020-07-20: 40 mg via INTRAVENOUS

## 2020-07-20 MED ORDER — CHLORHEXIDINE GLUCONATE CLOTH 2 % EX PADS: 6.0000 | MEDICATED_PAD | Freq: Once | CUTANEOUS | Status: DC

## 2020-07-20 MED ORDER — BUPIVACAINE HCL (PF) 0.25 % IJ SOLN
INTRAMUSCULAR | Status: AC
  Filled 2020-07-20: qty 30

## 2020-07-20 MED ORDER — ONDANSETRON HCL 4 MG/2ML IJ SOLN: 4.0000 mg | Freq: Once | INTRAMUSCULAR | Status: DC | PRN

## 2020-07-20 MED ORDER — ACETAMINOPHEN 500 MG PO TABS: 1000.0000 mg | ORAL_TABLET | ORAL | Status: AC

## 2020-07-20 MED ORDER — LACTATED RINGERS IV SOLN: INTRAVENOUS | Status: DC | PRN

## 2020-07-20 MED ORDER — 0.9 % SODIUM CHLORIDE (POUR BTL) OPTIME
TOPICAL | Status: DC | PRN
  Administered 2020-07-20: 1000 mL

## 2020-07-20 MED ORDER — SUGAMMADEX SODIUM 200 MG/2ML IV SOLN
INTRAVENOUS | Status: DC | PRN
  Administered 2020-07-20: 200 mg via INTRAVENOUS

## 2020-07-20 MED ORDER — ONDANSETRON HCL 4 MG/2ML IJ SOLN
INTRAMUSCULAR | Status: DC | PRN
  Administered 2020-07-20: 4 mg via INTRAVENOUS

## 2020-07-20 MED ORDER — ENSURE PRE-SURGERY PO LIQD: 296.0000 mL | Freq: Once | ORAL | Status: DC

## 2020-07-20 MED ORDER — DEXAMETHASONE SODIUM PHOSPHATE 10 MG/ML IJ SOLN
INTRAMUSCULAR | Status: AC
  Filled 2020-07-20: qty 1

## 2020-07-20 MED ORDER — OXYCODONE HCL 5 MG/5ML PO SOLN: 5.0000 mg | Freq: Once | ORAL | Status: AC | PRN

## 2020-07-20 MED ORDER — TRAMADOL HCL 50 MG PO TABS: 50.0000 mg | ORAL_TABLET | Freq: Four times a day (QID) | ORAL | 0 refills | Status: AC | PRN

## 2020-07-20 MED ORDER — FENTANYL CITRATE (PF) 250 MCG/5ML IJ SOLN
INTRAMUSCULAR | Status: AC
Start: 2020-07-20 — End: ?
  Filled 2020-07-20: qty 5

## 2020-07-20 MED ORDER — HYDROMORPHONE HCL 1 MG/ML IJ SOLN
INTRAMUSCULAR | Status: AC
  Filled 2020-07-20: qty 1

## 2020-07-20 MED ORDER — NORMAL SALINE FLUSH 0.9 % IV SOLN
INTRAVENOUS | Status: DC | PRN
  Administered 2020-07-20: 20 mL

## 2020-07-20 MED ORDER — OXYMETAZOLINE HCL 0.05 % NA SOLN
NASAL | Status: AC
  Filled 2020-07-20: qty 30

## 2020-07-20 MED ORDER — PROPOFOL 10 MG/ML IV BOLUS
INTRAVENOUS | Status: AC
  Filled 2020-07-20: qty 20

## 2020-07-20 MED ORDER — FENTANYL CITRATE (PF) 250 MCG/5ML IJ SOLN
INTRAMUSCULAR | Status: DC | PRN
Start: 2020-07-20 — End: 2020-07-20
  Administered 2020-07-20 (×2): 50 ug via INTRAVENOUS

## 2020-07-20 MED ORDER — CEFAZOLIN SODIUM-DEXTROSE 2-4 GM/100ML-% IV SOLN
INTRAVENOUS | Status: AC
  Filled 2020-07-20: qty 100

## 2020-07-20 MED ORDER — ROCURONIUM BROMIDE 10 MG/ML (PF) SYRINGE
PREFILLED_SYRINGE | INTRAVENOUS | Status: DC | PRN
  Administered 2020-07-20: 60 mg via INTRAVENOUS

## 2020-07-20 MED ORDER — PROPOFOL 10 MG/ML IV BOLUS
INTRAVENOUS | Status: DC | PRN
  Administered 2020-07-20: 130 mg via INTRAVENOUS
  Administered 2020-07-20: 20 mg via INTRAVENOUS

## 2020-07-20 MED ORDER — DEXAMETHASONE SODIUM PHOSPHATE 10 MG/ML IJ SOLN
INTRAMUSCULAR | Status: DC | PRN
  Administered 2020-07-20: 5 mg via INTRAVENOUS

## 2020-07-20 MED ORDER — OXYCODONE HCL 5 MG PO TABS
5.0000 mg | ORAL_TABLET | Freq: Once | ORAL | Status: AC | PRN
  Administered 2020-07-20: 5 mg via ORAL

## 2020-07-20 MED ORDER — ACETAMINOPHEN 500 MG PO TABS
ORAL_TABLET | ORAL | Status: AC
  Administered 2020-07-20: 1000 mg via ORAL
  Filled 2020-07-20: qty 2

## 2020-07-20 MED ORDER — BUPIVACAINE-EPINEPHRINE 0.25% -1:200000 IJ SOLN
INTRAMUSCULAR | Status: DC | PRN
  Administered 2020-07-20: 6 mL

## 2020-07-20 MED ORDER — HYDROMORPHONE HCL 1 MG/ML IJ SOLN
0.2500 mg | INTRAMUSCULAR | Status: DC | PRN
  Administered 2020-07-20: 0.25 mg via INTRAVENOUS

## 2020-07-20 MED ORDER — ONDANSETRON HCL 4 MG/2ML IJ SOLN
INTRAMUSCULAR | Status: AC
  Filled 2020-07-20: qty 2

## 2020-07-20 MED ORDER — BUPIVACAINE LIPOSOME 1.3 % IJ SUSP
INTRAMUSCULAR | Status: DC | PRN
  Administered 2020-07-20: 20 mL

## 2020-07-20 MED ORDER — OXYCODONE HCL 5 MG PO TABS
ORAL_TABLET | ORAL | Status: DC
Start: 2020-07-20 — End: 2020-07-20
  Filled 2020-07-20: qty 1

## 2020-07-20 MED ORDER — CEFAZOLIN SODIUM-DEXTROSE 2-4 GM/100ML-% IV SOLN
2.0000 g | INTRAVENOUS | Status: AC
  Administered 2020-07-20: 2 g via INTRAVENOUS

## 2020-07-20 SURGICAL SUPPLY — 63 items
ADH SKN CLS APL DERMABOND .7 (GAUZE/BANDAGES/DRESSINGS) ×1
CANNULA REDUC XI 12-8 STAPL (CANNULA) ×2
CANNULA REDUCER 12-8 DVNC XI (CANNULA) ×1 IMPLANT
CHLORAPREP W/TINT 26 (MISCELLANEOUS) ×2 IMPLANT
COVER SURGICAL LIGHT HANDLE (MISCELLANEOUS) ×2 IMPLANT
COVER TIP SHEARS 8 DVNC (MISCELLANEOUS) ×1 IMPLANT
COVER TIP SHEARS 8MM DA VINCI (MISCELLANEOUS) ×2
DECANTER SPIKE VIAL GLASS SM (MISCELLANEOUS) IMPLANT
DEFOGGER SCOPE WARMER CLEARIFY (MISCELLANEOUS) ×2 IMPLANT
DERMABOND ADVANCED (GAUZE/BANDAGES/DRESSINGS) ×1
DERMABOND ADVANCED .7 DNX12 (GAUZE/BANDAGES/DRESSINGS) ×1 IMPLANT
DEVICE TROCAR PUNCTURE CLOSURE (ENDOMECHANICALS) ×2 IMPLANT
DRAPE ARM DVNC X/XI (DISPOSABLE) ×4 IMPLANT
DRAPE COLUMN DVNC XI (DISPOSABLE) ×1 IMPLANT
DRAPE CV SPLIT W-CLR ANES SCRN (DRAPES) ×2 IMPLANT
DRAPE DA VINCI XI ARM (DISPOSABLE) ×8
DRAPE DA VINCI XI COLUMN (DISPOSABLE) ×2
DRAPE ORTHO SPLIT 77X108 STRL (DRAPES) ×2
DRAPE SURG ORHT 6 SPLT 77X108 (DRAPES) ×1 IMPLANT
ELECT REM PT RETURN 9FT ADLT (ELECTROSURGICAL) ×2
ELECTRODE REM PT RTRN 9FT ADLT (ELECTROSURGICAL) ×1 IMPLANT
GLOVE BIO SURGEON STRL SZ 6 (GLOVE) ×4 IMPLANT
GLOVE BIO SURGEON STRL SZ7.5 (GLOVE) ×4 IMPLANT
GLOVE BIOGEL PI IND STRL 6.5 (GLOVE) ×4 IMPLANT
GLOVE BIOGEL PI INDICATOR 6.5 (GLOVE) ×4
GLOVE SURG SS PI 6.5 STRL IVOR (GLOVE) ×2 IMPLANT
GOWN STRL REUS W/ TWL LRG LVL3 (GOWN DISPOSABLE) ×3 IMPLANT
GOWN STRL REUS W/ TWL XL LVL3 (GOWN DISPOSABLE) ×2 IMPLANT
GOWN STRL REUS W/TWL 2XL LVL3 (GOWN DISPOSABLE) ×2 IMPLANT
GOWN STRL REUS W/TWL LRG LVL3 (GOWN DISPOSABLE) ×6
GOWN STRL REUS W/TWL XL LVL3 (GOWN DISPOSABLE) ×4
KIT BASIN OR (CUSTOM PROCEDURE TRAY) ×2 IMPLANT
KIT TURNOVER KIT B (KITS) ×2 IMPLANT
MARKER SKIN DUAL TIP RULER LAB (MISCELLANEOUS) ×2 IMPLANT
MESH PROGRIP LAP SELF FIXATING (Mesh General) ×2 IMPLANT
MESH PROGRIP LAP SLF FIX 16X12 (Mesh General) ×1 IMPLANT
NEEDLE HYPO 22GX1.5 SAFETY (NEEDLE) ×2 IMPLANT
NEEDLE INSUFFLATION 14GA 120MM (NEEDLE) ×2 IMPLANT
OBTURATOR OPTICAL STANDARD 8MM (TROCAR)
OBTURATOR OPTICAL STND 8 DVNC (TROCAR)
OBTURATOR OPTICALSTD 8 DVNC (TROCAR) IMPLANT
PAD ARMBOARD 7.5X6 YLW CONV (MISCELLANEOUS) ×4 IMPLANT
PENCIL SMOKE EVACUATOR (MISCELLANEOUS) IMPLANT
SCISSORS LAP 5X35 DISP (ENDOMECHANICALS) IMPLANT
SEAL CANN UNIV 5-8 DVNC XI (MISCELLANEOUS) ×2 IMPLANT
SEAL XI 5MM-8MM UNIVERSAL (MISCELLANEOUS) ×4
SET IRRIG TUBING LAPAROSCOPIC (IRRIGATION / IRRIGATOR) IMPLANT
SET TUBE SMOKE EVAC HIGH FLOW (TUBING) ×2 IMPLANT
STAPLER CANNULA SEAL DVNC XI (STAPLE) ×1 IMPLANT
STAPLER CANNULA SEAL XI (STAPLE) ×2
STOPCOCK 4 WAY LG BORE MALE ST (IV SETS) ×2 IMPLANT
SUT MNCRL AB 4-0 PS2 18 (SUTURE) ×2 IMPLANT
SUT VIC AB 2-0 SH 27 (SUTURE) ×2
SUT VIC AB 2-0 SH 27X BRD (SUTURE) ×1 IMPLANT
SUT VICRYL 0 UR6 27IN ABS (SUTURE) ×2 IMPLANT
SUT VLOC 180 2-0 9IN GS21 (SUTURE) ×2 IMPLANT
SYR 30ML SLIP (SYRINGE) ×2 IMPLANT
SYR TOOMEY 50ML (SYRINGE) ×2 IMPLANT
TOWEL GREEN STERILE FF (TOWEL DISPOSABLE) ×2 IMPLANT
TRAY FOLEY MTR SLVR 14FR STAT (SET/KITS/TRAYS/PACK) ×2 IMPLANT
TRAY FOLEY MTR SLVR 16FR STAT (SET/KITS/TRAYS/PACK) IMPLANT
TRAY LAPAROSCOPIC MC (CUSTOM PROCEDURE TRAY) ×2 IMPLANT
TROCAR XCEL NON-BLD 5MMX100MML (ENDOMECHANICALS) IMPLANT

## 2020-07-20 NOTE — Discharge Instructions (Signed)
[facility] _______[facility] °INGUINAL HERNIA REPAIR: POST OP INSTRUCTIONS ° °Always review your discharge instruction sheet given to you by the facility where your surgery was performed. °IF YOU HAVE DISABILITY OR FAMILY LEAVE FORMS, YOU MUST BRING THEM TO THE OFFICE FOR PROCESSING.   °DO NOT GIVE THEM TO YOUR DOCTOR. ° °1. A  prescription for pain medication may be given to you upon discharge.  Take your pain medication as prescribed, if needed.  If narcotic pain medicine is not needed, then you may take acetaminophen (Tylenol) or ibuprofen (Advil) as needed. °2. Take your usually prescribed medications unless otherwise directed. °If you need a refill on your pain medication, please contact your pharmacy.  They will contact our office to request authorization. Prescriptions will not be filled after 5 pm or on week-ends. °3. You should follow a light diet the first 24 hours after arrival home, such as soup and crackers, etc.  Be sure to include lots of fluids daily.  Resume your normal diet the day after surgery. °4.Most patients will experience some swelling and bruising around the umbilicus or in the groin and scrotum.  Ice packs and reclining will help.  Swelling and bruising can take several days to resolve.  °6. It is common to experience some constipation if taking pain medication after surgery.  Increasing fluid intake and taking a stool softener (such as Colace) will usually help or prevent this problem from occurring.  A mild laxative (Milk of Magnesia or Miralax) should be taken according to package directions if there are no bowel movements after 48 hours. °7. Unless discharge instructions indicate otherwise, you may remove your bandages 24-48 hours after surgery, and you may shower at that time.  You may have steri-strips (small skin tapes) in place directly over the incision.  These strips should be left on the skin for 7-10 days.  If your surgeon used skin glue on the incision, you may  shower in 24 hours.  The glue will flake off over the next 2-3 weeks.  Any sutures or staples will be removed at the office during your follow-up visit. °8. ACTIVITIES:  You may resume regular (light) daily activities beginning the next day--such as daily self-care, walking, climbing stairs--gradually increasing activities as tolerated.  You may have sexual intercourse when it is comfortable.  Refrain from any heavy lifting or straining until approved by your doctor. ° °a.You may drive when you are no longer taking prescription pain medication, you can comfortably wear a seatbelt, and you can safely maneuver your car and apply brakes. °b.RETURN TO WORK:   °_____________________________________________ ° °9.You should see your doctor in the office for a follow-up appointment approximately 2-3 weeks after your surgery.  Make sure that you call for this appointment within a day or two after you arrive home to insure a convenient appointment time. °10.OTHER INSTRUCTIONS: _________________________ °   _____________________________________ ° °WHEN TO CALL YOUR DOCTOR: °1. Fever over 101.0 °2. Inability to urinate °3. Nausea and/or vomiting °4. Extreme swelling or bruising °5. Continued bleeding from incision. °6. Increased pain, redness, or drainage from the incision ° °The clinic staff is available to answer your questions during regular business hours.  Please don’t hesitate to call and ask to speak to one of the nurses for clinical concerns.  If you have a medical emergency, go to the nearest emergency room or call 911.  A surgeon from [facility] is always on call at the hospital ° ° °[address], [address], [place], [place]  [address] ? ° [address] [address], [place], [place]   [address] °[phone] ? [phone] ? FAX [phone] °Web site: [email or web address] ° °

## 2020-07-20 NOTE — Anesthesia Procedure Notes (Signed)
Procedure Name: Intubation Performed by: Glynda Jaeger, CRNA Pre-anesthesia Checklist: Patient identified, Patient being monitored, Timeout performed, Emergency Drugs available and Suction available Patient Re-evaluated:Patient Re-evaluated prior to induction Oxygen Delivery Method: Circle System Utilized Preoxygenation: Pre-oxygenation with 100% oxygen Induction Type: IV induction Ventilation: Oral airway inserted - appropriate to patient size Laryngoscope Size: Mac and 3 Grade View: Grade I Tube type: Oral Tube size: 7.5 mm Number of attempts: 1 Airway Equipment and Method: Stylet Placement Confirmation: ETT inserted through vocal cords under direct vision,  positive ETCO2 and breath sounds checked- equal and bilateral Secured at: 21 cm Tube secured with: Tape Dental Injury: Teeth and Oropharynx as per pre-operative assessment

## 2020-07-20 NOTE — Anesthesia Preprocedure Evaluation (Addendum)
Anesthesia Evaluation  Patient identified by MRN, date of birth, ID band Patient awake    Reviewed: Allergy & Precautions, NPO status , Patient's Chart, lab work & pertinent test results  Airway Mallampati: II  TM Distance: >3 FB Neck ROM: Full    Dental  (+) Edentulous Upper, Partial Lower,    Pulmonary former smoker,  Former smoker, 60 pack year history, quit 1997 No inhalers   Pulmonary exam normal breath sounds clear to auscultation       Cardiovascular hypertension, Pt. on medications Normal cardiovascular exam Rhythm:Regular Rate:Normal     Neuro/Psych negative neurological ROS  negative psych ROS   GI/Hepatic Neg liver ROS, GERD  Medicated and Controlled,  Endo/Other  diabetes, Well Controlled, Type 2, Oral Hypoglycemic AgentsObesity BMI 33 a1c 7.0  Renal/GU Renal InsufficiencyRenal diseaseCr 1.39  negative genitourinary   Musculoskeletal  (+) Arthritis , Osteoarthritis,    Abdominal (+) + obese,   Peds  Hematology negative hematology ROS (+) hct 39.2, plt  234   Anesthesia Other Findings   Reproductive/Obstetrics negative OB ROS                           Anesthesia Physical Anesthesia Plan  ASA: III  Anesthesia Plan: General   Post-op Pain Management:    Induction: Intravenous  PONV Risk Score and Plan: 3 and Ondansetron, Dexamethasone and Treatment may vary due to age or medical condition  Airway Management Planned: Oral ETT  Additional Equipment: None  Intra-op Plan:   Post-operative Plan: Extubation in OR  Informed Consent: I have reviewed the patients History and Physical, chart, labs and discussed the procedure including the risks, benefits and alternatives for the proposed anesthesia with the patient or authorized representative who has indicated his/her understanding and acceptance.     Dental advisory given  Plan Discussed with: CRNA  Anesthesia Plan  Comments:        Anesthesia Quick Evaluation

## 2020-07-20 NOTE — H&P (Signed)
  History of Present Illness The patient is a 79 year old male who presents with an inguinal hernia. Patient follow back up today secondary to left inguinal hernia. Patient was seen here approximately 1 year ago. He states that since that time the hernia has increased in size. He states that he has more discomfort and pain. Patient continued to have back pain. He is currently to undergo injection secondary to back pain.  Patient states he continues with dysuria.  Patient's had no previous abdominal surgery. ----------------------------------------------------------------------  Referred by: Dr. [name] Chief Complaint: Left inguinal hernia  Patient is a 79 year old male with a history of hypertension, diabetes, chronic kidney disease, who comes in with a left inguinal hernia. Patient states his been there for approximately 2 months. He states that he has noticed both the left inguinal area. He states she's been using a truss. He states this has helped minimally. Patient states he is not very active secondary to back pain. Patient states he only notices a hernia when he has a bowel movement. Patient had no signs or symptoms of incarceration or strangulation.  Patient's had no previous abdominal surgery.    Allergies  No Known Drug Allergies  [03/28/2019]: Allergies Reconciled   Medication History  Ciprofloxacin HCl (250MG  Tablet, Oral) Active. Clobetasol Propionate (0.05% Cream, External) Active. Diclofenac Sodium (75MG  Tablet DR, Oral) Active. Finasteride (5MG  Tablet, Oral) Active. Losartan Potassium (50MG  Tablet, Oral) Active. metFORMIN HCl (500MG  Tablet, Oral) Active. Tamsulosin HCl (0.4MG  Capsule, Oral) Active. tiZANidine HCl (2MG  Tablet, Oral) Active. OneTouch Verio (In Vitro) Active. Medications Reconciled    Review of Systems All other systems negative  BP (!) 144/57   Pulse 75   Temp 97.6 F (36.4 C) (Temporal)   Resp 20   Ht  5\' 10"  (1.778 m)   Wt 105.3 kg   SpO2 99%   BMI 33.30 kg/m     Physical Exam  The physical exam findings are as follows: Note: Constitutional: No acute distress, conversant, appears stated age  Eyes: Anicteric sclerae, moist conjunctiva, no lid lag  Neck: No thyromegaly, trachea midline, no cervical lymphadenopathy  Lungs: Clear to auscultation biilaterally, normal respiratory effot  Cardiovascular: regular rate & rhythm, no murmurs, no peripheal edema, pedal pulses 2+  GI: Soft, no masses or hepatosplenomegaly, non-tender to palpation  MSK: Normal gait, no clubbing cyanosis, edema  Skin: No rashes, palpation reveals normal skin turgor  Psychiatric: Appropriate judgment and insight, oriented to person, place, and time  Abdomen Inspection Hernias - Inguinal hernia - Left - Reducible - Left (Large) .    Assessment & Plan LEFT INGUINAL HERNIA (K40.90) Impression: 79 year old male with a history of hypertension, diabetes, chronic kidney disease, in the left inguinal hernia.  1. The patient will like to proceed to the operating room for robotic left inguinal hernia repair with mesh.  2. I discussed with the patient the signs and symptoms of incarceration and strangulation and the need to proceed to the ER should they occur.  3. I discussed with the patient the risks and benefits of the procedure to include but not limited to: Infection, bleeding, damage to surrounding structures.. Patient voiced understanding and agreed to proceed.

## 2020-07-20 NOTE — Anesthesia Postprocedure Evaluation (Signed)
Anesthesia Post Note  Patient: Kevin Suarez  Procedure(s) Performed: ROBOTIC LEFT INGUINAL HERNIA WITH MESH USING PROGRIP LAPAROSCOPIC SELF-FIXATING MESH 16cm x 12cm (Left )     Patient location during evaluation: PACU Anesthesia Type: General Level of consciousness: awake and alert, oriented and patient cooperative Pain management: pain level controlled Vital Signs Assessment: post-procedure vital signs reviewed and stable Respiratory status: spontaneous breathing, nonlabored ventilation and respiratory function stable Cardiovascular status: blood pressure returned to baseline and stable Postop Assessment: no apparent nausea or vomiting Anesthetic complications: no   No complications documented.  Last Vitals:  Vitals:   07/20/20 1300 07/20/20 1302  BP:  (!) 144/93  Pulse: 66 64  Resp: 20 (!) 24  Temp:  36.5 C  SpO2: 97% 95%    Last Pain:  Vitals:   07/20/20 1302  TempSrc:   PainSc: [place]

## 2020-07-20 NOTE — Transfer of Care (Signed)
Immediate Anesthesia Transfer of Care Note  Patient: Kevin Suarez  Procedure(s) Performed: ROBOTIC LEFT INGUINAL HERNIA WITH MESH USING PROGRIP LAPAROSCOPIC SELF-FIXATING MESH 16cm x 12cm (Left )  Patient Location: PACU  Anesthesia Type:General  Level of Consciousness: awake, alert , oriented and responds to stimulation  Airway & Oxygen Therapy: Patient Spontanous Breathing and Patient connected to face mask oxygen  Post-op Assessment: Report given to RN and Post -op Vital signs reviewed and stable  Post vital signs: Reviewed and stable  Last Vitals:  Vitals Value Taken Time  BP 156/82 07/20/20 1238  Temp 36.5 C 07/20/20 1238  Pulse 75 07/20/20 1242  Resp 17 07/20/20 1242  SpO2 97 % 07/20/20 1242  Vitals shown include unvalidated device data.  Last Pain:  Vitals:   07/20/20 1238  TempSrc:   PainSc: 7          Complications: No complications documented.

## 2020-07-20 NOTE — Op Note (Signed)
07/20/2020  12:29 PM  PATIENT:  Kevin Suarez  79 y.o. male  PRE-OPERATIVE DIAGNOSIS:  LEFT INGUINAL HERNIA  POST-OPERATIVE DIAGNOSIS:  LEFT INGUINAL HERNIA  PROCEDURE:  Procedure(s): ROBOTIC LEFT INGUINAL HERNIA WITH MESH USING PROGRIP LAPAROSCOPIC SELF-FIXATING MESH 16cm x 12cm (Left)  SURGEON:  Surgeon(s) and Role:    Ralene Ok, MD - Primary  ANESTHESIA:   local and general  EBL:  minimal   BLOOD ADMINISTERED:none  DRAINS: none   LOCAL MEDICATIONS USED:  BUPIVICAINE   SPECIMEN:  No Specimen  DISPOSITION OF SPECIMEN:  N/A  COUNTS:  YES  TOURNIQUET:  * No tourniquets in log *  DICTATION: .Dragon Dictation Details of the procedure:   The patient was taken back to the operating room. The patient was placed in supine position with bilateral SCDs in place.  A Foley catheter was placed.  The patient was prepped and draped in the usual sterile fashion.  After appropriate anitbiotics were confirmed, a time-out was confirmed and all facts were verified.  At this time a Veress needle technique was used to inspect the abdomen approximately 10 cm from the valgus and the paramedian line. This time a 8 mm robotic trocar was placed into the abdomen. The camera was placed there was no injury to any intra-abdominal organs. A 61mm umbilical port was placed just superior to the umbilicus. An 8 mm port was placed approximately 10 cm lateral to the umbilicus on the left paramedian side. At this time the left-sided Progrip XL mesh was placed in the abdomen as were 3-0 Vicryl's 2 and a 2-0 V-Lock.  At this time the hernia contained mainly sigmoid colon.  Robot was positioned over the patient and the ports were docked in the usual fashion.  At this time the left-sided peritoneum was taken down from the medial umbilical ligament laterally. The pre-peritoneal space was entered. Dissection was taken down to Cooper's ligament.  The hernia contained mainly sigmoid colon. At this time I  proceeded to clean off the rest of Cooper's ligament and the medial to lateral direction. A proceeded laterally to dissect the spermatic cord. The spermatic cord was circumferentially dissected away from the surrounding musculature and tissue. The vas deferens was identified and protected all portions of the case.  The hernia sac consisted mainly of the colon and fatty tissue. This was dissected back. At this time I proceeded to create a pocket laterally for the mesh. Once the pocket was created the peritoneum was stripped back to approximately the base of the cord. At this time the piece of Progrip mesh was in placed into the dissected area. This covered both the direct and indirect spaces appropriately. This also covered  The femoral space. At this time the 3-0 Vicryl stitches were then used to tack theMesh to the Cooper's ligament 1 medially. The mesh lay flat from medial to lateral. At this time a 2-0 V- lock stitch was used to close the peritoneum in a standard running fashion.  At this time the robot was undocked. The umbilical port site was reapproximated using a 0 Vicryl via an Endo Close device 1. All ports were removed. The skin was reapproximated and all port sites using 4-0 Monocryl subcuticular fashion.  The patient the procedure well was taken to the recovery     PLAN OF CARE: Discharge to home after PACU  PATIENT DISPOSITION:  PACU - hemodynamically stable.   Delay start of Pharmacological VTE agent (>24hrs) due to surgical blood loss or risk  of bleeding: not applicable

## 2020-07-21 ENCOUNTER — Encounter (HOSPITAL_COMMUNITY): Payer: Self-pay | Admitting: General Surgery

## 2020-07-22 MED FILL — Bupivacaine Liposome Inj 1.3% (13.3 MG/ML): INTRAMUSCULAR | Qty: 20 | Status: AC

## 2020-07-26 ENCOUNTER — Encounter (HOSPITAL_COMMUNITY): Payer: Self-pay | Admitting: General Surgery

## 2020-07-27 DIAGNOSIS — N401 Enlarged prostate with lower urinary tract symptoms: Secondary | ICD-10-CM | POA: Diagnosis not present

## 2020-07-27 DIAGNOSIS — R809 Proteinuria, unspecified: Secondary | ICD-10-CM | POA: Diagnosis not present

## 2020-07-27 DIAGNOSIS — E1129 Type 2 diabetes mellitus with other diabetic kidney complication: Secondary | ICD-10-CM | POA: Diagnosis not present

## 2020-07-27 DIAGNOSIS — E669 Obesity, unspecified: Secondary | ICD-10-CM | POA: Diagnosis not present

## 2020-07-27 DIAGNOSIS — Z6834 Body mass index (BMI) 34.0-34.9, adult: Secondary | ICD-10-CM | POA: Diagnosis not present

## 2020-07-27 DIAGNOSIS — Z23 Encounter for immunization: Secondary | ICD-10-CM | POA: Diagnosis not present

## 2020-07-27 DIAGNOSIS — I129 Hypertensive chronic kidney disease with stage 1 through stage 4 chronic kidney disease, or unspecified chronic kidney disease: Secondary | ICD-10-CM | POA: Diagnosis not present

## 2020-07-27 DIAGNOSIS — E538 Deficiency of other specified B group vitamins: Secondary | ICD-10-CM | POA: Diagnosis not present

## 2020-07-27 DIAGNOSIS — N182 Chronic kidney disease, stage 2 (mild): Secondary | ICD-10-CM | POA: Diagnosis not present

## 2020-08-09 ENCOUNTER — Other Ambulatory Visit: Payer: Self-pay

## 2020-08-09 ENCOUNTER — Encounter (INDEPENDENT_AMBULATORY_CARE_PROVIDER_SITE_OTHER): Payer: Self-pay | Admitting: Ophthalmology

## 2020-08-09 ENCOUNTER — Ambulatory Visit (INDEPENDENT_AMBULATORY_CARE_PROVIDER_SITE_OTHER): Payer: Medicare HMO | Admitting: Ophthalmology

## 2020-08-09 DIAGNOSIS — H43813 Vitreous degeneration, bilateral: Secondary | ICD-10-CM | POA: Insufficient documentation

## 2020-08-09 DIAGNOSIS — H353211 Exudative age-related macular degeneration, right eye, with active choroidal neovascularization: Secondary | ICD-10-CM

## 2020-08-09 MED ORDER — BEVACIZUMAB CHEMO INJECTION 1.25MG/0.05ML SYRINGE FOR KALEIDOSCOPE
1.2500 mg | INTRAVITREAL | Status: AC | PRN
  Administered 2020-08-09: 1.25 mg via INTRAVITREAL

## 2020-08-09 NOTE — Assessment & Plan Note (Signed)

## 2020-08-09 NOTE — Progress Notes (Signed)
08/09/2020     CHIEF COMPLAINT Patient presents for Retina Follow Up   HISTORY OF PRESENT ILLNESS: Kevin Suarez is a 79 y.o. male who presents to the clinic today for:   HPI    Retina Follow Up    Patient presents with  Wet AMD.  In right eye.  Severity is moderate.  Duration of 8 weeks.  Since onset it is stable.  I, the attending physician,  performed the HPI with the patient and updated documentation appropriately.          Comments    8 Week Wet AMD f\u OD. Possible Avastin OD. OCT  Pt states he is having trouble focusing with his eyes this AM. Pt c/o multiple floaters in OD since last inj. Pt c/o discomfort in OS for about 10 weeks BGL: 125 this AM       Last edited by Tilda Franco on 08/09/2020  9:23 AM. (History)      Referring physician: Haywood Pao, MD [place],  [place] [address]  HISTORICAL INFORMATION:   Selected notes from the [facility]    Lab Results  Component Value Date   HGBA1C 7.0 (H) 07/14/2020     CURRENT MEDICATIONS: Current Outpatient Medications (Ophthalmic Drugs)  Medication Sig  . hydroxypropyl methylcellulose / hypromellose (ISOPTO TEARS / GONIOVISC) 2.5 % ophthalmic solution Place 1 drop into both eyes daily as needed for dry eyes.   No current facility-administered medications for this visit. (Ophthalmic Drugs)   Current Outpatient Medications (Other)  Medication Sig  . finasteride (PROSCAR) 5 MG tablet Take 5 mg by mouth daily.  Marland Kitchen losartan (COZAAR) 25 MG tablet Take 50 mg by mouth daily.   . metFORMIN (GLUCOPHAGE) 500 MG tablet Take 500 mg by mouth 2 (two) times daily with a meal.   . omeprazole (PRILOSEC) 40 MG capsule Take 40 mg by mouth daily before breakfast.  . pantoprazole (PROTONIX) 40 MG tablet Take 40 mg by mouth every evening.   . tamsulosin (FLOMAX) 0.4 MG CAPS capsule Take 0.4 mg by mouth at bedtime.  . traMADol (ULTRAM) 50 MG tablet Take 1 tablet (50 mg total) by mouth every 6  (six) hours as needed.   No current facility-administered medications for this visit. (Other)      REVIEW OF SYSTEMS:    ALLERGIES Allergies  Allergen Reactions  . Prednisone Other (See Comments)    Doesn't like side effects of prednisone.     PAST MEDICAL HISTORY Past Medical History:  Diagnosis Date  . Arthritis    back - steroid injection  . Bladder stone   . BPH (benign prostatic hypertrophy)   . Frequency of urination   . GERD (gastroesophageal reflux disease)   . 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 Hearing loss 2020   no hearing aids  . History of kidney stones    surgery to remove stones  . Hyperlipidemia   . Hypertension   . Lung nodules    LAST CT 2012  PER DR WRIGHT NOTE NO FURTHER WORK-UP (PULMOLOGIST)  . Type 2 diabetes mellitus ([place])   . Wears dentures    full upper  . Wears glasses   . Wears partial dentures    lower   Past Surgical History:  Procedure Laterality Date  . CATARACT EXTRACTION W/ INTRAOCULAR LENS  IMPLANT, BILATERAL    . CYSTOSCOPY WITH LITHOLAPAXY N/A 11/28/2013   Procedure: CYSTOSCOPY WITH stone retrieval;  Surgeon: Claybon Jabs,  MD;  Location: [facility];  Service: Urology;  Laterality: N/A;  . CYSTOSCOPY WITH LITHOLAPAXY N/A 10/15/2015   Procedure: CYSTOSCOPY WITH LITHOLAPAXY, HOLMIUM LASER LITHOTRIPSY tranurethral excision of prostate;  Surgeon: [name], MD;  Location: [facility];  Service: Urology;  Laterality: N/A;  . EXTRACORPOREAL SHOCK WAVE LITHOTRIPSY Right 05-13-2015  . HOLMIUM LASER APPLICATION N/A 46/07/6294   Procedure: HOLMIUM LASER APPLICATION;  Surgeon: [name], MD;  Location[facility];  Service: Urology;  Laterality: N/A;  . TOTAL HIP ARTHROPLASTY Bilateral 2000  &  2005  . TRANSURETHRAL RESECTION OF PROSTATE  11/28/2013   Procedure: TRANSURETHRAL RESECTION OF THE PROSTATE WITH  BUTTON GYRUS INSTRUMENTS;  Surgeon: Claybon Jabs, MD;  Location: [facility];  Service: Urology;;  . WRIST SURGERY Left 2005   INJURY  . XI ROBOTIC ASSISTED INGUINAL HERNIA REPAIR WITH MESH Left 07/20/2020   Procedure: ROBOTIC LEFT INGUINAL HERNIA WITH MESH USING PROGRIP LAPAROSCOPIC SELF-FIXATING MESH 16cm x 12cm;  Surgeon: Ralene Ok, MD;  Location: [facility];  Service: General;  Laterality: Left;    FAMILY HISTORY Family History  Problem Relation Age of Onset  . Cancer Mother        Unknown  . Cancer Maternal Grandmother   . Heart attack Paternal Grandfather   . Coronary artery disease Father   . Diabetes Father     SOCIAL HISTORY Social History   Tobacco Use  . Smoking status: Former Smoker    Packs/day: 2.00    Years: 30.00    Pack years: 60.00    Types: Cigarettes    Quit date: 11/14/1995    Years since quitting: 24.7  . Smokeless tobacco: Never Used  Vaping Use  . Vaping Use: Never used  Substance Use Topics  . Alcohol use: No  . Drug use: No         OPHTHALMIC EXAM:  Base Eye Exam    Visual Acuity (Snellen - Linear)      Right Left   Dist cc 20/30 + 20/25   Correction: Glasses       Tonometry (Tonopen, 9:22 AM)      Right Left   Pressure 15 5       Pupils      Pupils Dark Light Shape React APD   Right PERRL 2 2 Round Minimal None   Left PERRL 2 2 Round Minimal None       Visual Fields (Counting fingers)      Left Right    Full    Restrictions  Partial outer inferior nasal deficiency       Neuro/Psych    Oriented x3: Yes   Mood/Affect: Normal       Dilation    Both eyes: 1.0% Mydriacyl, 2.5% Phenylephrine @ 9:22 AM        Slit Lamp and Fundus Exam    External Exam      Right Left   External Normal Normal       Slit Lamp Exam      Right Left   Lids/Lashes Normal Normal   Conjunctiva/Sclera White and quiet White and quiet   Cornea Clear Clear   Anterior Chamber Deep and quiet Deep and quiet   Iris Round and reactive Round and reactive   Lens Posterior chamber intraocular lens Posterior chamber  intraocular lens   Anterior Vitreous Normal Normal       Fundus Exam      Right Left  Posterior Vitreous , Posterior vitreous detachment, peripheral vitreous floaters superiorly as well.    Disc Normal    C/D Ratio 0.55    Macula Retinal pigment epithelial atrophy, intermediate age related macular degeneration, no  hemorrhage, Mottling, Retinal pigment epithelial mottling    Vessels ,,, NO DR    Periphery Normal           IMAGING AND PROCEDURES  Imaging and Procedures for 08/09/20  OCT, Retina - OU - Both Eyes       Right Eye Quality was good. Scan locations included subfoveal. Central Foveal Thickness: 251. Progression has been stable. Findings include abnormal foveal contour, retinal drusen , pigment epithelial detachment.   Left Eye Quality was good. Scan locations included subfoveal. Central Foveal Thickness: 268. Findings include retinal drusen .   Notes OD with posterior vitreous detachment, retinal drusen, large sector macular atrophy superotemporal.  Pigment epithelial detachment in the juxta foveal region has improved.    OS with retinal drusen, no active maculopathy, with posterior vitreous detachment       Intravitreal Injection, Pharmacologic Agent - OD - Right Eye       Time Out 08/09/2020. 10:21 AM. Confirmed correct patient, procedure, site, and patient consented.   Anesthesia Topical anesthesia was used. Anesthetic medications included Akten 3.5%.   Procedure Preparation included 5% betadine to ocular surface, 10% betadine to eyelids, Tobramycin 0.3%. A 30 gauge needle was used.   Injection:  1.25 mg Bevacizumab (AVASTIN) SOLN   NDC: [address]-6, Lot: [id number]   Route: Intravitreal, Site: Right Eye, Waste: 0 mg  Post-op Post injection exam found visual acuity of at least counting fingers. The patient tolerated the procedure well. There were no complications. The patient received written and verbal post procedure care education. Post injection  medications were not given.                 ASSESSMENT/PLAN:  Exudative age-related macular degeneration of right eye with active choroidal neovascularization (HCC) OD currently stable at 8-week follow-up interval.  We will repeat injection Avastin today and examination in 10 or 11 weeks  Posterior vitreous detachment of both eyes   The nature of posterior vitreous detachment was discussed with the patient as well as its physiology, its age prevalence, and its possible implication regarding retinal breaks and detachment.  An informational brochure was given to the patient.  All the patient's questions were answered.  The patient was asked to return if new or different flashes or floaters develops.   Patient was instructed to contact office immediately if any changes were noticed. I explained to the patient that vitreous inside the eye is similar to jello inside a bowl. As the jello melts it can start to pull away from the bowl, similarly the vitreous throughout our lives can begin to pull away from the retina. That process is called a posterior vitreous detachment. In some cases, the vitreous can tug hard enough on the retina to form a retinal tear. I discussed with the patient the signs and symptoms of a retinal detachment.  Do not rub the eye.      ICD-10-CM   1. Exudative age-related macular degeneration of right eye with active choroidal neovascularization (HCC)  H35.3211 OCT, Retina - OU - Both Eyes    Intravitreal Injection, Pharmacologic Agent - OD - Right Eye    Bevacizumab (AVASTIN) SOLN 1.25 mg  2. Posterior vitreous detachment of both eyes  H43.813     1.  OD, much less  active pigment epithelial detachment juxta foveal, at 8-week interval.  We will repeat injection intravitreal Avastin today and examination in 10 weeks 2.  3.  Ophthalmic Meds Ordered this visit:  Meds ordered this encounter  Medications  . Bevacizumab (AVASTIN) SOLN 1.25 mg       Return in about 10  weeks (around 10/18/2020) for dilate, OD, AVASTIN OCT.  There are no Patient Instructions on file for this visit.   Explained the diagnoses, plan, and follow up with the patient and they expressed understanding.  Patient expressed understanding of the importance of proper follow up care.   Clent Demark Kenyatta Gloeckner M.D. Diseases & Surgery of the Retina and Vitreous Retina [facility] Diabetic [place] 08/09/20     Abbreviations: M myopia (nearsighted); A astigmatism; H hyperopia (farsighted); P presbyopia; Mrx spectacle prescription;  CTL contact lenses; OD right eye; OS left eye; OU both eyes  XT exotropia; ET esotropia; PEK punctate epithelial keratitis; PEE punctate epithelial erosions; DES dry eye syndrome; MGD meibomian gland dysfunction; ATs artificial tears; PFAT's preservative free artificial tears; [place] nuclear sclerotic cataract; PSC posterior subcapsular cataract; ERM epi-retinal membrane; PVD posterior vitreous detachment; RD retinal detachment; DM diabetes mellitus; DR diabetic retinopathy; NPDR non-proliferative diabetic retinopathy; PDR proliferative diabetic retinopathy; CSME clinically significant macular edema; DME diabetic macular edema; dbh dot blot hemorrhages; CWS cotton wool spot; POAG primary open angle glaucoma; C/D cup-to-disc ratio; HVF humphrey visual field; GVF goldmann visual field; OCT optical coherence tomography; IOP intraocular pressure; BRVO Branch retinal vein occlusion; CRVO central retinal vein occlusion; CRAO central retinal artery occlusion; BRAO branch retinal artery occlusion; RT retinal tear; SB scleral buckle; PPV pars plana vitrectomy; VH Vitreous hemorrhage; PRP panretinal laser photocoagulation; IVK intravitreal kenalog; VMT vitreomacular traction; MH Macular hole;  NVD neovascularization of the disc; NVE neovascularization elsewhere; AREDS age related eye disease study; ARMD age related macular degeneration; POAG primary open angle glaucoma; EBMD epithelial/anterior  basement membrane dystrophy; ACIOL anterior chamber intraocular lens; IOL intraocular lens; PCIOL posterior chamber intraocular lens; Phaco/IOL phacoemulsification with intraocular lens placement; Gray photorefractive keratectomy; LASIK laser assisted in situ keratomileusis; HTN hypertension; DM diabetes mellitus; COPD chronic obstructive pulmonary disease

## 2020-08-09 NOTE — Assessment & Plan Note (Signed)
OD currently stable at 8-week follow-up interval.  We will repeat injection Avastin today and examination in 10 or 11 weeks

## 2020-08-17 DIAGNOSIS — M47816 Spondylosis without myelopathy or radiculopathy, lumbar region: Secondary | ICD-10-CM | POA: Diagnosis not present

## 2020-08-17 DIAGNOSIS — M48061 Spinal stenosis, lumbar region without neurogenic claudication: Secondary | ICD-10-CM | POA: Diagnosis not present

## 2020-08-17 DIAGNOSIS — M5459 Other low back pain: Secondary | ICD-10-CM | POA: Diagnosis not present

## 2020-08-28 ENCOUNTER — Other Ambulatory Visit: Payer: Self-pay

## 2020-08-28 ENCOUNTER — Ambulatory Visit: Payer: Medicare HMO | Attending: Internal Medicine

## 2020-08-28 DIAGNOSIS — Z23 Encounter for immunization: Secondary | ICD-10-CM

## 2020-08-28 NOTE — Progress Notes (Signed)
   Covid-19 Vaccination Clinic  Name:  KOVEN BELINSKY    MRN: [id number] DOB: Oct 26, 1941  08/28/2020  Mr. Cressman was observed post Covid-19 immunization for 15 minutes without incident. He was provided with Vaccine Information Sheet and instruction to access the V-Safe system.   Mr. Steinborn was instructed to call 911 with any severe reactions post vaccine: Marland Kitchen Difficulty breathing  . Swelling of face and throat  . A fast heartbeat  . A bad rash all over body  . Dizziness and weakness

## 2020-08-31 DIAGNOSIS — Z6832 Body mass index (BMI) 32.0-32.9, adult: Secondary | ICD-10-CM | POA: Diagnosis not present

## 2020-08-31 DIAGNOSIS — M5136 Other intervertebral disc degeneration, lumbar region: Secondary | ICD-10-CM | POA: Diagnosis not present

## 2020-08-31 DIAGNOSIS — M47816 Spondylosis without myelopathy or radiculopathy, lumbar region: Secondary | ICD-10-CM | POA: Diagnosis not present

## 2020-08-31 DIAGNOSIS — G588 Other specified mononeuropathies: Secondary | ICD-10-CM | POA: Diagnosis not present

## 2020-09-07 DIAGNOSIS — E538 Deficiency of other specified B group vitamins: Secondary | ICD-10-CM | POA: Diagnosis not present

## 2020-10-01 DIAGNOSIS — H0102A Squamous blepharitis right eye, upper and lower eyelids: Secondary | ICD-10-CM | POA: Diagnosis not present

## 2020-10-01 DIAGNOSIS — H401212 Low-tension glaucoma, right eye, moderate stage: Secondary | ICD-10-CM | POA: Diagnosis not present

## 2020-10-01 DIAGNOSIS — R69 Illness, unspecified: Secondary | ICD-10-CM | POA: Diagnosis not present

## 2020-10-01 DIAGNOSIS — E119 Type 2 diabetes mellitus without complications: Secondary | ICD-10-CM | POA: Diagnosis not present

## 2020-10-01 DIAGNOSIS — H40012 Open angle with borderline findings, low risk, left eye: Secondary | ICD-10-CM | POA: Diagnosis not present

## 2020-10-12 DIAGNOSIS — E538 Deficiency of other specified B group vitamins: Secondary | ICD-10-CM | POA: Diagnosis not present

## 2020-10-14 DIAGNOSIS — G588 Other specified mononeuropathies: Secondary | ICD-10-CM | POA: Diagnosis not present

## 2020-10-18 ENCOUNTER — Encounter (INDEPENDENT_AMBULATORY_CARE_PROVIDER_SITE_OTHER): Payer: Self-pay | Admitting: Ophthalmology

## 2020-10-18 ENCOUNTER — Ambulatory Visit (INDEPENDENT_AMBULATORY_CARE_PROVIDER_SITE_OTHER): Payer: Medicare HMO | Admitting: Ophthalmology

## 2020-10-18 ENCOUNTER — Other Ambulatory Visit: Payer: Self-pay

## 2020-10-18 DIAGNOSIS — H353211 Exudative age-related macular degeneration, right eye, with active choroidal neovascularization: Secondary | ICD-10-CM | POA: Diagnosis not present

## 2020-10-18 DIAGNOSIS — H353132 Nonexudative age-related macular degeneration, bilateral, intermediate dry stage: Secondary | ICD-10-CM

## 2020-10-18 DIAGNOSIS — H43811 Vitreous degeneration, right eye: Secondary | ICD-10-CM

## 2020-10-18 MED ORDER — BEVACIZUMAB 2.5 MG/0.1ML IZ SOSY
2.5000 mg | PREFILLED_SYRINGE | INTRAVITREAL | Status: AC | PRN
  Administered 2020-10-18: 2.5 mg via INTRAVITREAL

## 2020-10-18 NOTE — Progress Notes (Signed)
10/18/2020     CHIEF COMPLAINT Patient presents for Retina Follow Up   HISTORY OF PRESENT ILLNESS: Kevin Suarez is a 79 y.o. male who presents to the clinic today for:   HPI    Retina Follow Up    Patient presents with  Wet AMD.  In right eye.  Severity is moderate.  Duration of 10 weeks.  Since onset it is stable.  I, the attending physician,  performed the HPI with the patient and updated documentation appropriately.          Comments    10 Week Wet AMD f\u OD. Possible Avastin OD. OCT  Pt c/o multiple floaters in OD since last inj. Pt states he has not had any changes in vision.  BGL: 126       Last edited by Tilda Franco on 10/18/2020 10:44 AM. (History)      Referring physician: Haywood Pao, MD [place],  [place] [id number]  HISTORICAL INFORMATION:   Selected notes from the [facility]    Lab Results  Component Value Date   HGBA1C 7.0 (H) 07/14/2020     CURRENT MEDICATIONS: Current Outpatient Medications (Ophthalmic Drugs)  Medication Sig  . hydroxypropyl methylcellulose / hypromellose (ISOPTO TEARS / GONIOVISC) 2.5 % ophthalmic solution Place 1 drop into both eyes daily as needed for dry eyes.   No current facility-administered medications for this visit. (Ophthalmic Drugs)   Current Outpatient Medications (Other)  Medication Sig  . finasteride (PROSCAR) 5 MG tablet Take 5 mg by mouth daily.  Marland Kitchen losartan (COZAAR) 25 MG tablet Take 50 mg by mouth daily.   . metFORMIN (GLUCOPHAGE) 500 MG tablet Take 500 mg by mouth 2 (two) times daily with a meal.   . omeprazole (PRILOSEC) 40 MG capsule Take 40 mg by mouth daily before breakfast.  . pantoprazole (PROTONIX) 40 MG tablet Take 40 mg by mouth every evening.   . tamsulosin (FLOMAX) 0.4 MG CAPS capsule Take 0.4 mg by mouth at bedtime.  . traMADol (ULTRAM) 50 MG tablet Take 1 tablet (50 mg total) by mouth every 6 (six) hours as needed.   No current facility-administered  medications for this visit. (Other)      REVIEW OF SYSTEMS: ROS    Positive for: Endocrine   Last edited by Tilda Franco on 10/18/2020 10:44 AM. (History)       ALLERGIES Allergies  Allergen Reactions  . Prednisone Other (See Comments)    Doesn't like side effects of prednisone.     PAST MEDICAL HISTORY Past Medical History:  Diagnosis Date  . Arthritis    back - steroid injection  . Bladder stone   . BPH (benign prostatic hypertrophy)   . Frequency of urination   . GERD (gastroesophageal reflux disease)   . 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 Hearing loss 2020   no hearing aids  . History of kidney stones    surgery to remove stones  . Hyperlipidemia   . Hypertension   . Lung nodules    LAST CT 2012  PER DR WRIGHT NOTE NO FURTHER WORK-UP (PULMOLOGIST)  . Type 2 diabetes mellitus ([place])   . Wears dentures    full upper  . Wears glasses   . Wears partial dentures    lower   Past Surgical History:  Procedure Laterality Date  . CATARACT EXTRACTION W/ INTRAOCULAR LENS  IMPLANT, BILATERAL    . CYSTOSCOPY WITH LITHOLAPAXY N/A 11/28/2013  Procedure: CYSTOSCOPY WITH stone retrieval;  Surgeon: Claybon Jabs, MD;  Location: [facility];  Service: Urology;  Laterality: N/A;  . CYSTOSCOPY WITH LITHOLAPAXY N/A 10/15/2015   Procedure: CYSTOSCOPY WITH LITHOLAPAXY, HOLMIUM LASER LITHOTRIPSY tranurethral excision of prostate;  Surgeon: [name], MD;  Location: [facility];  Service: Urology;  Laterality: N/A;  . EXTRACORPOREAL SHOCK WAVE LITHOTRIPSY Right 05-13-2015  . HOLMIUM LASER APPLICATION N/A [id number]/2774   Procedure: HOLMIUM LASER APPLICATION;  Surgeon: [name], MD;  Location: [facility];  Service: Urology;  Laterality: N/A;  . TOTAL HIP ARTHROPLASTY Bilateral 2000  &  2005  . TRANSURETHRAL RESECTION OF PROSTATE  11/28/2013   Procedure: TRANSURETHRAL RESECTION OF THE PROSTATE WITH  BUTTON GYRUS INSTRUMENTS;  Surgeon: Claybon Jabs, MD;  Location: [facility];  Service: Urology;;  . WRIST SURGERY Left 2005   INJURY  . XI ROBOTIC ASSISTED INGUINAL HERNIA REPAIR WITH MESH Left 07/20/2020   Procedure: ROBOTIC LEFT INGUINAL HERNIA WITH MESH USING PROGRIP LAPAROSCOPIC SELF-FIXATING MESH 16cm x 12cm;  Surgeon: Ralene Ok, MD;  Location: [facility] OR;  Service: General;  Laterality: Left;    FAMILY HISTORY Family History  Problem Relation Age of Onset  . Cancer Mother        Unknown  . Cancer Maternal Grandmother   . Heart attack Paternal Grandfather   . Coronary artery disease Father   . Diabetes Father     SOCIAL HISTORY Social History   Tobacco Use  . Smoking status: Former Smoker    Packs/day: 2.00    Years: 30.00    Pack years: 60.00    Types: Cigarettes    Quit date: 11/14/1995    Years since quitting: 24.9  . Smokeless tobacco: Never Used  Vaping Use  . Vaping Use: Never used  Substance Use Topics  . Alcohol use: No  . Drug use: No         OPHTHALMIC EXAM: Base Eye Exam    Visual Acuity (Snellen - Linear)      Right Left   Dist cc 20/25 -1 20/20 -1       Tonometry (Tonopen, 10:48 AM)      Right Left   Pressure 13 10       Pupils      Pupils Dark Light Shape React APD   Right PERRL 2 2 Round Minimal None   Left PERRL 2 2 Round Minimal None       Visual Fields (Counting fingers)      Left Right    Full Full       Neuro/Psych    Oriented x3: Yes   Mood/Affect: Normal       Dilation    Right eye: 1.0% Mydriacyl, 2.5% Phenylephrine @ 10:48 AM        Slit Lamp and Fundus Exam    External Exam      Right Left   External Normal Normal       Slit Lamp Exam      Right Left   Lids/Lashes Normal Normal   Conjunctiva/Sclera White and quiet White and quiet   Cornea Clear Clear   Anterior Chamber Deep and quiet Deep and quiet   Iris Round and reactive Round and reactive   Lens Posterior chamber intraocular lens Posterior chamber intraocular lens    Anterior Vitreous Normal Normal       Fundus Exam      Right Left   Posterior  Vitreous , Posterior vitreous detachment, peripheral vitreous floaters superiorly as well.    Disc Normal    C/D Ratio 0.55    Macula Retinal pigment epithelial atrophy, intermediate age related macular degeneration, no  hemorrhage, Mottling, Retinal pigment epithelial mottling    Vessels ,,, NO DR    Periphery Normal           IMAGING AND PROCEDURES  Imaging and Procedures for 10/18/20  OCT, Retina - OU - Both Eyes       Right Eye Quality was good. Scan locations included subfoveal. Central Foveal Thickness: 253. Progression has improved. Findings include no IRF, no SRF.   Left Eye Quality was good. Scan locations included subfoveal. Central Foveal Thickness: 274. Findings include retinal drusen , no SRF, no IRF, vitreomacular adhesion .   Notes OD with posterior vitreous detachment  OD with much less active CNVM, at 10-week interval today       Intravitreal Injection, Pharmacologic Agent - OD - Right Eye       Time Out 10/18/2020. 11:18 AM. Confirmed correct patient, procedure, site, and patient consented.   Anesthesia Topical anesthesia was used. Anesthetic medications included Akten 3.5%.   Procedure Preparation included 5% betadine to ocular surface, 10% betadine to eyelids, Tobramycin 0.3%. A 30 gauge needle was used.   Injection:  2.5 mg Bevacizumab (AVASTIN) 2.5mg /0.17mL SOSY   NDC: 13086-578-46, Lot: [id number]   Route: Intravitreal, Site: Right Eye  Post-op Post injection exam found visual acuity of at least counting fingers. The patient tolerated the procedure well. There were no complications. The patient received written and verbal post procedure care education. Post injection medications were not given.                 ASSESSMENT/PLAN:  Exudative age-related macular degeneration of right eye with active choroidal neovascularization (HCC) History of CNVM  superotemporal, and subfoveal, at 10-week interval OD.  We will repeat injection today and examination again in 3 months  Intermediate stage nonexudative age-related macular degeneration of both eyes The nature of age--related macular degeneration was discussed with the patient as well as the distinction between dry and wet types. Checking an Amsler Grid daily with advice to return immediately should a distortion develop, was given to the patient. The patient 's smoking status now and in the past was determined and advice based on the AREDS study was provided regarding the consumption of antioxidant supplements. AREDS 2 vitamin formulation was recommended. Consumption of dark leafy vegetables and fresh fruits of various colors was recommended. Treatment modalities for wet macular degeneration particularly the use of intravitreal injections of anti-blood vessel growth factors was discussed with the patient. Avastin, Lucentis, and Eylea are the available options. On occasion, therapy includes the use of photodynamic therapy and thermal laser. Stressed to the patient do not rub eyes.  Patient was advised to check Amsler Grid daily and return immediately if changes are noted. Instructions on using the grid were given to the patient. All patient questions were answered.      ICD-10-CM   1. Exudative age-related macular degeneration of right eye with active choroidal neovascularization (HCC)  H35.3211 OCT, Retina - OU - Both Eyes    Intravitreal Injection, Pharmacologic Agent - OD - Right Eye    bevacizumab (AVASTIN) SOSY 2.5 mg  2. Nonexudative age-related macular degeneration, bilateral, intermediate dry stage  H35.3132 Intravitreal Injection, Pharmacologic Agent - OD - Right Eye    bevacizumab (AVASTIN) SOSY 2.5 mg  3. Posterior vitreous detachment  of right eye  H43.811   4. Intermediate stage nonexudative age-related macular degeneration of both eyes  H35.3132     1.  Repeat intravitreal Avastin OD  today  2.  Dilate OU next  3.  Ophthalmic Meds Ordered this visit:  Meds ordered this encounter  Medications  . bevacizumab (AVASTIN) SOSY 2.5 mg       Return in about 3 months (around 01/16/2021) for DILATE OU, AVASTIN OCT, OD.  Patient Instructions  Patient instructed to contact the office promptly for new onset visual acuity decline or distortion    Explained the diagnoses, plan, and follow up with the patient and they expressed understanding.  Patient expressed understanding of the importance of proper follow up care.   Clent Demark Shironda Kain M.D. Diseases & Surgery of the Retina and Vitreous [facility] [place] 10/18/20     Abbreviations: M myopia (nearsighted); A astigmatism; H hyperopia (farsighted); P presbyopia; Mrx spectacle prescription;  CTL contact lenses; OD right eye; OS left eye; OU both eyes  XT exotropia; ET esotropia; PEK punctate epithelial keratitis; PEE punctate epithelial erosions; DES dry eye syndrome; MGD meibomian gland dysfunction; ATs artificial tears; PFAT's preservative free artificial tears; [place] nuclear sclerotic cataract; PSC posterior subcapsular cataract; ERM epi-retinal membrane; PVD posterior vitreous detachment; RD retinal detachment; DM diabetes mellitus; DR diabetic retinopathy; NPDR non-proliferative diabetic retinopathy; PDR proliferative diabetic retinopathy; CSME clinically significant macular edema; DME diabetic macular edema; dbh dot blot hemorrhages; CWS cotton wool spot; POAG primary open angle glaucoma; C/D cup-to-disc ratio; HVF humphrey visual field; GVF goldmann visual field; OCT optical coherence tomography; IOP intraocular pressure; BRVO Branch retinal vein occlusion; CRVO central retinal vein occlusion; CRAO central retinal artery occlusion; BRAO branch retinal artery occlusion; RT retinal tear; SB scleral buckle; PPV pars plana vitrectomy; VH Vitreous hemorrhage; PRP panretinal laser photocoagulation; IVK intravitreal kenalog; VMT  vitreomacular traction; MH Macular hole;  NVD neovascularization of the disc; NVE neovascularization elsewhere; AREDS age related eye disease study; ARMD age related macular degeneration; POAG primary open angle glaucoma; EBMD epithelial/anterior basement membrane dystrophy; ACIOL anterior chamber intraocular lens; IOL intraocular lens; PCIOL posterior chamber intraocular lens; Phaco/IOL phacoemulsification with intraocular lens placement; [place] photorefractive keratectomy; LASIK laser assisted in situ keratomileusis; HTN hypertension; DM diabetes mellitus; COPD chronic obstructive pulmonary disease

## 2020-10-18 NOTE — Patient Instructions (Signed)
Patient instructed to contact the office promptly for new onset visual acuity decline or distortion 

## 2020-10-18 NOTE — Assessment & Plan Note (Addendum)
History of CNVM superotemporal, and subfoveal, at 10-week interval OD.  We will repeat injection today and examination again in 3 months

## 2020-10-18 NOTE — Assessment & Plan Note (Signed)

## 2020-11-08 DIAGNOSIS — G588 Other specified mononeuropathies: Secondary | ICD-10-CM | POA: Diagnosis not present

## 2020-11-25 DIAGNOSIS — E538 Deficiency of other specified B group vitamins: Secondary | ICD-10-CM | POA: Diagnosis not present

## 2020-12-08 DIAGNOSIS — G588 Other specified mononeuropathies: Secondary | ICD-10-CM | POA: Diagnosis not present

## 2020-12-08 DIAGNOSIS — G894 Chronic pain syndrome: Secondary | ICD-10-CM | POA: Diagnosis not present

## 2020-12-15 DIAGNOSIS — G894 Chronic pain syndrome: Secondary | ICD-10-CM | POA: Diagnosis not present

## 2020-12-15 DIAGNOSIS — G588 Other specified mononeuropathies: Secondary | ICD-10-CM | POA: Diagnosis not present

## 2021-01-04 DIAGNOSIS — G6289 Other specified polyneuropathies: Secondary | ICD-10-CM | POA: Diagnosis not present

## 2021-01-04 DIAGNOSIS — G894 Chronic pain syndrome: Secondary | ICD-10-CM | POA: Diagnosis not present

## 2021-01-04 DIAGNOSIS — G588 Other specified mononeuropathies: Secondary | ICD-10-CM | POA: Diagnosis not present

## 2021-01-07 DIAGNOSIS — Z125 Encounter for screening for malignant neoplasm of prostate: Secondary | ICD-10-CM | POA: Diagnosis not present

## 2021-01-07 DIAGNOSIS — N182 Chronic kidney disease, stage 2 (mild): Secondary | ICD-10-CM | POA: Diagnosis not present

## 2021-01-07 DIAGNOSIS — E1129 Type 2 diabetes mellitus with other diabetic kidney complication: Secondary | ICD-10-CM | POA: Diagnosis not present

## 2021-01-07 DIAGNOSIS — I129 Hypertensive chronic kidney disease with stage 1 through stage 4 chronic kidney disease, or unspecified chronic kidney disease: Secondary | ICD-10-CM | POA: Diagnosis not present

## 2021-01-07 DIAGNOSIS — E538 Deficiency of other specified B group vitamins: Secondary | ICD-10-CM | POA: Diagnosis not present

## 2021-01-10 DIAGNOSIS — I129 Hypertensive chronic kidney disease with stage 1 through stage 4 chronic kidney disease, or unspecified chronic kidney disease: Secondary | ICD-10-CM | POA: Diagnosis not present

## 2021-01-10 DIAGNOSIS — K219 Gastro-esophageal reflux disease without esophagitis: Secondary | ICD-10-CM | POA: Diagnosis not present

## 2021-01-10 DIAGNOSIS — E538 Deficiency of other specified B group vitamins: Secondary | ICD-10-CM | POA: Diagnosis not present

## 2021-01-10 DIAGNOSIS — R972 Elevated prostate specific antigen [PSA]: Secondary | ICD-10-CM | POA: Diagnosis not present

## 2021-01-10 DIAGNOSIS — G588 Other specified mononeuropathies: Secondary | ICD-10-CM | POA: Diagnosis not present

## 2021-01-10 DIAGNOSIS — E1122 Type 2 diabetes mellitus with diabetic chronic kidney disease: Secondary | ICD-10-CM | POA: Diagnosis not present

## 2021-01-10 DIAGNOSIS — M179 Osteoarthritis of knee, unspecified: Secondary | ICD-10-CM | POA: Diagnosis not present

## 2021-01-10 DIAGNOSIS — N182 Chronic kidney disease, stage 2 (mild): Secondary | ICD-10-CM | POA: Diagnosis not present

## 2021-01-13 ENCOUNTER — Encounter (INDEPENDENT_AMBULATORY_CARE_PROVIDER_SITE_OTHER): Payer: Self-pay

## 2021-01-14 DIAGNOSIS — I10 Essential (primary) hypertension: Secondary | ICD-10-CM | POA: Diagnosis not present

## 2021-01-14 DIAGNOSIS — R972 Elevated prostate specific antigen [PSA]: Secondary | ICD-10-CM | POA: Diagnosis not present

## 2021-01-14 DIAGNOSIS — R82998 Other abnormal findings in urine: Secondary | ICD-10-CM | POA: Diagnosis not present

## 2021-01-14 DIAGNOSIS — E669 Obesity, unspecified: Secondary | ICD-10-CM | POA: Diagnosis not present

## 2021-01-14 DIAGNOSIS — N182 Chronic kidney disease, stage 2 (mild): Secondary | ICD-10-CM | POA: Diagnosis not present

## 2021-01-14 DIAGNOSIS — I129 Hypertensive chronic kidney disease with stage 1 through stage 4 chronic kidney disease, or unspecified chronic kidney disease: Secondary | ICD-10-CM | POA: Diagnosis not present

## 2021-01-14 DIAGNOSIS — E1129 Type 2 diabetes mellitus with other diabetic kidney complication: Secondary | ICD-10-CM | POA: Diagnosis not present

## 2021-01-14 DIAGNOSIS — E538 Deficiency of other specified B group vitamins: Secondary | ICD-10-CM | POA: Diagnosis not present

## 2021-01-14 DIAGNOSIS — Z0000 Encounter for general adult medical examination without abnormal findings: Secondary | ICD-10-CM | POA: Diagnosis not present

## 2021-01-14 DIAGNOSIS — M179 Osteoarthritis of knee, unspecified: Secondary | ICD-10-CM | POA: Diagnosis not present

## 2021-01-14 DIAGNOSIS — N401 Enlarged prostate with lower urinary tract symptoms: Secondary | ICD-10-CM | POA: Diagnosis not present

## 2021-01-14 DIAGNOSIS — Z6833 Body mass index (BMI) 33.0-33.9, adult: Secondary | ICD-10-CM | POA: Diagnosis not present

## 2021-01-14 DIAGNOSIS — R809 Proteinuria, unspecified: Secondary | ICD-10-CM | POA: Diagnosis not present

## 2021-01-17 ENCOUNTER — Other Ambulatory Visit: Payer: Self-pay

## 2021-01-17 ENCOUNTER — Ambulatory Visit (INDEPENDENT_AMBULATORY_CARE_PROVIDER_SITE_OTHER): Payer: Medicare HMO | Admitting: Ophthalmology

## 2021-01-17 ENCOUNTER — Encounter (INDEPENDENT_AMBULATORY_CARE_PROVIDER_SITE_OTHER): Payer: Self-pay | Admitting: Ophthalmology

## 2021-01-17 DIAGNOSIS — H35721 Serous detachment of retinal pigment epithelium, right eye: Secondary | ICD-10-CM

## 2021-01-17 DIAGNOSIS — H353132 Nonexudative age-related macular degeneration, bilateral, intermediate dry stage: Secondary | ICD-10-CM | POA: Diagnosis not present

## 2021-01-17 DIAGNOSIS — H353211 Exudative age-related macular degeneration, right eye, with active choroidal neovascularization: Secondary | ICD-10-CM | POA: Diagnosis not present

## 2021-01-17 DIAGNOSIS — E119 Type 2 diabetes mellitus without complications: Secondary | ICD-10-CM

## 2021-01-17 MED ORDER — BEVACIZUMAB 2.5 MG/0.1ML IZ SOSY
2.5000 mg | PREFILLED_SYRINGE | INTRAVITREAL | Status: AC | PRN
  Administered 2021-01-17: 2.5 mg via INTRAVITREAL

## 2021-01-17 NOTE — Assessment & Plan Note (Signed)
OS stable, no active disease

## 2021-01-17 NOTE — Assessment & Plan Note (Signed)
No DR ?

## 2021-01-17 NOTE — Assessment & Plan Note (Signed)
Most recently visualized as component of CNVM, February 2021, improved overall now

## 2021-01-17 NOTE — Assessment & Plan Note (Signed)
Much less active disease centrally and paracentrally OD currently at 24-month follow-up.  We will repeat injection intravitreal Avastin OD today and again 53-month examination

## 2021-01-17 NOTE — Progress Notes (Signed)
01/17/2021     CHIEF COMPLAINT Patient presents for Retina Follow Up (3 Mo F/U OU, poss Avastin OD//Pt c/o new floaters OD x 2 months that move around in [place]. Pt sts the floaters move "in and out" of vision OD. Pt denies any other changes to [place] OU.)   HISTORY OF PRESENT ILLNESS: Kevin Suarez is a 80 y.o. male who presents to the clinic today for:   HPI    Retina Follow Up    Patient presents with  Wet AMD.  In right eye.  This started 3 months ago.  Severity is mild.  Duration of 3 months.  Since onset it is stable. Additional comments: 3 Mo F/U OU, poss Avastin OD  Pt c/o new floaters OD x 2 months that move around in [place]. Pt sts the floaters move "in and out" of vision OD. Pt denies any other changes to [place] OU.       Last edited by Rockie Neighbours, [place] on 01/17/2021 10:53 AM. (History)      Referring physician: Haywood Pao, MD [place],  [place] [address]  HISTORICAL INFORMATION:   Selected notes from the [place]    Lab Results  Component Value Date   HGBA1C 7.0 (H) 07/14/2020     CURRENT MEDICATIONS: Current Outpatient Medications (Ophthalmic Drugs)  Medication Sig  . hydroxypropyl methylcellulose / hypromellose (ISOPTO TEARS / GONIOVISC) 2.5 % ophthalmic solution Place 1 drop into both eyes daily as needed for dry eyes.   No current facility-administered medications for this visit. (Ophthalmic Drugs)   Current Outpatient Medications (Other)  Medication Sig  . finasteride (PROSCAR) 5 MG tablet Take 5 mg by mouth daily.  Marland Kitchen losartan (COZAAR) 25 MG tablet Take 50 mg by mouth daily.   . metFORMIN (GLUCOPHAGE) 500 MG tablet Take 500 mg by mouth 2 (two) times daily with a meal.   . omeprazole (PRILOSEC) 40 MG capsule Take 40 mg by mouth daily before breakfast.  . pantoprazole (PROTONIX) 40 MG tablet Take 40 mg by mouth every evening.   . tamsulosin (FLOMAX) 0.4 MG CAPS capsule Take 0.4 mg by mouth at bedtime.  . traMADol (ULTRAM) 50 MG  tablet Take 1 tablet (50 mg total) by mouth every 6 (six) hours as needed.   No current facility-administered medications for this visit. (Other)      REVIEW OF SYSTEMS:    ALLERGIES Allergies  Allergen Reactions  . Prednisone Other (See Comments)    Doesn't like side effects of prednisone.     PAST MEDICAL HISTORY Past Medical History:  Diagnosis Date  . Arthritis    back - steroid injection  . Bladder stone   . BPH (benign prostatic hypertrophy)   . Frequency of urination   . GERD (gastroesophageal reflux disease)   . 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 Hearing loss 2020   no hearing aids  . History of kidney stones    surgery to remove stones  . Hyperlipidemia   . Hypertension   . Lung nodules    LAST CT 2012  PER DR WRIGHT NOTE NO FURTHER WORK-UP (PULMOLOGIST)  . Type 2 diabetes mellitus ([place])   . Wears dentures    full upper  . Wears glasses   . Wears partial dentures    lower   Past Surgical History:  Procedure Laterality Date  . CATARACT EXTRACTION W/ INTRAOCULAR LENS  IMPLANT, BILATERAL    . CYSTOSCOPY WITH LITHOLAPAXY N/A 11/28/2013  Procedure: CYSTOSCOPY WITH stone retrieval;  Surgeon: Claybon Jabs, MD;  Location: [facility];  Service: Urology;  Laterality: N/A;  . CYSTOSCOPY WITH LITHOLAPAXY N/A 10/15/2015   Procedure: CYSTOSCOPY WITH LITHOLAPAXY, HOLMIUM LASER LITHOTRIPSY tranurethral excision of prostate;  Surgeon: [name], MD;  Location: [facility];  Service: Urology;  Laterality: N/A;  . EXTRACORPOREAL SHOCK WAVE LITHOTRIPSY Right 05-13-2015  . HOLMIUM LASER APPLICATION N/A 80/07/9832   Procedure: HOLMIUM LASER APPLICATION;  Surgeon: [name], MD;  Location: [facility];  Service: Urology;  Laterality: N/A;  . TOTAL HIP ARTHROPLASTY Bilateral 2000  &  2005  . TRANSURETHRAL RESECTION OF PROSTATE  11/28/2013   Procedure: TRANSURETHRAL RESECTION OF THE PROSTATE WITH  BUTTON GYRUS INSTRUMENTS;  Surgeon:  Claybon Jabs, MD;  Location: [place];  Service: Urology;;  . WRIST SURGERY Left 2005   INJURY  . XI ROBOTIC ASSISTED INGUINAL HERNIA REPAIR WITH MESH Left 07/20/2020   Procedure: ROBOTIC LEFT INGUINAL HERNIA WITH MESH USING PROGRIP LAPAROSCOPIC SELF-FIXATING MESH 16cm x 12cm;  Surgeon: Ralene Ok, MD;  Location: [facility];  Service: General;  Laterality: Left;    FAMILY HISTORY Family History  Problem Relation Age of Onset  . Cancer Mother        Unknown  . Cancer Maternal Grandmother   . Heart attack Paternal Grandfather   . Coronary artery disease Father   . Diabetes Father     SOCIAL HISTORY Social History   Tobacco Use  . Smoking status: Former Smoker    Packs/day: 2.00    Years: 30.00    Pack years: 60.00    Types: Cigarettes    Quit date: 11/14/1995    Years since quitting: 25.1  . Smokeless tobacco: Never Used  Vaping Use  . Vaping Use: Never used  Substance Use Topics  . Alcohol use: No  . Drug use: No         OPHTHALMIC EXAM:  Base Eye Exam    Visual Acuity (ETDRS)      Right Left   Dist cc 20/20 20/20 -1   Correction: Glasses       Tonometry (Tonopen, 10:53 AM)      Right Left   Pressure 12 11       Pupils      Pupils Dark Light Shape React APD   Right PERRL 2 2 Round Minimal None   Left PERRL 2 2 Round Minimal None       Visual Fields (Counting fingers)      Left Right    Full Full       Extraocular Movement      Right Left    Full Full       Neuro/Psych    Oriented x3: Yes   Mood/Affect: Normal       Dilation    Both eyes: 1.0% Mydriacyl, 2.5% Phenylephrine @ 10:57 AM        Slit Lamp and Fundus Exam    External Exam      Right Left   External Normal Normal       Slit Lamp Exam      Right Left   Lids/Lashes Normal Normal   Conjunctiva/Sclera White and quiet White and quiet   Cornea Clear Clear   Anterior Chamber Deep and quiet Deep and quiet   Iris Round and reactive Round and reactive   Lens  Posterior chamber intraocular lens Posterior chamber intraocular lens  Anterior Vitreous Normal Normal       Fundus Exam      Right Left   Posterior Vitreous , Posterior vitreous detachment, peripheral vitreous floaters superiorly as well.    Disc Normal    C/D Ratio 0.55 0.6   Macula Retinal pigment epithelial atrophy, intermediate age related macular degeneration, Mottling, Retinal pigment epithelial mottling, Hard drusen, Soft drusen, no macular thickening, no hemorrhage Retinal pigment epithelial mottling, no macular thickening, Hard drusen, no hemorrhage   Vessels ,,, NO DR    Periphery Normal           IMAGING AND PROCEDURES  Imaging and Procedures for 01/17/21  OCT, Retina - OU - Both Eyes       Right Eye Quality was good. Scan locations included subfoveal. Central Foveal Thickness: 261. Progression has improved. Findings include no IRF, abnormal foveal contour, no SRF.   Left Eye Quality was good. Scan locations included subfoveal. Central Foveal Thickness: 275. Progression has been stable. Findings include abnormal foveal contour, retinal drusen , no IRF, no SRF.   Notes No active CNVM, serous retinal detachment subfoveal right eye last visualized February 2021 completely resolved.       Intravitreal Injection, Pharmacologic Agent - OD - Right Eye       Time Out 01/17/2021. 11:53 AM. Confirmed correct patient, procedure, site, and patient consented.   Anesthesia Topical anesthesia was used. Anesthetic medications included Akten 3.5%.   Procedure Preparation included 5% betadine to ocular surface, 10% betadine to eyelids, Tobramycin 0.3%. A 30 gauge needle was used.   Injection:  2.5 mg Bevacizumab (AVASTIN) 2.5mg /0.85mL SOSY   NDC: 54650-354-65, Lot: [id number]   Route: Intravitreal, Site: Right Eye  Post-op Post injection exam found visual acuity of at least counting fingers. The patient tolerated the procedure well. There were no complications. The patient  received written and verbal post procedure care education. Post injection medications were not given.                 ASSESSMENT/PLAN:  Serous detachment of retinal pigment epithelium of right eye Most recently visualized as component of CNVM, February 2021, improved overall now  Exudative age-related macular degeneration of right eye with active choroidal neovascularization ([place]) Much less active disease centrally and paracentrally OD currently at 25-month follow-up.  We will repeat injection intravitreal Avastin OD today and again 88-month examination  Diabetes mellitus without complication (HCC) No DR  Intermediate stage nonexudative age-related macular degeneration of both eyes OS stable, no active disease      ICD-10-CM   1. Exudative age-related macular degeneration of right eye with active choroidal neovascularization (HCC)  H35.3211 OCT, Retina - OU - Both Eyes    Intravitreal Injection, Pharmacologic Agent - OD - Right Eye    bevacizumab (AVASTIN) SOSY 2.5 mg  2. Serous detachment of retinal pigment epithelium of right eye  H35.721   3. Diabetes mellitus without complication ([place])  Z00.1   4. Intermediate stage nonexudative age-related macular degeneration of both eyes  H35.3132     1.  2.  3.  Ophthalmic Meds Ordered this visit:  Meds ordered this encounter  Medications  . bevacizumab (AVASTIN) SOSY 2.5 mg       Return in about 3 months (around 04/19/2021) for DILATE OU, AVASTIN OCT, OD.  There are no Patient Instructions on file for this visit.   Explained the diagnoses, plan, and follow up with the patient and they expressed understanding.  Patient expressed understanding of the importance  of proper follow up care.   Clent Demark Rankin M.D. Diseases & Surgery of the Retina and Vitreous Retina & Diabetic [place] 01/17/21     Abbreviations: M myopia (nearsighted); A astigmatism; H hyperopia (farsighted); P presbyopia; Mrx spectacle prescription;   CTL contact lenses; OD right eye; OS left eye; OU both eyes  XT exotropia; ET esotropia; PEK punctate epithelial keratitis; PEE punctate epithelial erosions; DES dry eye syndrome; MGD meibomian gland dysfunction; ATs artificial tears; PFAT's preservative free artificial tears; [place] nuclear sclerotic cataract; PSC posterior subcapsular cataract; ERM epi-retinal membrane; PVD posterior vitreous detachment; RD retinal detachment; DM diabetes mellitus; DR diabetic retinopathy; NPDR non-proliferative diabetic retinopathy; PDR proliferative diabetic retinopathy; CSME clinically significant macular edema; DME diabetic macular edema; dbh dot blot hemorrhages; CWS cotton wool spot; POAG primary open angle glaucoma; C/D cup-to-disc ratio; HVF humphrey visual field; GVF goldmann visual field; OCT optical coherence tomography; IOP intraocular pressure; BRVO Branch retinal vein occlusion; CRVO central retinal vein occlusion; CRAO central retinal artery occlusion; BRAO branch retinal artery occlusion; RT retinal tear; SB scleral buckle; PPV pars plana vitrectomy; VH Vitreous hemorrhage; PRP panretinal laser photocoagulation; IVK intravitreal kenalog; VMT vitreomacular traction; MH Macular hole;  NVD neovascularization of the disc; NVE neovascularization elsewhere; AREDS age related eye disease study; ARMD age related macular degeneration; POAG primary open angle glaucoma; EBMD epithelial/anterior basement membrane dystrophy; ACIOL anterior chamber intraocular lens; IOL intraocular lens; PCIOL posterior chamber intraocular lens; Phaco/IOL phacoemulsification with intraocular lens placement; [place] photorefractive keratectomy; LASIK laser assisted in situ keratomileusis; HTN hypertension; DM diabetes mellitus; COPD chronic obstructive pulmonary disease   01/17/2021     CHIEF COMPLAINT Patient presents for Retina Follow Up (3 Mo F/U OU, poss Avastin OD//Pt c/o new floaters OD x 2 months that move around in [place]. Pt sts the floaters  move "in and out" of vision OD. Pt denies any other changes to [place] OU.)   HISTORY OF PRESENT ILLNESS: Kevin Suarez is a 80 y.o. male who presents to the clinic today for:   HPI    Retina Follow Up    Patient presents with  Wet AMD.  In right eye.  This started 3 months ago.  Severity is mild.  Duration of 3 months.  Since onset it is stable. Additional comments: 3 Mo F/U OU, poss Avastin OD  Pt c/o new floaters OD x 2 months that move around in [place]. Pt sts the floaters move "in and out" of vision OD. Pt denies any other changes to [place] OU.       Last edited by Rockie Neighbours, [place] on 01/17/2021 10:53 AM. (History)      Referring physician: Haywood Pao, MD [place],  [place] [address]  HISTORICAL INFORMATION:   Selected notes from the [place]    Lab Results  Component Value Date   HGBA1C 7.0 (H) 07/14/2020     CURRENT MEDICATIONS: Current Outpatient Medications (Ophthalmic Drugs)  Medication Sig  . hydroxypropyl methylcellulose / hypromellose (ISOPTO TEARS / GONIOVISC) 2.5 % ophthalmic solution Place 1 drop into both eyes daily as needed for dry eyes.   No current facility-administered medications for this visit. (Ophthalmic Drugs)   Current Outpatient Medications (Other)  Medication Sig  . finasteride (PROSCAR) 5 MG tablet Take 5 mg by mouth daily.  Marland Kitchen losartan (COZAAR) 25 MG tablet Take 50 mg by mouth daily.   . metFORMIN (GLUCOPHAGE) 500 MG tablet Take 500 mg by mouth 2 (two) times daily with a meal.   .  omeprazole (PRILOSEC) 40 MG capsule Take 40 mg by mouth daily before breakfast.  . pantoprazole (PROTONIX) 40 MG tablet Take 40 mg by mouth every evening.   . tamsulosin (FLOMAX) 0.4 MG CAPS capsule Take 0.4 mg by mouth at bedtime.  . traMADol (ULTRAM) 50 MG tablet Take 1 tablet (50 mg total) by mouth every 6 (six) hours as needed.   No current facility-administered medications for this visit. (Other)      REVIEW OF  SYSTEMS:    ALLERGIES Allergies  Allergen Reactions  . Prednisone Other (See Comments)    Doesn't like side effects of prednisone.     PAST MEDICAL HISTORY Past Medical History:  Diagnosis Date  . Arthritis    back - steroid injection  . Bladder stone   . BPH (benign prostatic hypertrophy)   . Frequency of urination   . GERD (gastroesophageal reflux disease)   . 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 Hearing loss 2020   no hearing aids  . History of kidney stones    surgery to remove stones  . Hyperlipidemia   . Hypertension   . Lung nodules    LAST CT 2012  PER DR WRIGHT NOTE NO FURTHER WORK-UP (PULMOLOGIST)  . Type 2 diabetes mellitus ([place])   . Wears dentures    full upper  . Wears glasses   . Wears partial dentures    lower   Past Surgical History:  Procedure Laterality Date  . CATARACT EXTRACTION W/ INTRAOCULAR LENS  IMPLANT, BILATERAL    . CYSTOSCOPY WITH LITHOLAPAXY N/A 11/28/2013   Procedure: CYSTOSCOPY WITH stone retrieval;  Surgeon: Claybon Jabs, MD;  Location: [facility];  Service: Urology;  Laterality: N/A;  . CYSTOSCOPY WITH LITHOLAPAXY N/A 10/15/2015   Procedure: CYSTOSCOPY WITH LITHOLAPAXY, HOLMIUM LASER LITHOTRIPSY tranurethral excision of prostate;  Surgeon: [name], MD;  Location[facility];  Service: Urology;  Laterality: N/A;  . EXTRACORPOREAL SHOCK WAVE LITHOTRIPSY Right 05-13-2015  . HOLMIUM LASER APPLICATION N/A 37/06/5884   Procedure: HOLMIUM LASER APPLICATION;  Surgeon: [name], MD;  Location: [facility];  Service: Urology;  Laterality: N/A;  . TOTAL HIP ARTHROPLASTY Bilateral 2000  &  2005  . TRANSURETHRAL RESECTION OF PROSTATE  11/28/2013   Procedure: TRANSURETHRAL RESECTION OF THE PROSTATE WITH  BUTTON GYRUS INSTRUMENTS;  Surgeon: Claybon Jabs, MD;  Location: [place];  Service: Urology;;  . WRIST SURGERY Left 2005   INJURY  . XI ROBOTIC ASSISTED INGUINAL HERNIA REPAIR WITH  MESH Left 07/20/2020   Procedure: ROBOTIC LEFT INGUINAL HERNIA WITH MESH USING PROGRIP LAPAROSCOPIC SELF-FIXATING MESH 16cm x 12cm;  Surgeon: Ralene Ok, MD;  Location: [facility] OR;  Service: General;  Laterality: Left;    FAMILY HISTORY Family History  Problem Relation Age of Onset  . Cancer Mother        Unknown  . Cancer Maternal Grandmother   . Heart attack Paternal Grandfather   . Coronary artery disease Father   . Diabetes Father     SOCIAL HISTORY Social History   Tobacco Use  . Smoking status: Former Smoker    Packs/day: 2.00    Years: 30.00    Pack years: 60.00    Types: Cigarettes    Quit date: 11/14/1995    Years since quitting: 25.1  . Smokeless tobacco: Never Used  Vaping Use  . Vaping Use: Never used  Substance Use Topics  . Alcohol use: No  . Drug use: No  OPHTHALMIC EXAM:  Base Eye Exam    Visual Acuity (ETDRS)      Right Left   Dist cc 20/20 20/20 -1   Correction: Glasses       Tonometry (Tonopen, 10:53 AM)      Right Left   Pressure 12 11       Pupils      Pupils Dark Light Shape React APD   Right PERRL 2 2 Round Minimal None   Left PERRL 2 2 Round Minimal None       Visual Fields (Counting fingers)      Left Right    Full Full       Extraocular Movement      Right Left    Full Full       Neuro/Psych    Oriented x3: Yes   Mood/Affect: Normal       Dilation    Both eyes: 1.0% Mydriacyl, 2.5% Phenylephrine @ 10:57 AM        Slit Lamp and Fundus Exam    External Exam      Right Left   External Normal Normal       Slit Lamp Exam      Right Left   Lids/Lashes Normal Normal   Conjunctiva/Sclera White and quiet White and quiet   Cornea Clear Clear   Anterior Chamber Deep and quiet Deep and quiet   Iris Round and reactive Round and reactive   Lens Posterior chamber intraocular lens Posterior chamber intraocular lens   Anterior Vitreous Normal Normal       Fundus Exam      Right Left   Posterior Vitreous ,  Posterior vitreous detachment, peripheral vitreous floaters superiorly as well.    Disc Normal    C/D Ratio 0.55 0.6   Macula Retinal pigment epithelial atrophy, intermediate age related macular degeneration, Mottling, Retinal pigment epithelial mottling, Hard drusen, Soft drusen, no macular thickening, no hemorrhage Retinal pigment epithelial mottling, no macular thickening, Hard drusen, no hemorrhage   Vessels ,,, NO DR    Periphery Normal           IMAGING AND PROCEDURES  Imaging and Procedures for 01/17/21  OCT, Retina - OU - Both Eyes       Right Eye Quality was good. Scan locations included subfoveal. Central Foveal Thickness: 261. Progression has improved. Findings include no IRF, abnormal foveal contour, no SRF.   Left Eye Quality was good. Scan locations included subfoveal. Central Foveal Thickness: 275. Progression has been stable. Findings include abnormal foveal contour, retinal drusen , no IRF, no SRF.   Notes No active CNVM, serous retinal detachment subfoveal right eye last visualized February 2021 completely resolved.       Intravitreal Injection, Pharmacologic Agent - OD - Right Eye       Time Out 01/17/2021. 11:53 AM. Confirmed correct patient, procedure, site, and patient consented.   Anesthesia Topical anesthesia was used. Anesthetic medications included Akten 3.5%.   Procedure Preparation included 5% betadine to ocular surface, 10% betadine to eyelids, Tobramycin 0.3%. A 30 gauge needle was used.   Injection:  2.5 mg Bevacizumab (AVASTIN) 2.5mg /0.58mL SOSY   NDC: 53664-403-47, Lot: [id number]   Route: Intravitreal, Site: Right Eye  Post-op Post injection exam found visual acuity of at least counting fingers. The patient tolerated the procedure well. There were no complications. The patient received written and verbal post procedure care education. Post injection medications were not given.  ASSESSMENT/PLAN:  Serous detachment  of retinal pigment epithelium of right eye Most recently visualized as component of CNVM, February 2021, improved overall now  Exudative age-related macular degeneration of right eye with active choroidal neovascularization ([place]) Much less active disease centrally and paracentrally OD currently at 13-month follow-up.  We will repeat injection intravitreal Avastin OD today and again 69-month examination  Diabetes mellitus without complication (HCC) No DR  Intermediate stage nonexudative age-related macular degeneration of both eyes OS stable, no active disease      ICD-10-CM   1. Exudative age-related macular degeneration of right eye with active choroidal neovascularization (HCC)  H35.3211 OCT, Retina - OU - Both Eyes    Intravitreal Injection, Pharmacologic Agent - OD - Right Eye    bevacizumab (AVASTIN) SOSY 2.5 mg  2. Serous detachment of retinal pigment epithelium of right eye  H35.721   3. Diabetes mellitus without complication ([place])  R67.8   4. Intermediate stage nonexudative age-related macular degeneration of both eyes  H35.3132     1.  Wet ARMD has improved nicely on therapy, now at 58-month follow-up.  With risk of recurrence, will treat again today OD with intravitreal Avastin.  Dilate OU next in 3 months  2.  3.  Ophthalmic Meds Ordered this visit:  Meds ordered this encounter  Medications  . bevacizumab (AVASTIN) SOSY 2.5 mg       Return in about 3 months (around 04/19/2021) for DILATE OU, AVASTIN OCT, OD.  There are no Patient Instructions on file for this visit.   Explained the diagnoses, plan, and follow up with the patient and they expressed understanding.  Patient expressed understanding of the importance of proper follow up care.   Clent Demark Rankin M.D. Diseases & Surgery of the Retina and Vitreous Retina & Diabetic [place] 01/17/21     Abbreviations: M myopia (nearsighted); A astigmatism; H hyperopia (farsighted); P presbyopia; Mrx spectacle  prescription;  CTL contact lenses; OD right eye; OS left eye; OU both eyes  XT exotropia; ET esotropia; PEK punctate epithelial keratitis; PEE punctate epithelial erosions; DES dry eye syndrome; MGD meibomian gland dysfunction; ATs artificial tears; PFAT's preservative free artificial tears; [place] nuclear sclerotic cataract; PSC posterior subcapsular cataract; ERM epi-retinal membrane; PVD posterior vitreous detachment; RD retinal detachment; DM diabetes mellitus; DR diabetic retinopathy; NPDR non-proliferative diabetic retinopathy; PDR proliferative diabetic retinopathy; CSME clinically significant macular edema; DME diabetic macular edema; dbh dot blot hemorrhages; CWS cotton wool spot; POAG primary open angle glaucoma; C/D cup-to-disc ratio; HVF humphrey visual field; GVF goldmann visual field; OCT optical coherence tomography; IOP intraocular pressure; BRVO Branch retinal vein occlusion; CRVO central retinal vein occlusion; CRAO central retinal artery occlusion; BRAO branch retinal artery occlusion; RT retinal tear; SB scleral buckle; PPV pars plana vitrectomy; VH Vitreous hemorrhage; PRP panretinal laser photocoagulation; IVK intravitreal kenalog; VMT vitreomacular traction; MH Macular hole;  NVD neovascularization of the disc; NVE neovascularization elsewhere; AREDS age related eye disease study; ARMD age related macular degeneration; POAG primary open angle glaucoma; EBMD epithelial/anterior basement membrane dystrophy; ACIOL anterior chamber intraocular lens; IOL intraocular lens; PCIOL posterior chamber intraocular lens; Phaco/IOL phacoemulsification with intraocular lens placement; [place] photorefractive keratectomy; LASIK laser assisted in situ keratomileusis; HTN hypertension; DM diabetes mellitus; COPD chronic obstructive pulmonary disease

## 2021-01-19 DIAGNOSIS — G629 Polyneuropathy, unspecified: Secondary | ICD-10-CM | POA: Diagnosis not present

## 2021-01-19 DIAGNOSIS — G589 Mononeuropathy, unspecified: Secondary | ICD-10-CM | POA: Diagnosis not present

## 2021-01-19 DIAGNOSIS — G6289 Other specified polyneuropathies: Secondary | ICD-10-CM | POA: Diagnosis not present

## 2021-01-19 DIAGNOSIS — G588 Other specified mononeuropathies: Secondary | ICD-10-CM | POA: Diagnosis not present

## 2021-01-19 DIAGNOSIS — G894 Chronic pain syndrome: Secondary | ICD-10-CM | POA: Diagnosis not present

## 2021-02-10 DIAGNOSIS — E1122 Type 2 diabetes mellitus with diabetic chronic kidney disease: Secondary | ICD-10-CM | POA: Diagnosis not present

## 2021-02-10 DIAGNOSIS — I129 Hypertensive chronic kidney disease with stage 1 through stage 4 chronic kidney disease, or unspecified chronic kidney disease: Secondary | ICD-10-CM | POA: Diagnosis not present

## 2021-02-10 DIAGNOSIS — N182 Chronic kidney disease, stage 2 (mild): Secondary | ICD-10-CM | POA: Diagnosis not present

## 2021-02-16 DIAGNOSIS — I10 Essential (primary) hypertension: Secondary | ICD-10-CM | POA: Diagnosis not present

## 2021-02-16 DIAGNOSIS — Z6832 Body mass index (BMI) 32.0-32.9, adult: Secondary | ICD-10-CM | POA: Diagnosis not present

## 2021-02-16 DIAGNOSIS — G588 Other specified mononeuropathies: Secondary | ICD-10-CM | POA: Diagnosis not present

## 2021-02-16 DIAGNOSIS — G894 Chronic pain syndrome: Secondary | ICD-10-CM | POA: Diagnosis not present

## 2021-02-17 DIAGNOSIS — G588 Other specified mononeuropathies: Secondary | ICD-10-CM | POA: Diagnosis not present

## 2021-02-24 DIAGNOSIS — E538 Deficiency of other specified B group vitamins: Secondary | ICD-10-CM | POA: Diagnosis not present

## 2021-03-30 DIAGNOSIS — G894 Chronic pain syndrome: Secondary | ICD-10-CM | POA: Diagnosis not present

## 2021-03-30 DIAGNOSIS — Z9689 Presence of other specified functional implants: Secondary | ICD-10-CM | POA: Diagnosis not present

## 2021-03-30 DIAGNOSIS — G588 Other specified mononeuropathies: Secondary | ICD-10-CM | POA: Diagnosis not present

## 2021-04-07 DIAGNOSIS — E538 Deficiency of other specified B group vitamins: Secondary | ICD-10-CM | POA: Diagnosis not present

## 2021-04-19 ENCOUNTER — Other Ambulatory Visit: Payer: Self-pay

## 2021-04-19 ENCOUNTER — Ambulatory Visit (INDEPENDENT_AMBULATORY_CARE_PROVIDER_SITE_OTHER): Payer: Medicare HMO | Admitting: Ophthalmology

## 2021-04-19 ENCOUNTER — Encounter (INDEPENDENT_AMBULATORY_CARE_PROVIDER_SITE_OTHER): Payer: Self-pay | Admitting: Ophthalmology

## 2021-04-19 DIAGNOSIS — H353132 Nonexudative age-related macular degeneration, bilateral, intermediate dry stage: Secondary | ICD-10-CM | POA: Diagnosis not present

## 2021-04-19 DIAGNOSIS — H353211 Exudative age-related macular degeneration, right eye, with active choroidal neovascularization: Secondary | ICD-10-CM

## 2021-04-19 DIAGNOSIS — H43813 Vitreous degeneration, bilateral: Secondary | ICD-10-CM

## 2021-04-19 MED ORDER — BEVACIZUMAB 2.5 MG/0.1ML IZ SOSY
2.5000 mg | PREFILLED_SYRINGE | INTRAVITREAL | Status: AC | PRN
  Administered 2021-04-19: 2.5 mg via INTRAVITREAL

## 2021-04-19 NOTE — Progress Notes (Signed)
04/19/2021     CHIEF COMPLAINT Patient presents for Retina Follow Up (3 Mo F/U OU, poss Avastin OD//Pt c/o floaters OD "like mosquitos everywhere," and pt sts these are stable since last visit. Pt denies new floaters. Pt c/o needing to blink for VA to clear OU. Pt denies dryness OU./LBS: 110 this AM/A1c: unknown)   HISTORY OF PRESENT ILLNESS: Kevin Suarez is a 80 y.o. male who presents to the clinic today for:   HPI    Retina Follow Up    Diagnosis: Wet AMD   Laterality: right eye   Onset: 3 months ago   Severity: mild   Duration: 3 months   Course: stable   Comments: 3 Mo F/U OU, poss Avastin OD  Pt c/o floaters OD "like mosquitos everywhere," and pt sts these are stable since last visit. Pt denies new floaters. Pt c/o needing to blink for VA to clear OU. Pt denies dryness OU. LBS: 110 this AM A1c: unknown       Last edited by Rockie Neighbours, [place] on 04/19/2021 10:29 AM. (History)      Referring physician: Haywood Pao, MD [place],  [place] [id number]  HISTORICAL INFORMATION:   Selected notes from the [facility]    Lab Results  Component Value Date   HGBA1C 7.0 (H) 07/14/2020     CURRENT MEDICATIONS: Current Outpatient Medications (Ophthalmic Drugs)  Medication Sig  . hydroxypropyl methylcellulose / hypromellose (ISOPTO TEARS / GONIOVISC) 2.5 % ophthalmic solution Place 1 drop into both eyes daily as needed for dry eyes.   No current facility-administered medications for this visit. (Ophthalmic Drugs)   Current Outpatient Medications (Other)  Medication Sig  . finasteride (PROSCAR) 5 MG tablet Take 5 mg by mouth daily.  Marland Kitchen losartan (COZAAR) 25 MG tablet Take 50 mg by mouth daily.   . metFORMIN (GLUCOPHAGE) 500 MG tablet Take 500 mg by mouth 2 (two) times daily with a meal.   . omeprazole (PRILOSEC) 40 MG capsule Take 40 mg by mouth daily before breakfast.  . pantoprazole (PROTONIX) 40 MG tablet Take 40 mg by mouth every evening.    . tamsulosin (FLOMAX) 0.4 MG CAPS capsule Take 0.4 mg by mouth at bedtime.  . traMADol (ULTRAM) 50 MG tablet Take 1 tablet (50 mg total) by mouth every 6 (six) hours as needed.   No current facility-administered medications for this visit. (Other)      REVIEW OF SYSTEMS:    ALLERGIES Allergies  Allergen Reactions  . Prednisone Other (See Comments)    Doesn't like side effects of prednisone.     PAST MEDICAL HISTORY Past Medical History:  Diagnosis Date  . Arthritis    back - steroid injection  . Bladder stone   . BPH (benign prostatic hypertrophy)   . Frequency of urination   . GERD (gastroesophageal reflux disease)   . 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 Hearing loss 2020   no hearing aids  . History of kidney stones    surgery to remove stones  . Hyperlipidemia   . Hypertension   . Lung nodules    LAST CT 2012  PER DR WRIGHT NOTE NO FURTHER WORK-UP (PULMOLOGIST)  . Type 2 diabetes mellitus ([place])   . Wears dentures    full upper  . Wears glasses   . Wears partial dentures    lower   Past Surgical History:  Procedure Laterality Date  . CATARACT EXTRACTION W/ INTRAOCULAR LENS  IMPLANT,  BILATERAL    . CYSTOSCOPY WITH LITHOLAPAXY N/A 11/28/2013   Procedure: CYSTOSCOPY WITH stone retrieval;  Surgeon: Claybon Jabs, MD;  Location: [facility];  Service: Urology;  Laterality: N/A;  . CYSTOSCOPY WITH LITHOLAPAXY N/A 10/15/2015   Procedure: CYSTOSCOPY WITH LITHOLAPAXY, HOLMIUM LASER LITHOTRIPSY tranurethral excision of prostate;  Surgeon: [name], MD;  Location: [facility];  Service: Urology;  Laterality: N/A;  . EXTRACORPOREAL SHOCK WAVE LITHOTRIPSY Right 05-13-2015  . HOLMIUM LASER APPLICATION N/A 16/11/958   Procedure: HOLMIUM LASER APPLICATION;  Surgeon: [name], MD;  Location: [facility];  Service: Urology;  Laterality: N/A;  . TOTAL HIP ARTHROPLASTY Bilateral 2000  &  2005  . TRANSURETHRAL RESECTION OF PROSTATE   11/28/2013   Procedure: TRANSURETHRAL RESECTION OF THE PROSTATE WITH  BUTTON GYRUS INSTRUMENTS;  Surgeon: Claybon Jabs, MD;  Location: [facility];  Service: Urology;;  . WRIST SURGERY Left 2005   INJURY  . XI ROBOTIC ASSISTED INGUINAL HERNIA REPAIR WITH MESH Left 07/20/2020   Procedure: ROBOTIC LEFT INGUINAL HERNIA WITH MESH USING PROGRIP LAPAROSCOPIC SELF-FIXATING MESH 16cm x 12cm;  Surgeon: Ralene Ok, MD;  Location: [facility];  Service: General;  Laterality: Left;    FAMILY HISTORY Family History  Problem Relation Age of Onset  . Cancer Mother        Unknown  . Cancer Maternal Grandmother   . Heart attack Paternal Grandfather   . Coronary artery disease Father   . Diabetes Father     SOCIAL HISTORY Social History   Tobacco Use  . Smoking status: Former Smoker    Packs/day: 2.00    Years: 30.00    Pack years: 60.00    Types: Cigarettes    Quit date: 11/14/1995    Years since quitting: 25.4  . Smokeless tobacco: Never Used  Vaping Use  . Vaping Use: Never used  Substance Use Topics  . Alcohol use: No  . Drug use: No         OPHTHALMIC EXAM: Base Eye Exam    Visual Acuity (ETDRS)      Right Left   Dist cc 20/30 20/20   Dist ph cc 20/25 -2    Correction: Glasses       Tonometry (Tonopen, 10:31 AM)      Right Left   Pressure 13 10       Pupils      Pupils Dark Light Shape React APD   Right PERRL 3 2 Round Slow None   Left PERRL 3 2 Round Slow None       Visual Fields (Counting fingers)      Left Right    Full Full       Extraocular Movement      Right Left    Full Full       Neuro/Psych    Oriented x3: Yes   Mood/Affect: Normal       Dilation    Both eyes: 1.0% Mydriacyl, 2.5% Phenylephrine @ 10:31 AM        Slit Lamp and Fundus Exam    External Exam      Right Left   External Normal Normal       Slit Lamp Exam      Right Left   Lids/Lashes Normal Normal   Conjunctiva/Sclera White and quiet White and quiet    Cornea Clear Clear   Anterior Chamber Deep and quiet Deep and quiet   Iris Round  and reactive Round and reactive   Lens Posterior chamber intraocular lens Posterior chamber intraocular lens   Anterior Vitreous Normal Normal       Fundus Exam      Right Left   Posterior Vitreous , Posterior vitreous detachment, peripheral vitreous floaters superiorly as well.    Disc Normal    C/D Ratio 0.55 0.6   Macula Retinal pigment epithelial atrophy, intermediate age related macular degeneration, Mottling, Retinal pigment epithelial mottling, Hard drusen, Soft drusen, no macular thickening, no hemorrhage, Foveal hypoplasia Retinal pigment epithelial mottling, no macular thickening, Hard drusen, no hemorrhage, Intermediate age related macular degeneration   Vessels ,,, NO DR    Periphery Normal           IMAGING AND PROCEDURES  Imaging and Procedures for 04/19/21  OCT, Retina - OU - Both Eyes       Right Eye Quality was good. Scan locations included subfoveal. Central Foveal Thickness: 256. Progression has improved. Findings include no IRF, abnormal foveal contour, no SRF.   Left Eye Quality was good. Scan locations included subfoveal. Central Foveal Thickness: 275. Progression has been stable. Findings include abnormal foveal contour, retinal drusen , no IRF, no SRF.   Notes No active CNVM, serous retinal detachment subfoveal right eye last visualized February 2021 with small lingering pigment epithelial detachment superotemporal to the FAZ controlled.  Today at 74-month follow-up OD.  We will repeat injection today to maintain and prevent progression.       Intravitreal Injection, Pharmacologic Agent - OD - Right Eye       Time Out 04/19/2021. 11:39 AM. Confirmed correct patient, procedure, site, and patient consented.   Anesthesia Topical anesthesia was used. Anesthetic medications included Akten 3.5%.   Procedure Preparation included 5% betadine to ocular surface, 10% betadine  to eyelids, Tobramycin 0.3%. A 30 gauge needle was used.   Injection:  2.5 mg Bevacizumab (AVASTIN) 2.5mg /0.48mL SOSY   NDC: 96295-284-13, Lot: [id number]   Route: Intravitreal, Site: Right Eye  Post-op Post injection exam found visual acuity of at least counting fingers. The patient tolerated the procedure well. There were no complications. The patient received written and verbal post procedure care education. Post injection medications were not given.                 ASSESSMENT/PLAN:  Exudative age-related macular degeneration of right eye with active choroidal neovascularization (HCC) At 81-month follow-up, small serous detachment vein superotemporally, not an active CNVM.  We will repeat injection today to maintain.  Follow-up examination OU in 4 months  Intermediate stage nonexudative age-related macular degeneration of both eyes No signs of CNVM OS  Posterior vitreous detachment of both eyes  The nature of posterior vitreous detachment was discussed with the patient as well as its physiology, its age prevalence, and its possible implication regarding retinal breaks and detachment.  An informational brochure was offered to the patient.  All the patient's questions were answered.  The patient was asked to return if new or different flashes or floaters develops.   Patient was instructed to contact office immediately if any new changes were noticed. I explained to the patient that vitreous inside the eye is similar to jello inside a bowl. As the jello melts it can start to pull away from the bowl, similarly the vitreous throughout our lives can begin to pull away from the retina. That process is called a posterior vitreous detachment. In some cases, the vitreous can tug hard enough on the retina  to form a retinal tear. I discussed with the patient the signs and symptoms of a retinal detachment.  Do not rub the eye.        ICD-10-CM   1. Exudative age-related macular degeneration of  right eye with active choroidal neovascularization (HCC)  H35.3211 OCT, Retina - OU - Both Eyes    Intravitreal Injection, Pharmacologic Agent - OD - Right Eye    bevacizumab (AVASTIN) SOSY 2.5 mg  2. Intermediate stage nonexudative age-related macular degeneration of both eyes  H35.3132   3. Posterior vitreous detachment of both eyes  H43.813     1.  CNVM OD, now quiescent after over 1 year of therapy.  At 15-month follow-up today.  Small serous pigment epithelial detachment remains.  Repeat injection intravitreal Avastin OD today examination next OU in 4 months  2.  Plan injection OD next  3.  Ophthalmic Meds Ordered this visit:  Meds ordered this encounter  Medications  . bevacizumab (AVASTIN) SOSY 2.5 mg       Return in about 4 months (around 08/19/2021) for DILATE OU, COLOR FP, OCT.  There are no Patient Instructions on file for this visit.   Explained the diagnoses, plan, and follow up with the patient and they expressed understanding.  Patient expressed understanding of the importance of proper follow up care.   Clent Demark Monay Houlton M.D. Diseases & Surgery of the Retina and Vitreous Retina & Diabetic [place] 04/19/21     Abbreviations: M myopia (nearsighted); A astigmatism; H hyperopia (farsighted); P presbyopia; Mrx spectacle prescription;  CTL contact lenses; OD right eye; OS left eye; OU both eyes  XT exotropia; ET esotropia; PEK punctate epithelial keratitis; PEE punctate epithelial erosions; DES dry eye syndrome; MGD meibomian gland dysfunction; ATs artificial tears; PFAT's preservative free artificial tears; [place] nuclear sclerotic cataract; PSC posterior subcapsular cataract; ERM epi-retinal membrane; PVD posterior vitreous detachment; RD retinal detachment; DM diabetes mellitus; DR diabetic retinopathy; NPDR non-proliferative diabetic retinopathy; PDR proliferative diabetic retinopathy; CSME clinically significant macular edema; DME diabetic macular edema; dbh dot blot  hemorrhages; CWS cotton wool spot; POAG primary open angle glaucoma; C/D cup-to-disc ratio; HVF humphrey visual field; GVF goldmann visual field; OCT optical coherence tomography; IOP intraocular pressure; BRVO Branch retinal vein occlusion; CRVO central retinal vein occlusion; CRAO central retinal artery occlusion; BRAO branch retinal artery occlusion; RT retinal tear; SB scleral buckle; PPV pars plana vitrectomy; VH Vitreous hemorrhage; PRP panretinal laser photocoagulation; IVK intravitreal kenalog; VMT vitreomacular traction; MH Macular hole;  NVD neovascularization of the disc; NVE neovascularization elsewhere; AREDS age related eye disease study; ARMD age related macular degeneration; POAG primary open angle glaucoma; EBMD epithelial/anterior basement membrane dystrophy; ACIOL anterior chamber intraocular lens; IOL intraocular lens; PCIOL posterior chamber intraocular lens; Phaco/IOL phacoemulsification with intraocular lens placement; [place] photorefractive keratectomy; LASIK laser assisted in situ keratomileusis; HTN hypertension; DM diabetes mellitus; COPD chronic obstructive pulmonary disease

## 2021-04-19 NOTE — Assessment & Plan Note (Signed)

## 2021-04-19 NOTE — Assessment & Plan Note (Signed)
At 80-month follow-up, small serous detachment vein superotemporally, not an active CNVM.  We will repeat injection today to maintain.  Follow-up examination OU in 4 months

## 2021-04-19 NOTE — Assessment & Plan Note (Signed)
No signs of CNVM OS

## 2021-05-10 DIAGNOSIS — R3912 Poor urinary stream: Secondary | ICD-10-CM | POA: Diagnosis not present

## 2021-05-10 DIAGNOSIS — E538 Deficiency of other specified B group vitamins: Secondary | ICD-10-CM | POA: Diagnosis not present

## 2021-05-10 DIAGNOSIS — R972 Elevated prostate specific antigen [PSA]: Secondary | ICD-10-CM | POA: Diagnosis not present

## 2021-05-10 DIAGNOSIS — R350 Frequency of micturition: Secondary | ICD-10-CM | POA: Diagnosis not present

## 2021-05-10 DIAGNOSIS — N401 Enlarged prostate with lower urinary tract symptoms: Secondary | ICD-10-CM | POA: Diagnosis not present

## 2021-05-12 DIAGNOSIS — E1122 Type 2 diabetes mellitus with diabetic chronic kidney disease: Secondary | ICD-10-CM | POA: Diagnosis not present

## 2021-05-12 DIAGNOSIS — N182 Chronic kidney disease, stage 2 (mild): Secondary | ICD-10-CM | POA: Diagnosis not present

## 2021-05-12 DIAGNOSIS — K219 Gastro-esophageal reflux disease without esophagitis: Secondary | ICD-10-CM | POA: Diagnosis not present

## 2021-05-12 DIAGNOSIS — I129 Hypertensive chronic kidney disease with stage 1 through stage 4 chronic kidney disease, or unspecified chronic kidney disease: Secondary | ICD-10-CM | POA: Diagnosis not present

## 2021-06-14 DIAGNOSIS — E538 Deficiency of other specified B group vitamins: Secondary | ICD-10-CM | POA: Diagnosis not present

## 2021-07-14 DIAGNOSIS — E538 Deficiency of other specified B group vitamins: Secondary | ICD-10-CM | POA: Diagnosis not present

## 2021-07-27 DIAGNOSIS — R809 Proteinuria, unspecified: Secondary | ICD-10-CM | POA: Diagnosis not present

## 2021-07-27 DIAGNOSIS — I129 Hypertensive chronic kidney disease with stage 1 through stage 4 chronic kidney disease, or unspecified chronic kidney disease: Secondary | ICD-10-CM | POA: Diagnosis not present

## 2021-07-27 DIAGNOSIS — M179 Osteoarthritis of knee, unspecified: Secondary | ICD-10-CM | POA: Diagnosis not present

## 2021-07-27 DIAGNOSIS — E538 Deficiency of other specified B group vitamins: Secondary | ICD-10-CM | POA: Diagnosis not present

## 2021-07-27 DIAGNOSIS — E1129 Type 2 diabetes mellitus with other diabetic kidney complication: Secondary | ICD-10-CM | POA: Diagnosis not present

## 2021-07-27 DIAGNOSIS — K219 Gastro-esophageal reflux disease without esophagitis: Secondary | ICD-10-CM | POA: Diagnosis not present

## 2021-07-27 DIAGNOSIS — E669 Obesity, unspecified: Secondary | ICD-10-CM | POA: Diagnosis not present

## 2021-07-27 DIAGNOSIS — N182 Chronic kidney disease, stage 2 (mild): Secondary | ICD-10-CM | POA: Diagnosis not present

## 2021-07-27 DIAGNOSIS — Z23 Encounter for immunization: Secondary | ICD-10-CM | POA: Diagnosis not present

## 2021-07-27 DIAGNOSIS — N401 Enlarged prostate with lower urinary tract symptoms: Secondary | ICD-10-CM | POA: Diagnosis not present

## 2021-07-27 DIAGNOSIS — R972 Elevated prostate specific antigen [PSA]: Secondary | ICD-10-CM | POA: Diagnosis not present

## 2021-08-18 DIAGNOSIS — E538 Deficiency of other specified B group vitamins: Secondary | ICD-10-CM | POA: Diagnosis not present

## 2021-08-23 ENCOUNTER — Ambulatory Visit (INDEPENDENT_AMBULATORY_CARE_PROVIDER_SITE_OTHER): Payer: Medicare HMO | Admitting: Ophthalmology

## 2021-08-23 ENCOUNTER — Other Ambulatory Visit: Payer: Self-pay

## 2021-08-23 ENCOUNTER — Encounter (INDEPENDENT_AMBULATORY_CARE_PROVIDER_SITE_OTHER): Payer: Medicare HMO | Admitting: Ophthalmology

## 2021-08-23 ENCOUNTER — Encounter (INDEPENDENT_AMBULATORY_CARE_PROVIDER_SITE_OTHER): Payer: Self-pay | Admitting: Ophthalmology

## 2021-08-23 DIAGNOSIS — H35721 Serous detachment of retinal pigment epithelium, right eye: Secondary | ICD-10-CM | POA: Diagnosis not present

## 2021-08-23 DIAGNOSIS — H353132 Nonexudative age-related macular degeneration, bilateral, intermediate dry stage: Secondary | ICD-10-CM

## 2021-08-23 DIAGNOSIS — H353211 Exudative age-related macular degeneration, right eye, with active choroidal neovascularization: Secondary | ICD-10-CM | POA: Diagnosis not present

## 2021-08-23 DIAGNOSIS — E119 Type 2 diabetes mellitus without complications: Secondary | ICD-10-CM

## 2021-08-23 NOTE — Progress Notes (Signed)
08/23/2021     CHIEF COMPLAINT Patient presents for  Chief Complaint  Patient presents with   Retina Follow Up    3 Mo F/U OU, poss Avastin OD  Pt c/o floaters OD "like mosquitos everywhere," and pt sts these are stable since last visit. Pt denies new floaters. Pt c/o needing to blink for VA to clear OU. Pt denies dryness OU. LBS: 110 this AM A1c: unknown      HISTORY OF PRESENT ILLNESS: Kevin Suarez is a 80 y.o. male who presents to the clinic today for:   HPI     Retina Follow Up   Patient presents with  Wet AMD.  In right eye.  This started 4 months ago.  Severity is mild.  Duration of 4 months.  Since onset it is stable. Additional comments: 3 Mo F/U OU, poss Avastin OD  Pt c/o floaters OD "like mosquitos everywhere," and pt sts these are stable since last visit. Pt denies new floaters. Pt c/o needing to blink for VA to clear OU. Pt denies dryness OU. LBS: 110 this AM A1c: unknown        Comments   4 mos fu ou oct fp Patient states vision is stable and unchanged since last visit. Denies any new floaters or FOL. LBS: 116 this morning A1C: Pt states "my doctor said he was going to cut my medicine back, he said I am doing good. I don't know my A1C. I had a physical not long ago but I do not remember."      Last edited by Laurin Coder on 08/23/2021 10:25 AM.      Referring physician: Haywood Pao, MD [place],  [place] [address]  HISTORICAL INFORMATION:   Selected notes from the [facility]    Lab Results  Component Value Date   HGBA1C 7.0 (H) 07/14/2020     CURRENT MEDICATIONS: Current Outpatient Medications (Ophthalmic Drugs)  Medication Sig   hydroxypropyl methylcellulose / hypromellose (ISOPTO TEARS / GONIOVISC) 2.5 % ophthalmic solution Place 1 drop into both eyes daily as needed for dry eyes.   No current facility-administered medications for this visit. (Ophthalmic Drugs)   Current Outpatient Medications  (Other)  Medication Sig   finasteride (PROSCAR) 5 MG tablet Take 5 mg by mouth daily.   losartan (COZAAR) 25 MG tablet Take 50 mg by mouth daily.    metFORMIN (GLUCOPHAGE) 500 MG tablet Take 500 mg by mouth 2 (two) times daily with a meal.    omeprazole (PRILOSEC) 40 MG capsule Take 40 mg by mouth daily before breakfast.   pantoprazole (PROTONIX) 40 MG tablet Take 40 mg by mouth every evening.    tamsulosin (FLOMAX) 0.4 MG CAPS capsule Take 0.4 mg by mouth at bedtime.   No current facility-administered medications for this visit. (Other)      REVIEW OF SYSTEMS:    ALLERGIES Allergies  Allergen Reactions   Prednisone Other (See Comments)    Doesn't like side effects of prednisone.     PAST MEDICAL HISTORY Past Medical History:  Diagnosis Date   Arthritis    back - steroid injection   Bladder stone    BPH (benign prostatic hypertrophy)    Frequency of urination    GERD (gastroesophageal reflux disease)    H/O asbestos exposure    Hearing loss 2020   no hearing aids   History of kidney stones    surgery to remove stones   Hyperlipidemia  Hypertension    Lung nodules    LAST CT 2012  PER DR WRIGHT NOTE NO FURTHER WORK-UP (PULMOLOGIST)   Type 2 diabetes mellitus ([place])    Wears dentures    full upper   Wears glasses    Wears partial dentures    lower   Past Surgical History:  Procedure Laterality Date   CATARACT EXTRACTION W/ INTRAOCULAR LENS  IMPLANT, BILATERAL     CYSTOSCOPY WITH LITHOLAPAXY N/A 11/28/2013   Procedure: CYSTOSCOPY WITH stone retrieval;  Surgeon: Claybon Jabs, MD;  Location[facility];  Service: Urology;  Laterality: N/A;   CYSTOSCOPY WITH LITHOLAPAXY N/A 10/15/2015   Procedure: CYSTOSCOPY WITH LITHOLAPAXY, HOLMIUM LASER LITHOTRIPSY tranurethral excision of prostate;  Surgeon: [name], MD;  Location: [facility];  Service: Urology;  Laterality: N/A;   EXTRACORPOREAL SHOCK WAVE LITHOTRIPSY Right 05-13-2015    HOLMIUM LASER APPLICATION N/A 63/06/9372   Procedure: HOLMIUM LASER APPLICATION;  Surgeon: [name], MD;  Location: [facility];  Service: Urology;  Laterality: N/A;   TOTAL HIP ARTHROPLASTY Bilateral 2000  &  2005   TRANSURETHRAL RESECTION OF PROSTATE  11/28/2013   Procedure: TRANSURETHRAL RESECTION OF THE PROSTATE WITH  BUTTON GYRUS INSTRUMENTS;  Surgeon: Claybon Jabs, MD;  Location: [facility];  Service: Urology;;   WRIST SURGERY Left 2005   INJURY   XI ROBOTIC ASSISTED INGUINAL HERNIA REPAIR WITH MESH Left 07/20/2020   Procedure: ROBOTIC LEFT INGUINAL HERNIA WITH MESH USING PROGRIP LAPAROSCOPIC SELF-FIXATING MESH 16cm x 12cm;  Surgeon: Ralene Ok, MD;  [facility]: [facility];  Service: General;  Laterality: Left;    FAMILY HISTORY Family History  Problem Relation Age of Onset   Cancer Mother        Unknown   Cancer Maternal Grandmother    Heart attack Paternal Grandfather    Coronary artery disease Father    Diabetes Father     SOCIAL HISTORY Social History   Tobacco Use   Smoking status: Former    Packs/day: 2.00    Years: 30.00    Pack years: 60.00    Types: Cigarettes    Quit date: 11/14/1995    Years since quitting: 25.7   Smokeless tobacco: Never  Vaping Use   Vaping Use: Never used  Substance Use Topics   Alcohol use: No   Drug use: No         OPHTHALMIC EXAM:  Base Eye Exam     Visual Acuity (ETDRS)       Right Left   Dist [place] 20/40 20/25 -1   Dist ph [place] 20/30 -2          Tonometry (Tonopen, 10:28 AM)       Right Left   Pressure 17 15         Pupils       Pupils Dark Light Shape React APD   Right PERRL 2.5 2 Round Minimal None   Left PERRL 2.5 2 Round Minimal None         Visual Fields (Counting fingers)       Left Right    Full Full         Extraocular Movement       Right Left    Full Full         Neuro/Psych     Oriented x3: Yes   Mood/Affect: Normal         Dilation     Both  eyes: 1.0% Mydriacyl, 2.5% Phenylephrine @ 10:28 AM           Slit Lamp and Fundus Exam     External Exam       Right Left   External Normal Normal         Slit Lamp Exam       Right Left   Lids/Lashes Normal Normal   Conjunctiva/Sclera White and quiet White and quiet   Cornea Clear Clear   Anterior Chamber Deep and quiet Deep and quiet   Iris Round and reactive Round and reactive   Lens Posterior chamber intraocular lens Posterior chamber intraocular lens   Anterior Vitreous Normal Normal         Fundus Exam       Right Left   Posterior Vitreous , Posterior vitreous detachment, peripheral vitreous floaters superiorly as well.    Disc Normal    C/D Ratio 0.55 0.6   Macula Retinal pigment epithelial atrophy, intermediate age related macular degeneration, Mottling, Retinal pigment epithelial mottling, Hard drusen, Soft drusen, no macular thickening, no hemorrhage, Foveal hypoplasia Retinal pigment epithelial mottling, no macular thickening, Hard drusen, no hemorrhage, Intermediate age related macular degeneration   Vessels ,,, NO DR    Periphery Normal             IMAGING AND PROCEDURES  Imaging and Procedures for 08/23/21  OCT, Retina - OU - Both Eyes       Right Eye Quality was good. Scan locations included subfoveal. Central Foveal Thickness: 251. Progression has improved. Findings include no IRF, abnormal foveal contour, no SRF.   Left Eye Quality was good. Scan locations included subfoveal. Central Foveal Thickness: 277. Progression has been stable. Findings include abnormal foveal contour, retinal drusen , no IRF, no SRF.   Notes No active CNVM, serous retinal detachment subfoveal right eye last visualized February 2021 with small lingering pigment epithelial detachment superotemporal to the FAZ controlled.  Today at 56-month follow-up OD.  We will repeat injection today to maintain and prevent progression.     Color Fundus Photography Optos - OU -  Both Eyes       Right Eye Progression has been stable. Disc findings include normal observations, increased cup to disc ratio. Macula : drusen. Vessels : normal observations.   Left Eye Progression has been stable. Disc findings include normal observations, increased cup to disc ratio. Macula : drusen. Vessels : normal observations.   Notes Incidental benign reticulated RPE degeneration retinal periphery OU clear media OU note no new progressions of AMD             ASSESSMENT/PLAN:  Serous detachment of retinal pigment epithelium of right eye Condition resolved OD no sign of recurrence  Exudative age-related macular degeneration of right eye with active choroidal neovascularization (HCC) History of wet ARMD OD, with last injection some 4 months previous.  No sign of recurrence we will continue to monitor and observe  Diabetes mellitus without complication (HCC) No detectable diabetic retinopathy  Intermediate stage nonexudative age-related macular degeneration of both eyes No sign of CNVM OU     ICD-10-CM   1. Exudative age-related macular degeneration of right eye with active choroidal neovascularization (HCC)  H35.3211 OCT, Retina - OU - Both Eyes    Color Fundus Photography Optos - OU - Both Eyes    2. Serous detachment of retinal pigment epithelium of right eye  H35.721     3. Diabetes mellitus without complication ([place])  X41.2  4. Intermediate stage nonexudative age-related macular degeneration of both eyes  H35.3132       1.  OU look great, no sign of CNVM.  OU D with no sign of recurrence of CNVM now at 4 months post most recent injection we will continue to monitor and observe  2.  3.  Ophthalmic Meds Ordered this visit:  No orders of the defined types were placed in this encounter.      Return in about 3 months (around 11/23/2021) for DILATE OU, OCT, COLOR FP.  Patient Instructions  Patient was advised to check [place] Grid daily and return  immediately if changes are noted. Instructions on using the grid were given to the patient.   Patient to report any new onset visual acuity declines or distortions   Explained the diagnoses, plan, and follow up with the patient and they expressed understanding.  Patient expressed understanding of the importance of proper follow up care.   Clent Demark Amon Costilla M.D. Diseases & Surgery of the Retina and Vitreous Retina & Diabetic [place] 08/23/21     Abbreviations: M myopia (nearsighted); A astigmatism; H hyperopia (farsighted); P presbyopia; Mrx spectacle prescription;  CTL contact lenses; OD right eye; OS left eye; OU both eyes  XT exotropia; ET esotropia; PEK punctate epithelial keratitis; PEE punctate epithelial erosions; DES dry eye syndrome; MGD meibomian gland dysfunction; ATs artificial tears; PFAT's preservative free artificial tears; [place] nuclear sclerotic cataract; PSC posterior subcapsular cataract; ERM epi-retinal membrane; PVD posterior vitreous detachment; RD retinal detachment; DM diabetes mellitus; DR diabetic retinopathy; NPDR non-proliferative diabetic retinopathy; PDR proliferative diabetic retinopathy; CSME clinically significant macular edema; DME diabetic macular edema; dbh dot blot hemorrhages; CWS cotton wool spot; POAG primary open angle glaucoma; C/D cup-to-disc ratio; HVF humphrey visual field; GVF goldmann visual field; OCT optical coherence tomography; IOP intraocular pressure; BRVO Branch retinal vein occlusion; CRVO central retinal vein occlusion; CRAO central retinal artery occlusion; BRAO branch retinal artery occlusion; RT retinal tear; SB scleral buckle; PPV pars plana vitrectomy; VH Vitreous hemorrhage; PRP panretinal laser photocoagulation; IVK intravitreal kenalog; VMT vitreomacular traction; MH Macular hole;  NVD neovascularization of the disc; NVE neovascularization elsewhere; AREDS age related eye disease study; ARMD age related macular degeneration; POAG primary  open angle glaucoma; EBMD epithelial/anterior basement membrane dystrophy; ACIOL anterior chamber intraocular lens; IOL intraocular lens; PCIOL posterior chamber intraocular lens; Phaco/IOL phacoemulsification with intraocular lens placement; [place] photorefractive keratectomy; LASIK laser assisted in situ keratomileusis; HTN hypertension; DM diabetes mellitus; COPD chronic obstructive pulmonary disease

## 2021-08-23 NOTE — Assessment & Plan Note (Signed)
History of wet ARMD OD, with last injection some 4 months previous.  No sign of recurrence we will continue to monitor and observe

## 2021-08-23 NOTE — Assessment & Plan Note (Signed)
No sign of CNVM OU 

## 2021-08-23 NOTE — Patient Instructions (Signed)
Patient was advised to check [place] Grid daily and return immediately if changes are noted. Instructions on using the grid were given to the patient.   Patient to report any new onset visual acuity declines or distortions

## 2021-08-23 NOTE — Assessment & Plan Note (Signed)
No detectable diabetic retinopathy 

## 2021-08-23 NOTE — Assessment & Plan Note (Signed)
Condition resolved OD no sign of recurrence

## 2021-09-12 DIAGNOSIS — C44311 Basal cell carcinoma of skin of nose: Secondary | ICD-10-CM | POA: Diagnosis not present

## 2021-09-12 DIAGNOSIS — L738 Other specified follicular disorders: Secondary | ICD-10-CM | POA: Diagnosis not present

## 2021-09-12 DIAGNOSIS — D492 Neoplasm of unspecified behavior of bone, soft tissue, and skin: Secondary | ICD-10-CM | POA: Diagnosis not present

## 2021-09-15 DIAGNOSIS — E538 Deficiency of other specified B group vitamins: Secondary | ICD-10-CM | POA: Diagnosis not present

## 2021-09-26 DIAGNOSIS — R42 Dizziness and giddiness: Secondary | ICD-10-CM | POA: Diagnosis not present

## 2021-09-26 DIAGNOSIS — R55 Syncope and collapse: Secondary | ICD-10-CM | POA: Diagnosis not present

## 2021-09-26 DIAGNOSIS — E1129 Type 2 diabetes mellitus with other diabetic kidney complication: Secondary | ICD-10-CM | POA: Diagnosis not present

## 2021-09-26 DIAGNOSIS — M545 Low back pain, unspecified: Secondary | ICD-10-CM | POA: Diagnosis not present

## 2021-09-26 DIAGNOSIS — H547 Unspecified visual loss: Secondary | ICD-10-CM | POA: Diagnosis not present

## 2021-09-26 DIAGNOSIS — E7800 Pure hypercholesterolemia, unspecified: Secondary | ICD-10-CM | POA: Diagnosis not present

## 2021-09-26 DIAGNOSIS — I951 Orthostatic hypotension: Secondary | ICD-10-CM | POA: Diagnosis not present

## 2021-09-28 ENCOUNTER — Other Ambulatory Visit: Payer: Self-pay | Admitting: Adult Health

## 2021-09-28 ENCOUNTER — Ambulatory Visit
Admission: RE | Admit: 2021-09-28 | Discharge: 2021-09-28 | Disposition: A | Discharge status: Home / Self Care | Payer: Medicare HMO | Source: Ambulatory Visit | Attending: Adult Health | Admitting: Adult Health

## 2021-09-28 DIAGNOSIS — R42 Dizziness and giddiness: Secondary | ICD-10-CM

## 2021-09-28 DIAGNOSIS — R55 Syncope and collapse: Secondary | ICD-10-CM

## 2021-09-28 DIAGNOSIS — H547 Unspecified visual loss: Secondary | ICD-10-CM

## 2021-09-28 MED ORDER — IOPAMIDOL (ISOVUE-370) INJECTION 76%
60.0000 mL | Freq: Once | INTRAVENOUS | Status: AC | PRN
  Administered 2021-09-28: 60 mL via INTRAVENOUS

## 2021-09-29 ENCOUNTER — Ambulatory Visit (HOSPITAL_COMMUNITY)
Admission: RE | Admit: 2021-09-29 | Discharge: 2021-09-29 | Disposition: A | Discharge status: Home / Self Care | Payer: Medicare HMO | Source: Ambulatory Visit | Attending: Internal Medicine | Admitting: Internal Medicine

## 2021-09-29 ENCOUNTER — Other Ambulatory Visit: Payer: Self-pay

## 2021-09-29 ENCOUNTER — Other Ambulatory Visit (HOSPITAL_COMMUNITY): Payer: Self-pay | Admitting: Internal Medicine

## 2021-09-29 DIAGNOSIS — I6529 Occlusion and stenosis of unspecified carotid artery: Secondary | ICD-10-CM | POA: Diagnosis not present

## 2021-09-30 ENCOUNTER — Encounter (HOSPITAL_COMMUNITY): Payer: Medicare HMO

## 2021-10-03 DIAGNOSIS — H40012 Open angle with borderline findings, low risk, left eye: Secondary | ICD-10-CM | POA: Diagnosis not present

## 2021-10-03 DIAGNOSIS — H524 Presbyopia: Secondary | ICD-10-CM | POA: Diagnosis not present

## 2021-10-03 DIAGNOSIS — H353121 Nonexudative age-related macular degeneration, left eye, early dry stage: Secondary | ICD-10-CM | POA: Diagnosis not present

## 2021-10-03 DIAGNOSIS — H401212 Low-tension glaucoma, right eye, moderate stage: Secondary | ICD-10-CM | POA: Diagnosis not present

## 2021-10-03 DIAGNOSIS — E119 Type 2 diabetes mellitus without complications: Secondary | ICD-10-CM | POA: Diagnosis not present

## 2021-10-03 DIAGNOSIS — H353211 Exudative age-related macular degeneration, right eye, with active choroidal neovascularization: Secondary | ICD-10-CM | POA: Diagnosis not present

## 2021-10-11 DIAGNOSIS — Z9689 Presence of other specified functional implants: Secondary | ICD-10-CM | POA: Diagnosis not present

## 2021-10-11 DIAGNOSIS — G894 Chronic pain syndrome: Secondary | ICD-10-CM | POA: Diagnosis not present

## 2021-10-11 DIAGNOSIS — M461 Sacroiliitis, not elsewhere classified: Secondary | ICD-10-CM | POA: Diagnosis not present

## 2021-10-20 ENCOUNTER — Ambulatory Visit: Payer: Self-pay | Admitting: Cardiology

## 2021-11-02 DIAGNOSIS — E538 Deficiency of other specified B group vitamins: Secondary | ICD-10-CM | POA: Diagnosis not present

## 2021-11-09 DIAGNOSIS — R972 Elevated prostate specific antigen [PSA]: Secondary | ICD-10-CM | POA: Diagnosis not present

## 2021-11-16 DIAGNOSIS — L821 Other seborrheic keratosis: Secondary | ICD-10-CM | POA: Diagnosis not present

## 2021-11-16 DIAGNOSIS — L814 Other melanin hyperpigmentation: Secondary | ICD-10-CM | POA: Diagnosis not present

## 2021-11-16 DIAGNOSIS — C44311 Basal cell carcinoma of skin of nose: Secondary | ICD-10-CM | POA: Diagnosis not present

## 2021-11-16 DIAGNOSIS — L218 Other seborrheic dermatitis: Secondary | ICD-10-CM | POA: Diagnosis not present

## 2021-11-16 DIAGNOSIS — D492 Neoplasm of unspecified behavior of bone, soft tissue, and skin: Secondary | ICD-10-CM | POA: Diagnosis not present

## 2021-11-16 DIAGNOSIS — D225 Melanocytic nevi of trunk: Secondary | ICD-10-CM | POA: Diagnosis not present

## 2021-11-16 DIAGNOSIS — L570 Actinic keratosis: Secondary | ICD-10-CM | POA: Diagnosis not present

## 2021-11-16 DIAGNOSIS — L819 Disorder of pigmentation, unspecified: Secondary | ICD-10-CM | POA: Diagnosis not present

## 2021-11-18 DIAGNOSIS — D492 Neoplasm of unspecified behavior of bone, soft tissue, and skin: Secondary | ICD-10-CM | POA: Diagnosis not present

## 2021-11-23 ENCOUNTER — Encounter (INDEPENDENT_AMBULATORY_CARE_PROVIDER_SITE_OTHER): Payer: Self-pay | Admitting: Ophthalmology

## 2021-11-23 ENCOUNTER — Ambulatory Visit (INDEPENDENT_AMBULATORY_CARE_PROVIDER_SITE_OTHER): Payer: Medicare HMO | Admitting: Ophthalmology

## 2021-11-23 ENCOUNTER — Other Ambulatory Visit: Payer: Self-pay

## 2021-11-23 DIAGNOSIS — H353132 Nonexudative age-related macular degeneration, bilateral, intermediate dry stage: Secondary | ICD-10-CM | POA: Diagnosis not present

## 2021-11-23 DIAGNOSIS — H353211 Exudative age-related macular degeneration, right eye, with active choroidal neovascularization: Secondary | ICD-10-CM | POA: Diagnosis not present

## 2021-11-23 DIAGNOSIS — E119 Type 2 diabetes mellitus without complications: Secondary | ICD-10-CM | POA: Diagnosis not present

## 2021-11-23 DIAGNOSIS — H35721 Serous detachment of retinal pigment epithelium, right eye: Secondary | ICD-10-CM

## 2021-11-23 NOTE — Assessment & Plan Note (Signed)
No detectable diabetic retinopathy as of this date

## 2021-11-23 NOTE — Assessment & Plan Note (Signed)
Prior component of wet AMD, her condition resolved

## 2021-11-23 NOTE — Progress Notes (Signed)
11/23/2021     CHIEF COMPLAINT Patient presents for  Chief Complaint  Patient presents with   Retina Follow Up    History of wet AMD OD, now some 7 months post most recent therapy.  Follow-up today at 50-month interval to confirm no recurrence of CNVM.  HISTORY OF PRESENT ILLNESS: Kevin Suarez is a 81 y.o. male who presents to the clinic today for:   HPI     Retina Follow Up           Diagnosis: Other   Laterality: both eyes   Onset: 3 months ago   Severity: mild   Duration: 3 months   Course: stable         Comments   3 month fu OU and OCT/FP  Pt states VA OU stable since last visit. Pt denies FOL, floaters, or ocular pain OU.   Pt states, "I was wondering if my cataracts could have grown back because my vision is so blurry in both of my eyes and I cannot seem to be able to focus anymore."        Last edited by Kendra Opitz, COA on 11/23/2021 10:14 AM.      Referring physician: Monna Fam, MD [place],  [place] [address]  HISTORICAL INFORMATION:   Selected notes from the MEDICAL RECORD NUMBER    Lab Results  Component Value Date   HGBA1C 7.0 (H) 07/14/2020     CURRENT MEDICATIONS: Current Outpatient Medications (Ophthalmic Drugs)  Medication Sig   hydroxypropyl methylcellulose / hypromellose (ISOPTO TEARS / GONIOVISC) 2.5 % ophthalmic solution Place 1 drop into both eyes daily as needed for dry eyes.   No current facility-administered medications for this visit. (Ophthalmic Drugs)   Current Outpatient Medications (Other)  Medication Sig   finasteride (PROSCAR) 5 MG tablet Take 5 mg by mouth daily.   losartan (COZAAR) 25 MG tablet Take 50 mg by mouth daily.    metFORMIN (GLUCOPHAGE) 500 MG tablet Take 500 mg by mouth 2 (two) times daily with a meal.    omeprazole (PRILOSEC) 40 MG capsule Take 40 mg by mouth daily before breakfast.   pantoprazole (PROTONIX) 40 MG tablet Take 40 mg by mouth every evening.    tamsulosin  (FLOMAX) 0.4 MG CAPS capsule Take 0.4 mg by mouth at bedtime.   No current facility-administered medications for this visit. (Other)      REVIEW OF SYSTEMS:    ALLERGIES Allergies  Allergen Reactions   Prednisone Other (See Comments)    Doesn't like side effects of prednisone.     PAST MEDICAL HISTORY Past Medical History:  Diagnosis Date   Arthritis    back - steroid injection   Bladder stone    BPH (benign prostatic hypertrophy)    Frequency of urination    GERD (gastroesophageal reflux disease)    H/O asbestos exposure    Hearing loss 2020   no hearing aids   History of kidney stones    surgery to remove stones   Hyperlipidemia    Hypertension    Lung nodules    LAST CT 2012  PER DR WRIGHT NOTE NO FURTHER WORK-UP (PULMOLOGIST)   Type 2 diabetes mellitus ([place])    Wears dentures    full upper   Wears glasses    Wears partial dentures    lower   Past Surgical History:  Procedure Laterality Date   CATARACT EXTRACTION W/ INTRAOCULAR LENS  IMPLANT, BILATERAL  CYSTOSCOPY WITH LITHOLAPAXY N/A 11/28/2013   Procedure: CYSTOSCOPY WITH stone retrieval;  Surgeon: Claybon Jabs, MD;  Location: [facility];  Service: Urology;  Laterality: N/A;   CYSTOSCOPY WITH LITHOLAPAXY N/A 10/15/2015   Procedure: CYSTOSCOPY WITH LITHOLAPAXY, HOLMIUM LASER LITHOTRIPSY tranurethral excision of prostate;  Surgeon: [name], MD;  Location: [facility];  Service: Urology;  Laterality: N/A;   EXTRACORPOREAL SHOCK WAVE LITHOTRIPSY Right 05-13-2015   HOLMIUM LASER APPLICATION N/A 30/0/7622   Procedure: HOLMIUM LASER APPLICATION;  Surgeon: [name], MD;  Location: [facility];  Service: Urology;  Laterality: N/A;   TOTAL HIP ARTHROPLASTY Bilateral 2000  &  2005   TRANSURETHRAL RESECTION OF PROSTATE  11/28/2013   Procedure: TRANSURETHRAL RESECTION OF THE PROSTATE WITH  BUTTON GYRUS INSTRUMENTS;  Surgeon: Claybon Jabs, MD;  Location: [facility];  Service: Urology;;   WRIST SURGERY Left 2005   INJURY   XI ROBOTIC ASSISTED INGUINAL HERNIA REPAIR WITH MESH Left 07/20/2020   Procedure: ROBOTIC LEFT INGUINAL HERNIA WITH MESH USING PROGRIP LAPAROSCOPIC SELF-FIXATING MESH 16cm x 12cm;  Surgeon: Ralene Ok, MD;  Location: [facility];  Service: General;  Laterality: Left;    FAMILY HISTORY Family History  Problem Relation Age of Onset   Cancer Mother        Unknown   Cancer Maternal Grandmother    Heart attack Paternal Grandfather    Coronary artery disease Father    Diabetes Father     SOCIAL HISTORY Social History   Tobacco Use   Smoking status: Former    Packs/day: 2.00    Years: 30.00    Pack years: 60.00    Types: Cigarettes    Quit date: 11/14/1995    Years since quitting: 26.0   Smokeless tobacco: Never  Vaping Use   Vaping Use: Never used  Substance Use Topics   Alcohol use: No   Drug use: No         OPHTHALMIC EXAM:  Base Eye Exam     Visual Acuity (ETDRS)       Right Left   Dist cc 20/20 -2 20/25    Correction: Glasses         Tonometry (Tonopen, 10:18 AM)       Right Left   Pressure 11 10         Pupils       Pupils Dark Light Shape React APD   Right PERRL 2 2 Round Minimal None   Left PERRL 2 2 Round Minimal None         Visual Fields (Counting fingers)       Left Right    Full Full         Extraocular Movement       Right Left    Full, Ortho Full, Ortho         Neuro/Psych     Oriented x3: Yes   Mood/Affect: Normal         Dilation     Both eyes: 1.0% Mydriacyl, 2.5% Phenylephrine @ 10:18 AM           Slit Lamp and Fundus Exam     External Exam       Right Left   External Normal Normal         Slit Lamp Exam       Right Left   Lids/Lashes Normal Normal   Conjunctiva/Sclera White and quiet White and quiet  Cornea Clear Clear   Anterior Chamber Deep and quiet Deep and quiet   Iris Round and reactive Round and reactive    Lens Posterior chamber intraocular lens Posterior chamber intraocular lens   Anterior Vitreous Normal Normal         Fundus Exam       Right Left   Posterior Vitreous , Posterior vitreous detachment, peripheral vitreous floaters superiorly as well. Posterior vitreous detachment   Disc Normal Normal   C/D Ratio 0.55 0.6   Macula Retinal pigment epithelial atrophy, intermediate age related macular degeneration, Mottling, Retinal pigment epithelial mottling, Hard drusen, Soft drusen, no macular thickening, no hemorrhage, Foveal hypoplasia Retinal pigment epithelial mottling, no macular thickening, Hard drusen, no hemorrhage, Intermediate age related macular degeneration   Vessels ,,, NO DR Normal   Periphery Normal Normal            IMAGING AND PROCEDURES  Imaging and Procedures for 11/23/21  OCT, Retina - OU - Both Eyes       Right Eye Quality was good. Scan locations included subfoveal. Central Foveal Thickness: 252. Progression has improved. Findings include no IRF, abnormal foveal contour, no SRF.   Left Eye Quality was good. Scan locations included subfoveal. Central Foveal Thickness: 282. Progression has been stable. Findings include abnormal foveal contour, retinal drusen , no IRF, no SRF.   Notes No active CNVM, serous retinal detachment subfoveal right eye last visualized February 2021 with small lingering pigment epithelial detachment superotemporal to the FAZ controlled.  Now some 7 months post most recent therapy       Color Fundus Photography Optos - OU - Both Eyes       Right Eye Progression has been stable. Disc findings include normal observations, increased cup to disc ratio. Macula : drusen. Vessels : normal observations.   Left Eye Progression has been stable. Disc findings include normal observations, increased cup to disc ratio. Macula : drusen. Vessels : normal observations.   Notes Incidental benign reticulated RPE degeneration retinal  periphery OU clear media OU note no new progressions of AMD             ASSESSMENT/PLAN:  Exudative age-related macular degeneration of right eye with active choroidal neovascularization (HCC) No sign of active CNVM now 7 months post most recent therapy OD  Serous detachment of retinal pigment epithelium of right eye Prior component of wet AMD, her condition resolved  Diabetes mellitus without complication (HCC) No detectable diabetic retinopathy as of this date  Intermediate stage nonexudative age-related macular degeneration of both eyes No sign of CNVM OS     ICD-10-CM   1. Exudative age-related macular degeneration of right eye with active choroidal neovascularization (HCC)  H35.3211 OCT, Retina - OU - Both Eyes    Color Fundus Photography Optos - OU - Both Eyes    2. Intermediate stage nonexudative age-related macular degeneration of both eyes  H35.3132     3. Serous detachment of retinal pigment epithelium of right eye  H35.721     4. Diabetes mellitus without complication ([place])  X90.2       1.  OD no sign of recurrent CNVM will continue to observe now 7 months post most recent therapy thus 4.5 months off of therapy  2.  OS no sign of CNVM follow-up in 6 months OU  3.  Ophthalmic Meds Ordered this visit:  No orders of the defined types were placed in this encounter.      Return for DILATE  OU, COLOR FP, OCT.  There are no Patient Instructions on file for this visit.   Explained the diagnoses, plan, and follow up with the patient and they expressed understanding.  Patient expressed understanding of the importance of proper follow up care.   Clent Demark Connor Foxworthy M.D. Diseases & Surgery of the Retina and Vitreous Retina & Diabetic [place] 11/23/21     Abbreviations: M myopia (nearsighted); A astigmatism; H hyperopia (farsighted); P presbyopia; Mrx spectacle prescription;  CTL contact lenses; OD right eye; OS left eye; OU both eyes  XT exotropia; ET  esotropia; PEK punctate epithelial keratitis; PEE punctate epithelial erosions; DES dry eye syndrome; MGD meibomian gland dysfunction; ATs artificial tears; PFAT's preservative free artificial tears; [place] nuclear sclerotic cataract; PSC posterior subcapsular cataract; ERM epi-retinal membrane; PVD posterior vitreous detachment; RD retinal detachment; DM diabetes mellitus; DR diabetic retinopathy; NPDR non-proliferative diabetic retinopathy; PDR proliferative diabetic retinopathy; CSME clinically significant macular edema; DME diabetic macular edema; dbh dot blot hemorrhages; CWS cotton wool spot; POAG primary open angle glaucoma; C/D cup-to-disc ratio; HVF humphrey visual field; GVF goldmann visual field; OCT optical coherence tomography; IOP intraocular pressure; BRVO Branch retinal vein occlusion; CRVO central retinal vein occlusion; CRAO central retinal artery occlusion; BRAO branch retinal artery occlusion; RT retinal tear; SB scleral buckle; PPV pars plana vitrectomy; VH Vitreous hemorrhage; PRP panretinal laser photocoagulation; IVK intravitreal kenalog; VMT vitreomacular traction; MH Macular hole;  NVD neovascularization of the disc; NVE neovascularization elsewhere; AREDS age related eye disease study; ARMD age related macular degeneration; POAG primary open angle glaucoma; EBMD epithelial/anterior basement membrane dystrophy; ACIOL anterior chamber intraocular lens; IOL intraocular lens; PCIOL posterior chamber intraocular lens; Phaco/IOL phacoemulsification with intraocular lens placement; [place] photorefractive keratectomy; LASIK laser assisted in situ keratomileusis; HTN hypertension; DM diabetes mellitus; COPD chronic obstructive pulmonary disease

## 2021-11-23 NOTE — Assessment & Plan Note (Signed)
No sign of active CNVM now 7 months post most recent therapy OD

## 2021-11-23 NOTE — Assessment & Plan Note (Signed)
No sign of CNVM OS 

## 2021-11-24 ENCOUNTER — Encounter (INDEPENDENT_AMBULATORY_CARE_PROVIDER_SITE_OTHER): Payer: Medicare HMO | Admitting: Ophthalmology

## 2021-11-24 DIAGNOSIS — C4491 Basal cell carcinoma of skin, unspecified: Secondary | ICD-10-CM | POA: Diagnosis not present

## 2021-11-24 DIAGNOSIS — C44311 Basal cell carcinoma of skin of nose: Secondary | ICD-10-CM | POA: Diagnosis not present

## 2021-11-24 DIAGNOSIS — D485 Neoplasm of uncertain behavior of skin: Secondary | ICD-10-CM | POA: Diagnosis not present

## 2021-11-24 DIAGNOSIS — Z481 Encounter for planned postprocedural wound closure: Secondary | ICD-10-CM | POA: Diagnosis not present

## 2021-12-01 DIAGNOSIS — M461 Sacroiliitis, not elsewhere classified: Secondary | ICD-10-CM | POA: Diagnosis not present

## 2021-12-08 DIAGNOSIS — Z48817 Encounter for surgical aftercare following surgery on the skin and subcutaneous tissue: Secondary | ICD-10-CM | POA: Diagnosis not present

## 2021-12-08 DIAGNOSIS — C44311 Basal cell carcinoma of skin of nose: Secondary | ICD-10-CM | POA: Diagnosis not present

## 2021-12-14 DIAGNOSIS — E538 Deficiency of other specified B group vitamins: Secondary | ICD-10-CM | POA: Diagnosis not present

## 2021-12-28 DIAGNOSIS — M461 Sacroiliitis, not elsewhere classified: Secondary | ICD-10-CM | POA: Diagnosis not present

## 2021-12-28 DIAGNOSIS — Z9689 Presence of other specified functional implants: Secondary | ICD-10-CM | POA: Diagnosis not present

## 2021-12-28 DIAGNOSIS — G894 Chronic pain syndrome: Secondary | ICD-10-CM | POA: Diagnosis not present

## 2022-01-20 DIAGNOSIS — Z7689 Persons encountering health services in other specified circumstances: Secondary | ICD-10-CM | POA: Diagnosis not present

## 2022-01-20 DIAGNOSIS — E7800 Pure hypercholesterolemia, unspecified: Secondary | ICD-10-CM | POA: Diagnosis not present

## 2022-01-20 DIAGNOSIS — N182 Chronic kidney disease, stage 2 (mild): Secondary | ICD-10-CM | POA: Diagnosis not present

## 2022-01-20 DIAGNOSIS — I129 Hypertensive chronic kidney disease with stage 1 through stage 4 chronic kidney disease, or unspecified chronic kidney disease: Secondary | ICD-10-CM | POA: Diagnosis not present

## 2022-01-20 DIAGNOSIS — E1129 Type 2 diabetes mellitus with other diabetic kidney complication: Secondary | ICD-10-CM | POA: Diagnosis not present

## 2022-01-20 DIAGNOSIS — Z125 Encounter for screening for malignant neoplasm of prostate: Secondary | ICD-10-CM | POA: Diagnosis not present

## 2022-01-27 DIAGNOSIS — E7800 Pure hypercholesterolemia, unspecified: Secondary | ICD-10-CM | POA: Diagnosis not present

## 2022-01-27 DIAGNOSIS — R972 Elevated prostate specific antigen [PSA]: Secondary | ICD-10-CM | POA: Diagnosis not present

## 2022-01-27 DIAGNOSIS — I700 Atherosclerosis of aorta: Secondary | ICD-10-CM | POA: Diagnosis not present

## 2022-01-27 DIAGNOSIS — I129 Hypertensive chronic kidney disease with stage 1 through stage 4 chronic kidney disease, or unspecified chronic kidney disease: Secondary | ICD-10-CM | POA: Diagnosis not present

## 2022-01-27 DIAGNOSIS — Z0000 Encounter for general adult medical examination without abnormal findings: Secondary | ICD-10-CM | POA: Diagnosis not present

## 2022-01-27 DIAGNOSIS — I6523 Occlusion and stenosis of bilateral carotid arteries: Secondary | ICD-10-CM | POA: Diagnosis not present

## 2022-01-27 DIAGNOSIS — E1140 Type 2 diabetes mellitus with diabetic neuropathy, unspecified: Secondary | ICD-10-CM | POA: Diagnosis not present

## 2022-01-27 DIAGNOSIS — N182 Chronic kidney disease, stage 2 (mild): Secondary | ICD-10-CM | POA: Diagnosis not present

## 2022-01-27 DIAGNOSIS — Z1339 Encounter for screening examination for other mental health and behavioral disorders: Secondary | ICD-10-CM | POA: Diagnosis not present

## 2022-01-27 DIAGNOSIS — E1129 Type 2 diabetes mellitus with other diabetic kidney complication: Secondary | ICD-10-CM | POA: Diagnosis not present

## 2022-01-27 DIAGNOSIS — Z1331 Encounter for screening for depression: Secondary | ICD-10-CM | POA: Diagnosis not present

## 2022-01-27 DIAGNOSIS — E669 Obesity, unspecified: Secondary | ICD-10-CM | POA: Diagnosis not present

## 2022-01-27 DIAGNOSIS — R82998 Other abnormal findings in urine: Secondary | ICD-10-CM | POA: Diagnosis not present

## 2022-01-31 DIAGNOSIS — C44311 Basal cell carcinoma of skin of nose: Secondary | ICD-10-CM | POA: Diagnosis not present

## 2022-02-16 DIAGNOSIS — E538 Deficiency of other specified B group vitamins: Secondary | ICD-10-CM | POA: Diagnosis not present

## 2022-02-28 DIAGNOSIS — I951 Orthostatic hypotension: Secondary | ICD-10-CM | POA: Diagnosis not present

## 2022-03-10 DIAGNOSIS — G894 Chronic pain syndrome: Secondary | ICD-10-CM | POA: Diagnosis not present

## 2022-03-10 DIAGNOSIS — T85111A Breakdown (mechanical) of implanted electronic neurostimulator (electrode) of peripheral nerve, initial encounter: Secondary | ICD-10-CM | POA: Diagnosis not present

## 2022-03-10 DIAGNOSIS — T85193A Other mechanical complication of implanted electronic neurostimulator, generator, initial encounter: Secondary | ICD-10-CM | POA: Diagnosis not present

## 2022-03-10 DIAGNOSIS — Z9689 Presence of other specified functional implants: Secondary | ICD-10-CM | POA: Diagnosis not present

## 2022-03-14 DIAGNOSIS — H903 Sensorineural hearing loss, bilateral: Secondary | ICD-10-CM | POA: Diagnosis not present

## 2022-03-14 DIAGNOSIS — H9313 Tinnitus, bilateral: Secondary | ICD-10-CM | POA: Diagnosis not present

## 2022-04-19 DIAGNOSIS — E538 Deficiency of other specified B group vitamins: Secondary | ICD-10-CM | POA: Diagnosis not present

## 2022-05-02 DIAGNOSIS — R972 Elevated prostate specific antigen [PSA]: Secondary | ICD-10-CM | POA: Diagnosis not present

## 2022-05-09 DIAGNOSIS — N401 Enlarged prostate with lower urinary tract symptoms: Secondary | ICD-10-CM | POA: Diagnosis not present

## 2022-05-09 DIAGNOSIS — N5201 Erectile dysfunction due to arterial insufficiency: Secondary | ICD-10-CM | POA: Diagnosis not present

## 2022-05-09 DIAGNOSIS — R350 Frequency of micturition: Secondary | ICD-10-CM | POA: Diagnosis not present

## 2022-05-09 DIAGNOSIS — R972 Elevated prostate specific antigen [PSA]: Secondary | ICD-10-CM | POA: Diagnosis not present

## 2022-05-18 DIAGNOSIS — D492 Neoplasm of unspecified behavior of bone, soft tissue, and skin: Secondary | ICD-10-CM | POA: Diagnosis not present

## 2022-05-18 DIAGNOSIS — L738 Other specified follicular disorders: Secondary | ICD-10-CM | POA: Diagnosis not present

## 2022-05-18 DIAGNOSIS — R208 Other disturbances of skin sensation: Secondary | ICD-10-CM | POA: Diagnosis not present

## 2022-05-18 DIAGNOSIS — D485 Neoplasm of uncertain behavior of skin: Secondary | ICD-10-CM | POA: Diagnosis not present

## 2022-05-18 DIAGNOSIS — L718 Other rosacea: Secondary | ICD-10-CM | POA: Diagnosis not present

## 2022-05-23 ENCOUNTER — Ambulatory Visit (INDEPENDENT_AMBULATORY_CARE_PROVIDER_SITE_OTHER): Payer: Medicare HMO | Admitting: Ophthalmology

## 2022-05-23 ENCOUNTER — Encounter (INDEPENDENT_AMBULATORY_CARE_PROVIDER_SITE_OTHER): Payer: Self-pay | Admitting: Ophthalmology

## 2022-05-23 DIAGNOSIS — E119 Type 2 diabetes mellitus without complications: Secondary | ICD-10-CM

## 2022-05-23 DIAGNOSIS — H353211 Exudative age-related macular degeneration, right eye, with active choroidal neovascularization: Secondary | ICD-10-CM | POA: Diagnosis not present

## 2022-05-23 DIAGNOSIS — H353132 Nonexudative age-related macular degeneration, bilateral, intermediate dry stage: Secondary | ICD-10-CM

## 2022-05-23 DIAGNOSIS — H43391 Other vitreous opacities, right eye: Secondary | ICD-10-CM | POA: Insufficient documentation

## 2022-05-23 NOTE — Assessment & Plan Note (Signed)
Patient had complaints but certainly they are only ongoing at this time.  Discussed potential surgical intervention only if his symptoms hamper activities of daily living on a consistent basis

## 2022-05-23 NOTE — Progress Notes (Signed)
05/23/2022     CHIEF COMPLAINT Patient presents for  Chief Complaint  Patient presents with   Macular Degeneration      HISTORY OF PRESENT ILLNESS: Kevin Suarez is a 81 y.o. male who presents to the clinic today for:   HPI   6 MOS FU OU OCT FP. Pt stated having a hard time focusing. "This right eye, where the floaters are its getting worse. Theres none in my left eye but theres a lot in my right eye. I feel like I'm having a hard time focusing."  Last edited by Silvestre Moment on 05/23/2022 10:48 AM.      Referring physician: Haywood Pao, MD [place],  [place] [address]  HISTORICAL INFORMATION:   Selected notes from the [facility]    Lab Results  Component Value Date   HGBA1C 7.0 (H) 07/14/2020     CURRENT MEDICATIONS: Current Outpatient Medications (Ophthalmic Drugs)  Medication Sig   hydroxypropyl methylcellulose / hypromellose (ISOPTO TEARS / GONIOVISC) 2.5 % ophthalmic solution Place 1 drop into both eyes daily as needed for dry eyes.   No current facility-administered medications for this visit. (Ophthalmic Drugs)   Current Outpatient Medications (Other)  Medication Sig   finasteride (PROSCAR) 5 MG tablet Take 5 mg by mouth daily.   losartan (COZAAR) 25 MG tablet Take 50 mg by mouth daily.    metFORMIN (GLUCOPHAGE) 500 MG tablet Take 500 mg by mouth 2 (two) times daily with a meal.    omeprazole (PRILOSEC) 40 MG capsule Take 40 mg by mouth daily before breakfast.   pantoprazole (PROTONIX) 40 MG tablet Take 40 mg by mouth every evening.    tamsulosin (FLOMAX) 0.4 MG CAPS capsule Take 0.4 mg by mouth at bedtime.   No current facility-administered medications for this visit. (Other)      REVIEW OF SYSTEMS: ROS   Negative for: Constitutional, Gastrointestinal, Neurological, Skin, Genitourinary, Musculoskeletal, HENT, Endocrine, Cardiovascular, Eyes, Respiratory, Psychiatric, Allergic/Imm, Heme/Lymph Last edited by Silvestre Moment on  05/23/2022 10:48 AM.       ALLERGIES Allergies  Allergen Reactions   Prednisone Other (See Comments)    Doesn't like side effects of prednisone.     PAST MEDICAL HISTORY Past Medical History:  Diagnosis Date   Arthritis    back - steroid injection   Bladder stone    BPH (benign prostatic hypertrophy)    Frequency of urination    GERD (gastroesophageal reflux disease)    H/O asbestos exposure    Hearing loss 2020   no hearing aids   History of kidney stones    surgery to remove stones   Hyperlipidemia    Hypertension    Lung nodules    LAST CT 2012  PER DR WRIGHT NOTE NO FURTHER WORK-UP (PULMOLOGIST)   Type 2 diabetes mellitus ([place])    Wears dentures    full upper   Wears glasses    Wears partial dentures    lower   Past Surgical History:  Procedure Laterality Date   CATARACT EXTRACTION W/ INTRAOCULAR LENS  IMPLANT, BILATERAL     CYSTOSCOPY WITH LITHOLAPAXY N/A 11/28/2013   Procedure: CYSTOSCOPY WITH stone retrieval;  Surgeon: Claybon Jabs, MD;  Location: [facility];  Service: Urology;  Laterality: N/A;   CYSTOSCOPY WITH LITHOLAPAXY N/A 10/15/2015   Procedure: CYSTOSCOPY WITH LITHOLAPAXY, HOLMIUM LASER LITHOTRIPSY tranurethral excision of prostate;  Surgeon: [name], MD;  Location: [facility];  Service: Urology;  Laterality: N/A;   EXTRACORPOREAL SHOCK WAVE LITHOTRIPSY Right 05-13-2015   HOLMIUM LASER APPLICATION N/A 86/05/6194   Procedure: HOLMIUM LASER APPLICATION;  Surgeon: [name], MD;  Location: [facility];  Service: Urology;  Laterality: N/A;   TOTAL HIP ARTHROPLASTY Bilateral 2000  &  2005   TRANSURETHRAL RESECTION OF PROSTATE  11/28/2013   Procedure: TRANSURETHRAL RESECTION OF THE PROSTATE WITH  BUTTON GYRUS INSTRUMENTS;  Surgeon: Claybon Jabs, MD;  Location: [place];  Service: Urology;;   WRIST SURGERY Left 2005   INJURY   XI ROBOTIC ASSISTED INGUINAL HERNIA REPAIR WITH MESH Left  07/20/2020   Procedure: ROBOTIC LEFT INGUINAL HERNIA WITH MESH USING PROGRIP LAPAROSCOPIC SELF-FIXATING MESH 16cm x 12cm;  Surgeon: Ralene Ok, MD;  Location: [facility] OR;  Service: General;  Laterality: Left;    FAMILY HISTORY Family History  Problem Relation Age of Onset   Cancer Mother        Unknown   Cancer Maternal Grandmother    Heart attack Paternal Grandfather    Coronary artery disease Father    Diabetes Father     SOCIAL HISTORY Social History   Tobacco Use   Smoking status: Former    Packs/day: 2.00    Years: 30.00    Total pack years: 60.00    Types: Cigarettes    Quit date: 11/14/1995    Years since quitting: 26.5   Smokeless tobacco: Never  Vaping Use   Vaping Use: Never used  Substance Use Topics   Alcohol use: No   Drug use: No         OPHTHALMIC EXAM:  Base Eye Exam     Visual Acuity (ETDRS)       Right Left   Dist cc 20/20 -1 20/20    Correction: Glasses         Tonometry (Tonopen, 10:52 AM)       Right Left   Pressure 14 16         Pupils       Pupils APD   Right PERRL None   Left PERRL None         Visual Fields       Left Right    Full Full         Extraocular Movement       Right Left    Full Full         Neuro/Psych     Oriented x3: Yes   Mood/Affect: Normal         Dilation     Both eyes: 1.0% Mydriacyl, 2.5% Phenylephrine @ 10:52 AM           Slit Lamp and Fundus Exam     External Exam       Right Left   External Normal Normal         Slit Lamp Exam       Right Left   Lids/Lashes Normal Normal   Conjunctiva/Sclera White and quiet White and quiet   Cornea Clear Clear   Anterior Chamber Deep and quiet Deep and quiet   Iris Round and reactive Round and reactive   Lens Posterior chamber intraocular lens Posterior chamber intraocular lens   Anterior Vitreous Normal Normal         Fundus Exam       Right Left   Posterior Vitreous Posterior vitreous detachment, peripheral  vitreous floaters superiorly as well. Posterior vitreous detachment   Disc Normal Normal  C/D Ratio 0.55 0.6   Macula Retinal pigment epithelial atrophy, intermediate age related macular degeneration, Mottling, Retinal pigment epithelial mottling, Hard drusen, Soft drusen, no macular thickening, no hemorrhage, Foveal hypoplasia Retinal pigment epithelial mottling, no macular thickening, Hard drusen, no hemorrhage, Intermediate age related macular degeneration   Vessels ,,, NO DR Normal   Periphery Normal Normal            IMAGING AND PROCEDURES  Imaging and Procedures for 05/23/22  OCT, Retina - OU - Both Eyes       Right Eye Quality was good. Scan locations included subfoveal. Central Foveal Thickness: 255. Progression has been stable. Findings include no IRF, no SRF, abnormal foveal contour.   Left Eye Quality was good. Scan locations included subfoveal. Central Foveal Thickness: 288. Progression has worsened. Findings include no IRF, no SRF, abnormal foveal contour, retinal drusen .   Notes No active CNVM, serous retinal detachment subfoveal right eye last visualized February 2021 with small lingering pigment epithelial detachment superotemporal to the FAZ controlled.  Now some 33month post most recent therapy        Color Fundus Photography Optos - OU - Both Eyes       Right Eye Progression has been stable. Disc findings include normal observations, increased cup to disc ratio. Macula : drusen. Vessels : normal observations.   Left Eye Progression has been stable. Disc findings include normal observations, increased cup to disc ratio. Macula : drusen. Vessels : normal observations.   Notes Incidental benign reticulated RPE degeneration retinal periphery OU clear media OU note no new progressions of AMD  Vitreous floaters OD.                ASSESSMENT/PLAN:  Diabetes mellitus without complication (HCC) No detectable DR  Exudative age-related macular  degeneration of right eye with active choroidal neovascularization (HCC) OD, no treatment for wet AMD for last 13 months doing well no sign of recurrence  Intermediate stage nonexudative age-related macular degeneration of both eyes No sign of CNVM OS  Vitreous floaters of right eye Patient had complaints but certainly they are only ongoing at this time.  Discussed potential surgical intervention only if his symptoms hamper activities of daily living on a consistent basis     ICD-10-CM   1. Exudative age-related macular degeneration of right eye with active choroidal neovascularization (HCC)  H35.3211 OCT, Retina - OU - Both Eyes    Color Fundus Photography Optos - OU - Both Eyes    2. [place]  EP80.9    3. Intermediate stage nonexudative age-related macular degeneration of both eyes  H35.3132     4. Vitreous floaters of right eye  H43.391       1.  OU stable, no signs of progression of ARMD.  No signs of recurrence of CNVM or SRF. no signs of recurrence of CNVM.  2.  OD with vitreous floaters continue to observe  3.  No detectable diabetic retinopathy  Ophthalmic Meds Ordered this visit:  No orders of the defined types were placed in this encounter.      Return in about 6 months (around 11/23/2022) for DILATE OU, COLOR FP, OCT.  There are no Patient Instructions on file for this visit.   Explained the diagnoses, plan, and follow up with the patient and they expressed understanding.  Patient expressed understanding of the importance of proper follow up care.   Kevin Suarez M.D. Diseases & Surgery of the Retina  and Vitreous Retina [facility]abetic [place] 05/23/22     Abbreviations: M myopia (nearsighted); A astigmatism; H hyperopia (farsighted); P presbyopia; Mrx spectacle prescription;  CTL contact lenses; OD right eye; OS left eye; OU both eyes  XT exotropia; ET esotropia; PEK punctate epithelial keratitis; PEE punctate epithelial  erosions; DES dry eye syndrome; MGD meibomian gland dysfunction; ATs artificial tears; PFAT's preservative free artificial tears; Wallace nuclear sclerotic cataract; PSC posterior subcapsular cataract; ERM epi-retinal membrane; PVD posterior vitreous detachment; RD retinal detachment; DM diabetes mellitus; DR diabetic retinopathy; NPDR non-proliferative diabetic retinopathy; PDR proliferative diabetic retinopathy; CSME clinically significant macular edema; DME diabetic macular edema; dbh dot blot hemorrhages; CWS cotton wool spot; POAG primary open angle glaucoma; C/D cup-to-disc ratio; HVF humphrey visual field; GVF goldmann visual field; OCT optical coherence tomography; IOP intraocular pressure; BRVO Branch retinal vein occlusion; CRVO central retinal vein occlusion; CRAO central retinal artery occlusion; BRAO branch retinal artery occlusion; RT retinal tear; SB scleral buckle; PPV pars plana vitrectomy; VH Vitreous hemorrhage; PRP panretinal laser photocoagulation; IVK intravitreal kenalog; VMT vitreomacular traction; MH Macular hole;  NVD neovascularization of the disc; NVE neovascularization elsewhere; AREDS age related eye disease study; ARMD age related macular degeneration; POAG primary open angle glaucoma; EBMD epithelial/anterior basement membrane dystrophy; ACIOL anterior chamber intraocular lens; IOL intraocular lens; PCIOL posterior chamber intraocular lens; Phaco/IOL phacoemulsification with intraocular lens placement; [place] photorefractive keratectomy; LASIK laser assisted in situ keratomileusis; HTN hypertension; DM diabetes mellitus; COPD chronic obstructive pulmonary disease

## 2022-05-23 NOTE — Assessment & Plan Note (Signed)
No sign of CNVM OS 

## 2022-05-23 NOTE — Assessment & Plan Note (Signed)
OD, no treatment for wet AMD for last 13 months doing well no sign of recurrence

## 2022-05-23 NOTE — Assessment & Plan Note (Signed)
No detectable DR

## 2022-06-13 ENCOUNTER — Other Ambulatory Visit: Payer: Self-pay | Admitting: *Deleted

## 2022-06-13 NOTE — Patient Outreach (Signed)
  Care Coordination   Initial Visit Note   06/13/2022 Name: Kevin Suarez MRN: [id number] DOB: November 22, 1940  Kevin Suarez is a 81 y.o. year old male who sees Tisovec, Fransico Him, MD for primary care. I  left voice mail message.   SDOH assessments and interventions completed:   No   Care Coordination Interventions Activated:  No Encounter Outcome:  No Answer left voice mail  [facility] [phone]

## 2022-07-06 DIAGNOSIS — E538 Deficiency of other specified B group vitamins: Secondary | ICD-10-CM | POA: Diagnosis not present

## 2022-08-01 DIAGNOSIS — N182 Chronic kidney disease, stage 2 (mild): Secondary | ICD-10-CM | POA: Diagnosis not present

## 2022-08-01 DIAGNOSIS — M545 Low back pain, unspecified: Secondary | ICD-10-CM | POA: Diagnosis not present

## 2022-08-01 DIAGNOSIS — H9193 Unspecified hearing loss, bilateral: Secondary | ICD-10-CM | POA: Diagnosis not present

## 2022-08-01 DIAGNOSIS — E1140 Type 2 diabetes mellitus with diabetic neuropathy, unspecified: Secondary | ICD-10-CM | POA: Diagnosis not present

## 2022-08-01 DIAGNOSIS — E669 Obesity, unspecified: Secondary | ICD-10-CM | POA: Diagnosis not present

## 2022-08-01 DIAGNOSIS — I129 Hypertensive chronic kidney disease with stage 1 through stage 4 chronic kidney disease, or unspecified chronic kidney disease: Secondary | ICD-10-CM | POA: Diagnosis not present

## 2022-08-01 DIAGNOSIS — R972 Elevated prostate specific antigen [PSA]: Secondary | ICD-10-CM | POA: Diagnosis not present

## 2022-08-01 DIAGNOSIS — I700 Atherosclerosis of aorta: Secondary | ICD-10-CM | POA: Diagnosis not present

## 2022-08-01 DIAGNOSIS — E1129 Type 2 diabetes mellitus with other diabetic kidney complication: Secondary | ICD-10-CM | POA: Diagnosis not present

## 2022-08-01 DIAGNOSIS — E538 Deficiency of other specified B group vitamins: Secondary | ICD-10-CM | POA: Diagnosis not present

## 2022-08-01 DIAGNOSIS — E7800 Pure hypercholesterolemia, unspecified: Secondary | ICD-10-CM | POA: Diagnosis not present

## 2022-08-01 DIAGNOSIS — I6523 Occlusion and stenosis of bilateral carotid arteries: Secondary | ICD-10-CM | POA: Diagnosis not present

## 2022-08-26 DIAGNOSIS — Z23 Encounter for immunization: Secondary | ICD-10-CM | POA: Diagnosis not present

## 2022-08-28 DIAGNOSIS — M5416 Radiculopathy, lumbar region: Secondary | ICD-10-CM | POA: Diagnosis not present

## 2022-08-28 DIAGNOSIS — M48062 Spinal stenosis, lumbar region with neurogenic claudication: Secondary | ICD-10-CM | POA: Diagnosis not present

## 2022-08-28 DIAGNOSIS — Z6829 Body mass index (BMI) 29.0-29.9, adult: Secondary | ICD-10-CM | POA: Diagnosis not present

## 2022-08-31 DIAGNOSIS — E538 Deficiency of other specified B group vitamins: Secondary | ICD-10-CM | POA: Diagnosis not present

## 2022-09-01 ENCOUNTER — Other Ambulatory Visit: Payer: Self-pay | Admitting: Orthopaedic Surgery

## 2022-09-01 DIAGNOSIS — M48062 Spinal stenosis, lumbar region with neurogenic claudication: Secondary | ICD-10-CM

## 2022-09-01 DIAGNOSIS — M5416 Radiculopathy, lumbar region: Secondary | ICD-10-CM

## 2022-09-09 ENCOUNTER — Ambulatory Visit
Admission: RE | Admit: 2022-09-09 | Discharge: 2022-09-09 | Disposition: A | Discharge status: Home / Self Care | Payer: Medicare HMO | Source: Ambulatory Visit | Attending: Orthopaedic Surgery | Admitting: Orthopaedic Surgery

## 2022-09-09 DIAGNOSIS — M5416 Radiculopathy, lumbar region: Secondary | ICD-10-CM

## 2022-09-09 DIAGNOSIS — M48061 Spinal stenosis, lumbar region without neurogenic claudication: Secondary | ICD-10-CM | POA: Diagnosis not present

## 2022-09-09 DIAGNOSIS — M48062 Spinal stenosis, lumbar region with neurogenic claudication: Secondary | ICD-10-CM

## 2022-09-17 IMAGING — CT CT ANGIO HEAD-NECK (W OR W/O PERF)
2 of 8 series · 3 of 27 positions shown · IV contrast (iopamidol)
Comparison: None.

CLINICAL DATA: Dizziness, syncope, blurred vision on 09/24/2021

EXAM:
CT ANGIOGRAPHY HEAD AND NECK
TECHNIQUE: Multidetector CT imaging of the head and neck was performed using
the standard protocol during bolus administration of intravenous
contrast. Multiplanar CT image reconstructions and MIPs were
obtained to evaluate the vascular anatomy. Carotid stenosis
measurements (when applicable) are obtained utilizing NASCET
criteria, using the distal internal carotid diameter as the
denominator.
CONTRAST:  60mL [id number] IOPAMIDOL ([id number]) INJECTION 76%

[Series 14: brain 3.00 hr40 s3 sag without ibhc · sagittal · non-contrast · 0.38mm/px · 1 of 64 slices shown]
[im 32/64  soft-tissue]
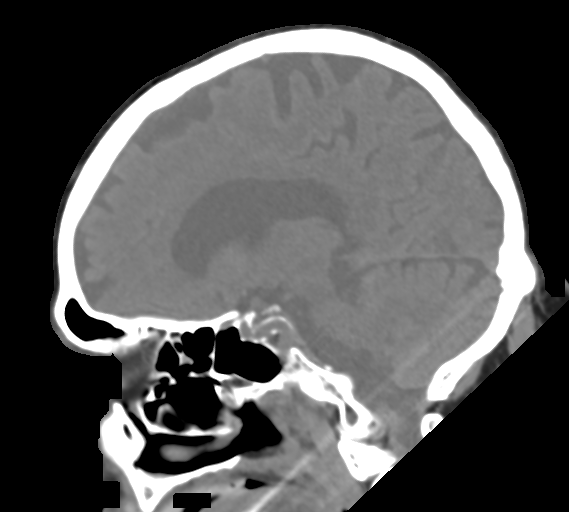

[Series 18: cta head & neck 1.00 hv48 s3 ax thin mips · axial · 0.50mm/px · z∈[-726,-603]mm · 2 of 370 slices shown]
[im 124/370  soft-tissue]
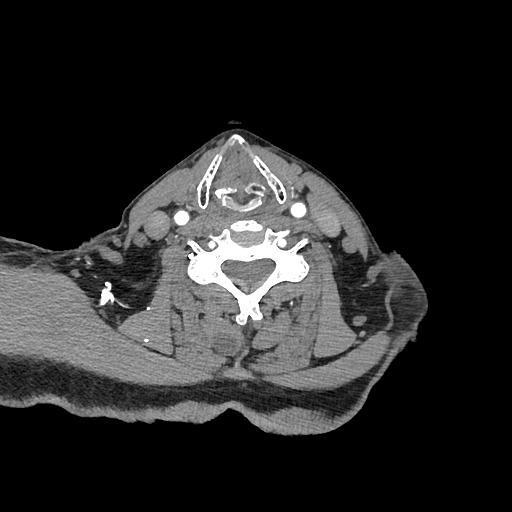
[im 247/370  bone]
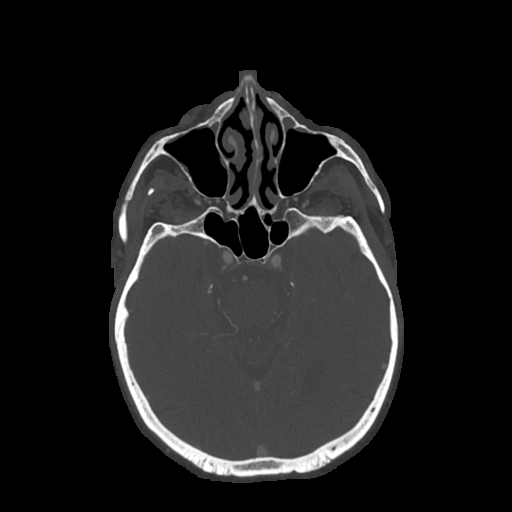

[3 of 27 positions shown; findings below may reference images not displayed]

FINDINGS: CT HEAD FINDINGS

Brain: There is age related atrophy. There are mild chronic
small-vessel ischemic changes of the white matter. No sign of acute
infarction, mass lesion, hemorrhage, hydrocephalus or extra-axial
collection.

Vascular: There is atherosclerotic calcification of the major
vessels at the base of the brain.

Skull: Negative

Sinuses: Clear except for minimal mucosal thickening of the
maxillary sinus floors.

Orbits: Negative

Review of the MIP images confirms the above findings

CTA NECK FINDINGS

Aortic arch: Aortic atherosclerosis. Branching pattern is normal
without origin stenosis.

Right carotid system: Common carotid artery is widely patent to the
bifurcation. Calcified plaque at the carotid bifurcation and ICA
bulb but no stenosis. Cervical ICA widely patent.

Left carotid system: Common carotid artery widely patent to the
bifurcation. Mild calcified plaque at the carotid bifurcation and
ICA bulb but no stenosis. Cervical ICA widely patent beyond that.

Vertebral arteries: Both vertebral artery origins widely patent.
Both vertebral arteries appear normal through the cervical region to
the foramen magnum.

Skeleton: Ordinary cervical spondylosis.

Other neck: Cyst associated with the posterior paraspinous muscles
from C3-C5 measuring 3 x 1.5 x 1.5 cm. Significance is doubtful.

Upper chest: Normal

Review of the MIP images confirms the above findings

CTA HEAD FINDINGS

Anterior circulation: Both internal carotid arteries widely patent
through the skull base and siphon regions. There is ordinary siphon
atherosclerotic calcification but no stenosis greater than 30%. The
anterior and middle cerebral vessels are patent without proximal
stenosis, aneurysm or vascular malformation.

Posterior circulation: Both vertebral arteries are patent to the
basilar. No basilar stenosis. Posterior circulation branch vessels
are normal. Large posterior communicating arteries on both sides.

Venous sinuses: Patent and normal.

Anatomic variants: None significant.

Review of the MIP images confirms the above findings
IMPRESSION: No large vessel occlusion.

Atherosclerotic change at both carotid bifurcations but no stenosis.

Aortic atherosclerosis.

## 2022-09-18 DIAGNOSIS — Z85828 Personal history of other malignant neoplasm of skin: Secondary | ICD-10-CM | POA: Diagnosis not present

## 2022-09-18 DIAGNOSIS — L905 Scar conditions and fibrosis of skin: Secondary | ICD-10-CM | POA: Diagnosis not present

## 2022-09-18 DIAGNOSIS — L570 Actinic keratosis: Secondary | ICD-10-CM | POA: Diagnosis not present

## 2022-09-21 DIAGNOSIS — M48062 Spinal stenosis, lumbar region with neurogenic claudication: Secondary | ICD-10-CM | POA: Diagnosis not present

## 2022-09-21 DIAGNOSIS — M545 Low back pain, unspecified: Secondary | ICD-10-CM | POA: Diagnosis not present

## 2022-09-21 DIAGNOSIS — M47816 Spondylosis without myelopathy or radiculopathy, lumbar region: Secondary | ICD-10-CM | POA: Diagnosis not present

## 2022-10-10 DIAGNOSIS — H401212 Low-tension glaucoma, right eye, moderate stage: Secondary | ICD-10-CM | POA: Diagnosis not present

## 2022-10-10 DIAGNOSIS — H353211 Exudative age-related macular degeneration, right eye, with active choroidal neovascularization: Secondary | ICD-10-CM | POA: Diagnosis not present

## 2022-10-10 DIAGNOSIS — E119 Type 2 diabetes mellitus without complications: Secondary | ICD-10-CM | POA: Diagnosis not present

## 2022-10-10 DIAGNOSIS — H40012 Open angle with borderline findings, low risk, left eye: Secondary | ICD-10-CM | POA: Diagnosis not present

## 2022-10-10 DIAGNOSIS — H524 Presbyopia: Secondary | ICD-10-CM | POA: Diagnosis not present

## 2022-10-12 DIAGNOSIS — E538 Deficiency of other specified B group vitamins: Secondary | ICD-10-CM | POA: Diagnosis not present

## 2022-11-23 DIAGNOSIS — E538 Deficiency of other specified B group vitamins: Secondary | ICD-10-CM | POA: Diagnosis not present

## 2022-11-27 ENCOUNTER — Encounter (INDEPENDENT_AMBULATORY_CARE_PROVIDER_SITE_OTHER): Payer: Medicare HMO | Admitting: Ophthalmology

## 2022-11-30 ENCOUNTER — Ambulatory Visit: Payer: Medicare HMO | Admitting: Podiatry

## 2022-11-30 ENCOUNTER — Encounter: Payer: Self-pay | Admitting: Podiatry

## 2022-11-30 VITALS — BP 141/77 | HR 85

## 2022-11-30 DIAGNOSIS — M79676 Pain in unspecified toe(s): Secondary | ICD-10-CM

## 2022-11-30 DIAGNOSIS — B351 Tinea unguium: Secondary | ICD-10-CM | POA: Diagnosis not present

## 2022-11-30 DIAGNOSIS — M47816 Spondylosis without myelopathy or radiculopathy, lumbar region: Secondary | ICD-10-CM | POA: Diagnosis not present

## 2022-11-30 DIAGNOSIS — G629 Polyneuropathy, unspecified: Secondary | ICD-10-CM | POA: Insufficient documentation

## 2022-11-30 DIAGNOSIS — E1142 Type 2 diabetes mellitus with diabetic polyneuropathy: Secondary | ICD-10-CM

## 2022-11-30 DIAGNOSIS — G894 Chronic pain syndrome: Secondary | ICD-10-CM | POA: Insufficient documentation

## 2022-11-30 DIAGNOSIS — M48062 Spinal stenosis, lumbar region with neurogenic claudication: Secondary | ICD-10-CM | POA: Diagnosis not present

## 2022-11-30 DIAGNOSIS — M5136 Other intervertebral disc degeneration, lumbar region: Secondary | ICD-10-CM | POA: Insufficient documentation

## 2022-11-30 DIAGNOSIS — M461 Sacroiliitis, not elsewhere classified: Secondary | ICD-10-CM | POA: Insufficient documentation

## 2022-11-30 NOTE — Progress Notes (Signed)
Subjective:  Patient ID: Kevin Suarez, male    DOB: 1940-11-25,  MRN: [id number] HPI Chief Complaint  Patient presents with   Debridement    Requesting toenail trim, diabetic - last A1c was 6.5, unable to trim himself   New Patient (Initial Visit)    Est pt 2018    82 y.o. male presents with the above complaint.   ROS: Denies fever chills nausea vomiting muscle aches pains.  Past Medical History:  Diagnosis Date   Arthritis    back - steroid injection   Bladder stone    BPH (benign prostatic hypertrophy)    Frequency of urination    GERD (gastroesophageal reflux disease)    H/O asbestos exposure    Hearing loss 2020   no hearing aids   History of kidney stones    surgery to remove stones   Hyperlipidemia    Hypertension    Lung nodules    LAST CT 2012  PER DR WRIGHT NOTE NO FURTHER WORK-UP (PULMOLOGIST)   Type 2 diabetes mellitus ([place])    Wears dentures    full upper   Wears glasses    Wears partial dentures    lower   Past Surgical History:  Procedure Laterality Date   CATARACT EXTRACTION W/ INTRAOCULAR LENS  IMPLANT, BILATERAL     CYSTOSCOPY WITH LITHOLAPAXY N/A 11/28/2013   Procedure: CYSTOSCOPY WITH stone retrieval;  Surgeon: Claybon Jabs, MD;  Location: [facility];  Service: Urology;  Laterality: N/A;   CYSTOSCOPY WITH LITHOLAPAXY N/A 10/15/2015   Procedure: CYSTOSCOPY WITH LITHOLAPAXY, HOLMIUM LASER LITHOTRIPSY tranurethral excision of prostate;  Surgeon: [name], MD;  [facility];  Service: Urology;  Laterality: N/A;   EXTRACORPOREAL SHOCK WAVE LITHOTRIPSY Right 05-13-2015   HOLMIUM LASER APPLICATION N/A 47/04/5464   Procedure: HOLMIUM LASER APPLICATION;  Surgeon: [name], MD;  Location: [facility];  Service: Urology;  Laterality: N/A;   TOTAL HIP ARTHROPLASTY Bilateral 2000  &  2005   TRANSURETHRAL RESECTION OF PROSTATE  11/28/2013   Procedure: TRANSURETHRAL RESECTION OF THE PROSTATE WITH   BUTTON GYRUS INSTRUMENTS;  Surgeon: Claybon Jabs, MD;  Location: [place];  Service: Urology;;   WRIST SURGERY Left 2005   INJURY   XI ROBOTIC ASSISTED INGUINAL HERNIA REPAIR WITH MESH Left 07/20/2020   Procedure: ROBOTIC LEFT INGUINAL HERNIA WITH MESH USING PROGRIP LAPAROSCOPIC SELF-FIXATING MESH 16cm x 12cm;  Surgeon: Ralene Ok, MD;  Location[facility];  Service: General;  Laterality: Left;    Current Outpatient Medications:    COMIRNATY SUSP injection, , Disp: , Rfl:    finasteride (PROSCAR) 5 MG tablet, Take 5 mg by mouth daily., Disp: , Rfl:    hydroxypropyl methylcellulose / hypromellose (ISOPTO TEARS / GONIOVISC) 2.5 % ophthalmic solution, Place 1 drop into both eyes daily as needed for dry eyes., Disp: , Rfl:    losartan (COZAAR) 25 MG tablet, Take 50 mg by mouth daily. , Disp: , Rfl:    metFORMIN (GLUCOPHAGE) 500 MG tablet, Take 500 mg by mouth 2 (two) times daily with a meal. , Disp: , Rfl:    omeprazole (PRILOSEC) 40 MG capsule, Take 40 mg by mouth daily before breakfast., Disp: , Rfl:    pantoprazole (PROTONIX) 40 MG tablet, Take 40 mg by mouth every evening. , Disp: , Rfl:    tamsulosin (FLOMAX) 0.4 MG CAPS capsule, Take 0.4 mg by mouth at bedtime., Disp: , Rfl:   Allergies  Allergen Reactions   Prednisone Other (See Comments)    Doesn't like side effects of prednisone.    Review of Systems Objective:   Vitals:   11/30/22 0850  BP: (!) 141/77  Pulse: 85    General: Well developed, nourished, in no acute distress, alert and oriented x3   Dermatological: Skin is warm, dry and supple bilateral. Nails x 10 are well maintained; remaining integument appears unremarkable at this time. There are no open sores, no preulcerative lesions, no rash or signs of infection present.  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  Vascular: Dorsalis Pedis artery and Posterior Tibial artery pedal pulses are 1/4 bilateral with immedate capillary fill  time. Pedal hair growth present. No varicosities and no lower extremity edema present bilateral.   Neruologic: Grossly intact via light touch bilateral. Vibratory intact via tuning fork bilateral. Protective threshold with Semmes Wienstein monofilament diminished to all pedal sites bilateral. Patellar and Achilles deep tendon reflexes 2+ bilateral. No Babinski or clonus noted bilateral.   Musculoskeletal: No gross boney pedal deformities bilateral. No pain, crepitus, or limitation noted with foot and ankle range of motion bilateral. Muscular strength 5/5 in all groups tested bilateral.  Gait: Unassisted, Nonantalgic.    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  Plan: Debridement toenails 1 through 5 bilateral secondary to diabetes mellitus with diabetic peripheral neuropathy.     Arles Rumbold T. [place], [place]

## 2022-12-05 DIAGNOSIS — G8929 Other chronic pain: Secondary | ICD-10-CM | POA: Diagnosis not present

## 2022-12-05 DIAGNOSIS — R262 Difficulty in walking, not elsewhere classified: Secondary | ICD-10-CM | POA: Diagnosis not present

## 2022-12-05 DIAGNOSIS — M5459 Other low back pain: Secondary | ICD-10-CM | POA: Diagnosis not present

## 2022-12-06 DIAGNOSIS — H43813 Vitreous degeneration, bilateral: Secondary | ICD-10-CM | POA: Diagnosis not present

## 2022-12-06 DIAGNOSIS — H35722 Serous detachment of retinal pigment epithelium, left eye: Secondary | ICD-10-CM | POA: Diagnosis not present

## 2022-12-06 DIAGNOSIS — H353132 Nonexudative age-related macular degeneration, bilateral, intermediate dry stage: Secondary | ICD-10-CM | POA: Diagnosis not present

## 2022-12-06 DIAGNOSIS — H353221 Exudative age-related macular degeneration, left eye, with active choroidal neovascularization: Secondary | ICD-10-CM | POA: Diagnosis not present

## 2022-12-06 DIAGNOSIS — H353211 Exudative age-related macular degeneration, right eye, with active choroidal neovascularization: Secondary | ICD-10-CM | POA: Diagnosis not present

## 2022-12-13 DIAGNOSIS — H353132 Nonexudative age-related macular degeneration, bilateral, intermediate dry stage: Secondary | ICD-10-CM | POA: Diagnosis not present

## 2022-12-13 DIAGNOSIS — H353211 Exudative age-related macular degeneration, right eye, with active choroidal neovascularization: Secondary | ICD-10-CM | POA: Diagnosis not present

## 2022-12-13 DIAGNOSIS — H35722 Serous detachment of retinal pigment epithelium, left eye: Secondary | ICD-10-CM | POA: Diagnosis not present

## 2022-12-13 DIAGNOSIS — H43813 Vitreous degeneration, bilateral: Secondary | ICD-10-CM | POA: Diagnosis not present

## 2022-12-13 DIAGNOSIS — H353221 Exudative age-related macular degeneration, left eye, with active choroidal neovascularization: Secondary | ICD-10-CM | POA: Diagnosis not present

## 2022-12-13 DIAGNOSIS — G8929 Other chronic pain: Secondary | ICD-10-CM | POA: Diagnosis not present

## 2022-12-13 DIAGNOSIS — R262 Difficulty in walking, not elsewhere classified: Secondary | ICD-10-CM | POA: Diagnosis not present

## 2022-12-13 DIAGNOSIS — M5459 Other low back pain: Secondary | ICD-10-CM | POA: Diagnosis not present

## 2022-12-15 DIAGNOSIS — R262 Difficulty in walking, not elsewhere classified: Secondary | ICD-10-CM | POA: Diagnosis not present

## 2022-12-15 DIAGNOSIS — G8929 Other chronic pain: Secondary | ICD-10-CM | POA: Diagnosis not present

## 2022-12-15 DIAGNOSIS — M5459 Other low back pain: Secondary | ICD-10-CM | POA: Diagnosis not present

## 2022-12-20 DIAGNOSIS — G8929 Other chronic pain: Secondary | ICD-10-CM | POA: Diagnosis not present

## 2022-12-20 DIAGNOSIS — M5459 Other low back pain: Secondary | ICD-10-CM | POA: Diagnosis not present

## 2022-12-20 DIAGNOSIS — R262 Difficulty in walking, not elsewhere classified: Secondary | ICD-10-CM | POA: Diagnosis not present

## 2022-12-21 DIAGNOSIS — G8929 Other chronic pain: Secondary | ICD-10-CM | POA: Diagnosis not present

## 2022-12-21 DIAGNOSIS — R262 Difficulty in walking, not elsewhere classified: Secondary | ICD-10-CM | POA: Diagnosis not present

## 2022-12-21 DIAGNOSIS — M5459 Other low back pain: Secondary | ICD-10-CM | POA: Diagnosis not present

## 2022-12-26 DIAGNOSIS — G8929 Other chronic pain: Secondary | ICD-10-CM | POA: Diagnosis not present

## 2022-12-26 DIAGNOSIS — R262 Difficulty in walking, not elsewhere classified: Secondary | ICD-10-CM | POA: Diagnosis not present

## 2022-12-26 DIAGNOSIS — M5459 Other low back pain: Secondary | ICD-10-CM | POA: Diagnosis not present

## 2022-12-28 DIAGNOSIS — M47816 Spondylosis without myelopathy or radiculopathy, lumbar region: Secondary | ICD-10-CM | POA: Diagnosis not present

## 2022-12-28 DIAGNOSIS — M791 Myalgia, unspecified site: Secondary | ICD-10-CM | POA: Diagnosis not present

## 2022-12-28 DIAGNOSIS — M48062 Spinal stenosis, lumbar region with neurogenic claudication: Secondary | ICD-10-CM | POA: Diagnosis not present

## 2023-01-02 DIAGNOSIS — R262 Difficulty in walking, not elsewhere classified: Secondary | ICD-10-CM | POA: Diagnosis not present

## 2023-01-02 DIAGNOSIS — G8929 Other chronic pain: Secondary | ICD-10-CM | POA: Diagnosis not present

## 2023-01-02 DIAGNOSIS — M5459 Other low back pain: Secondary | ICD-10-CM | POA: Diagnosis not present

## 2023-01-04 DIAGNOSIS — M5459 Other low back pain: Secondary | ICD-10-CM | POA: Diagnosis not present

## 2023-01-04 DIAGNOSIS — G8929 Other chronic pain: Secondary | ICD-10-CM | POA: Diagnosis not present

## 2023-01-04 DIAGNOSIS — R262 Difficulty in walking, not elsewhere classified: Secondary | ICD-10-CM | POA: Diagnosis not present

## 2023-01-09 DIAGNOSIS — G8929 Other chronic pain: Secondary | ICD-10-CM | POA: Diagnosis not present

## 2023-01-09 DIAGNOSIS — R262 Difficulty in walking, not elsewhere classified: Secondary | ICD-10-CM | POA: Diagnosis not present

## 2023-01-09 DIAGNOSIS — M5459 Other low back pain: Secondary | ICD-10-CM | POA: Diagnosis not present

## 2023-01-11 DIAGNOSIS — R262 Difficulty in walking, not elsewhere classified: Secondary | ICD-10-CM | POA: Diagnosis not present

## 2023-01-11 DIAGNOSIS — M5459 Other low back pain: Secondary | ICD-10-CM | POA: Diagnosis not present

## 2023-01-11 DIAGNOSIS — G8929 Other chronic pain: Secondary | ICD-10-CM | POA: Diagnosis not present

## 2023-01-16 DIAGNOSIS — M5459 Other low back pain: Secondary | ICD-10-CM | POA: Diagnosis not present

## 2023-01-16 DIAGNOSIS — G8929 Other chronic pain: Secondary | ICD-10-CM | POA: Diagnosis not present

## 2023-01-16 DIAGNOSIS — R262 Difficulty in walking, not elsewhere classified: Secondary | ICD-10-CM | POA: Diagnosis not present

## 2023-01-17 DIAGNOSIS — E538 Deficiency of other specified B group vitamins: Secondary | ICD-10-CM | POA: Diagnosis not present

## 2023-01-22 DIAGNOSIS — M47816 Spondylosis without myelopathy or radiculopathy, lumbar region: Secondary | ICD-10-CM | POA: Diagnosis not present

## 2023-01-23 DIAGNOSIS — K219 Gastro-esophageal reflux disease without esophagitis: Secondary | ICD-10-CM | POA: Diagnosis not present

## 2023-01-23 DIAGNOSIS — E1129 Type 2 diabetes mellitus with other diabetic kidney complication: Secondary | ICD-10-CM | POA: Diagnosis not present

## 2023-01-23 DIAGNOSIS — E7800 Pure hypercholesterolemia, unspecified: Secondary | ICD-10-CM | POA: Diagnosis not present

## 2023-01-23 DIAGNOSIS — Z125 Encounter for screening for malignant neoplasm of prostate: Secondary | ICD-10-CM | POA: Diagnosis not present

## 2023-01-24 DIAGNOSIS — H35722 Serous detachment of retinal pigment epithelium, left eye: Secondary | ICD-10-CM | POA: Diagnosis not present

## 2023-01-24 DIAGNOSIS — H353211 Exudative age-related macular degeneration, right eye, with active choroidal neovascularization: Secondary | ICD-10-CM | POA: Diagnosis not present

## 2023-01-24 DIAGNOSIS — H353221 Exudative age-related macular degeneration, left eye, with active choroidal neovascularization: Secondary | ICD-10-CM | POA: Diagnosis not present

## 2023-01-24 DIAGNOSIS — H43813 Vitreous degeneration, bilateral: Secondary | ICD-10-CM | POA: Diagnosis not present

## 2023-01-30 ENCOUNTER — Other Ambulatory Visit: Payer: Self-pay | Admitting: Internal Medicine

## 2023-01-30 DIAGNOSIS — E7800 Pure hypercholesterolemia, unspecified: Secondary | ICD-10-CM | POA: Diagnosis not present

## 2023-01-30 DIAGNOSIS — I6523 Occlusion and stenosis of bilateral carotid arteries: Secondary | ICD-10-CM | POA: Diagnosis not present

## 2023-01-30 DIAGNOSIS — E669 Obesity, unspecified: Secondary | ICD-10-CM | POA: Diagnosis not present

## 2023-01-30 DIAGNOSIS — R972 Elevated prostate specific antigen [PSA]: Secondary | ICD-10-CM | POA: Diagnosis not present

## 2023-01-30 DIAGNOSIS — R252 Cramp and spasm: Secondary | ICD-10-CM | POA: Diagnosis not present

## 2023-01-30 DIAGNOSIS — N182 Chronic kidney disease, stage 2 (mild): Secondary | ICD-10-CM | POA: Diagnosis not present

## 2023-01-30 DIAGNOSIS — Z1331 Encounter for screening for depression: Secondary | ICD-10-CM | POA: Diagnosis not present

## 2023-01-30 DIAGNOSIS — E1140 Type 2 diabetes mellitus with diabetic neuropathy, unspecified: Secondary | ICD-10-CM | POA: Diagnosis not present

## 2023-01-30 DIAGNOSIS — Z0000 Encounter for general adult medical examination without abnormal findings: Secondary | ICD-10-CM | POA: Diagnosis not present

## 2023-01-30 DIAGNOSIS — H9193 Unspecified hearing loss, bilateral: Secondary | ICD-10-CM | POA: Diagnosis not present

## 2023-01-30 DIAGNOSIS — I700 Atherosclerosis of aorta: Secondary | ICD-10-CM | POA: Diagnosis not present

## 2023-01-30 DIAGNOSIS — R82998 Other abnormal findings in urine: Secondary | ICD-10-CM | POA: Diagnosis not present

## 2023-01-30 DIAGNOSIS — E1129 Type 2 diabetes mellitus with other diabetic kidney complication: Secondary | ICD-10-CM | POA: Diagnosis not present

## 2023-01-30 DIAGNOSIS — Z1339 Encounter for screening examination for other mental health and behavioral disorders: Secondary | ICD-10-CM | POA: Diagnosis not present

## 2023-01-30 DIAGNOSIS — I129 Hypertensive chronic kidney disease with stage 1 through stage 4 chronic kidney disease, or unspecified chronic kidney disease: Secondary | ICD-10-CM | POA: Diagnosis not present

## 2023-01-31 DIAGNOSIS — H353211 Exudative age-related macular degeneration, right eye, with active choroidal neovascularization: Secondary | ICD-10-CM | POA: Diagnosis not present

## 2023-01-31 DIAGNOSIS — H353221 Exudative age-related macular degeneration, left eye, with active choroidal neovascularization: Secondary | ICD-10-CM | POA: Diagnosis not present

## 2023-01-31 DIAGNOSIS — H35722 Serous detachment of retinal pigment epithelium, left eye: Secondary | ICD-10-CM | POA: Diagnosis not present

## 2023-01-31 DIAGNOSIS — H43813 Vitreous degeneration, bilateral: Secondary | ICD-10-CM | POA: Diagnosis not present

## 2023-01-31 DIAGNOSIS — H353132 Nonexudative age-related macular degeneration, bilateral, intermediate dry stage: Secondary | ICD-10-CM | POA: Diagnosis not present

## 2023-02-20 DIAGNOSIS — M47816 Spondylosis without myelopathy or radiculopathy, lumbar region: Secondary | ICD-10-CM | POA: Diagnosis not present

## 2023-02-28 ENCOUNTER — Ambulatory Visit
Admission: RE | Admit: 2023-02-28 | Discharge: 2023-02-28 | Disposition: A | Discharge status: Home / Self Care | Payer: Medicare HMO | Source: Ambulatory Visit | Attending: Internal Medicine | Admitting: Internal Medicine

## 2023-02-28 DIAGNOSIS — I6523 Occlusion and stenosis of bilateral carotid arteries: Secondary | ICD-10-CM | POA: Diagnosis not present

## 2023-02-28 DIAGNOSIS — R252 Cramp and spasm: Secondary | ICD-10-CM

## 2023-03-01 DIAGNOSIS — E538 Deficiency of other specified B group vitamins: Secondary | ICD-10-CM | POA: Diagnosis not present

## 2023-03-06 DIAGNOSIS — M47816 Spondylosis without myelopathy or radiculopathy, lumbar region: Secondary | ICD-10-CM | POA: Diagnosis not present

## 2023-03-15 ENCOUNTER — Ambulatory Visit: Payer: Medicare HMO | Admitting: Podiatry

## 2023-03-22 ENCOUNTER — Ambulatory Visit: Payer: Medicare HMO | Admitting: Podiatry

## 2023-03-22 DIAGNOSIS — M79676 Pain in unspecified toe(s): Secondary | ICD-10-CM

## 2023-03-22 DIAGNOSIS — B351 Tinea unguium: Secondary | ICD-10-CM | POA: Diagnosis not present

## 2023-03-22 DIAGNOSIS — E1142 Type 2 diabetes mellitus with diabetic polyneuropathy: Secondary | ICD-10-CM

## 2023-03-22 NOTE — Progress Notes (Signed)
Presents today for diabetic routine footcare.  States he has been doing just fine toenails are painful.  No numbness or tingling.  States that his hemoglobin A1c is 7.0 and his circulating blood sugar is 100 mg/dL.  Objective: Pulses are palpable.  No change in neurologic sensorium.  Toenails are long thick yellow dystrophic with mycotic sharply incurvated painful palpation 1 through 5 bilaterally.  No open lesions or wounds are identifiable.  Assessment: Pain in limb secondary to diabetes as well as painful elongated incurvated toenails.  Plan: Debridement of toenails 1 through 5 bilateral.

## 2023-03-28 DIAGNOSIS — H353132 Nonexudative age-related macular degeneration, bilateral, intermediate dry stage: Secondary | ICD-10-CM | POA: Diagnosis not present

## 2023-03-28 DIAGNOSIS — H353211 Exudative age-related macular degeneration, right eye, with active choroidal neovascularization: Secondary | ICD-10-CM | POA: Diagnosis not present

## 2023-03-28 DIAGNOSIS — H43813 Vitreous degeneration, bilateral: Secondary | ICD-10-CM | POA: Diagnosis not present

## 2023-03-28 DIAGNOSIS — H353221 Exudative age-related macular degeneration, left eye, with active choroidal neovascularization: Secondary | ICD-10-CM | POA: Diagnosis not present

## 2023-03-28 DIAGNOSIS — H35722 Serous detachment of retinal pigment epithelium, left eye: Secondary | ICD-10-CM | POA: Diagnosis not present

## 2023-04-05 DIAGNOSIS — M47816 Spondylosis without myelopathy or radiculopathy, lumbar region: Secondary | ICD-10-CM | POA: Diagnosis not present

## 2023-04-05 DIAGNOSIS — M5416 Radiculopathy, lumbar region: Secondary | ICD-10-CM | POA: Diagnosis not present

## 2023-04-05 DIAGNOSIS — M791 Myalgia, unspecified site: Secondary | ICD-10-CM | POA: Diagnosis not present

## 2023-04-25 DIAGNOSIS — E538 Deficiency of other specified B group vitamins: Secondary | ICD-10-CM | POA: Diagnosis not present

## 2023-05-02 DIAGNOSIS — M5416 Radiculopathy, lumbar region: Secondary | ICD-10-CM | POA: Diagnosis not present

## 2023-05-09 DIAGNOSIS — H43813 Vitreous degeneration, bilateral: Secondary | ICD-10-CM | POA: Diagnosis not present

## 2023-05-09 DIAGNOSIS — H353211 Exudative age-related macular degeneration, right eye, with active choroidal neovascularization: Secondary | ICD-10-CM | POA: Diagnosis not present

## 2023-05-09 DIAGNOSIS — H35722 Serous detachment of retinal pigment epithelium, left eye: Secondary | ICD-10-CM | POA: Diagnosis not present

## 2023-05-09 DIAGNOSIS — H43391 Other vitreous opacities, right eye: Secondary | ICD-10-CM | POA: Diagnosis not present

## 2023-05-09 DIAGNOSIS — H353221 Exudative age-related macular degeneration, left eye, with active choroidal neovascularization: Secondary | ICD-10-CM | POA: Diagnosis not present

## 2023-05-16 DIAGNOSIS — M5416 Radiculopathy, lumbar region: Secondary | ICD-10-CM | POA: Diagnosis not present

## 2023-05-16 DIAGNOSIS — M47816 Spondylosis without myelopathy or radiculopathy, lumbar region: Secondary | ICD-10-CM | POA: Diagnosis not present

## 2023-06-20 DIAGNOSIS — H52203 Unspecified astigmatism, bilateral: Secondary | ICD-10-CM | POA: Diagnosis not present

## 2023-06-20 DIAGNOSIS — H26492 Other secondary cataract, left eye: Secondary | ICD-10-CM | POA: Diagnosis not present

## 2023-06-20 DIAGNOSIS — H43813 Vitreous degeneration, bilateral: Secondary | ICD-10-CM | POA: Diagnosis not present

## 2023-06-20 DIAGNOSIS — H524 Presbyopia: Secondary | ICD-10-CM | POA: Diagnosis not present

## 2023-06-20 DIAGNOSIS — H04123 Dry eye syndrome of bilateral lacrimal glands: Secondary | ICD-10-CM | POA: Diagnosis not present

## 2023-06-20 DIAGNOSIS — E119 Type 2 diabetes mellitus without complications: Secondary | ICD-10-CM | POA: Diagnosis not present

## 2023-06-20 DIAGNOSIS — H353221 Exudative age-related macular degeneration, left eye, with active choroidal neovascularization: Secondary | ICD-10-CM | POA: Diagnosis not present

## 2023-06-21 DIAGNOSIS — H353132 Nonexudative age-related macular degeneration, bilateral, intermediate dry stage: Secondary | ICD-10-CM | POA: Diagnosis not present

## 2023-06-21 DIAGNOSIS — H35722 Serous detachment of retinal pigment epithelium, left eye: Secondary | ICD-10-CM | POA: Diagnosis not present

## 2023-06-21 DIAGNOSIS — H43813 Vitreous degeneration, bilateral: Secondary | ICD-10-CM | POA: Diagnosis not present

## 2023-06-21 DIAGNOSIS — H43391 Other vitreous opacities, right eye: Secondary | ICD-10-CM | POA: Diagnosis not present

## 2023-06-21 DIAGNOSIS — H353221 Exudative age-related macular degeneration, left eye, with active choroidal neovascularization: Secondary | ICD-10-CM | POA: Diagnosis not present

## 2023-06-26 ENCOUNTER — Encounter: Payer: Self-pay | Admitting: Podiatrist

## 2023-06-26 ENCOUNTER — Ambulatory Visit: Payer: Medicare HMO | Admitting: Podiatrist

## 2023-06-26 DIAGNOSIS — M79676 Pain in unspecified toe(s): Secondary | ICD-10-CM

## 2023-06-26 DIAGNOSIS — B351 Tinea unguium: Secondary | ICD-10-CM | POA: Diagnosis not present

## 2023-06-26 DIAGNOSIS — E1142 Type 2 diabetes mellitus with diabetic polyneuropathy: Secondary | ICD-10-CM | POA: Diagnosis not present

## 2023-06-26 NOTE — Progress Notes (Signed)
Presents today for diabetic routine footcare.  States he has been doing well overall. relates toenails are painful in shoes and ambulation.  No numbness or tingling.  States that his hemoglobin A1c is 7.0    Objective: Pulses are palpable.  No change in neurologic sensorium.  Toenails are long thick yellow dystrophic with mycotic sharply incurvated painful palpation 1 through 5 bilaterally.  No open lesions or wounds are identifiable.  Assessment: Pain in limb secondary to diabetes as well as painful elongated incurvated toenails.  Plan: Debridement of toenails 1 through 5 bilateral. He will return in 3 months for continued preventive foot care/ diabetic foot care.

## 2023-07-12 DIAGNOSIS — M5416 Radiculopathy, lumbar region: Secondary | ICD-10-CM | POA: Diagnosis not present

## 2023-07-12 DIAGNOSIS — M48062 Spinal stenosis, lumbar region with neurogenic claudication: Secondary | ICD-10-CM | POA: Diagnosis not present

## 2023-07-13 ENCOUNTER — Other Ambulatory Visit: Payer: Self-pay | Admitting: Orthopaedic Surgery

## 2023-07-13 DIAGNOSIS — M5416 Radiculopathy, lumbar region: Secondary | ICD-10-CM

## 2023-07-25 DIAGNOSIS — E538 Deficiency of other specified B group vitamins: Secondary | ICD-10-CM | POA: Diagnosis not present

## 2023-07-25 DIAGNOSIS — Z23 Encounter for immunization: Secondary | ICD-10-CM | POA: Diagnosis not present

## 2023-07-26 DIAGNOSIS — H353211 Exudative age-related macular degeneration, right eye, with active choroidal neovascularization: Secondary | ICD-10-CM | POA: Diagnosis not present

## 2023-07-26 DIAGNOSIS — H353132 Nonexudative age-related macular degeneration, bilateral, intermediate dry stage: Secondary | ICD-10-CM | POA: Diagnosis not present

## 2023-07-26 DIAGNOSIS — H353221 Exudative age-related macular degeneration, left eye, with active choroidal neovascularization: Secondary | ICD-10-CM | POA: Diagnosis not present

## 2023-07-26 DIAGNOSIS — H43813 Vitreous degeneration, bilateral: Secondary | ICD-10-CM | POA: Diagnosis not present

## 2023-07-26 DIAGNOSIS — H35722 Serous detachment of retinal pigment epithelium, left eye: Secondary | ICD-10-CM | POA: Diagnosis not present

## 2023-07-26 DIAGNOSIS — H43391 Other vitreous opacities, right eye: Secondary | ICD-10-CM | POA: Diagnosis not present

## 2023-07-29 ENCOUNTER — Inpatient Hospital Stay (HOSPITAL_COMMUNITY): Payer: Medicare HMO

## 2023-07-29 ENCOUNTER — Emergency Department (HOSPITAL_COMMUNITY): Payer: Medicare HMO

## 2023-07-29 ENCOUNTER — Inpatient Hospital Stay (HOSPITAL_COMMUNITY): Payer: Medicare HMO | Admitting: Anesthesiology

## 2023-07-29 ENCOUNTER — Other Ambulatory Visit: Payer: Self-pay

## 2023-07-29 ENCOUNTER — Encounter (HOSPITAL_COMMUNITY): Payer: Self-pay | Admitting: Emergency Medicine

## 2023-07-29 ENCOUNTER — Inpatient Hospital Stay (HOSPITAL_COMMUNITY)
Admission: EM | Admit: 2023-07-29 | Discharge: 2023-08-03 | DRG: 536 | Disposition: A | Discharge status: Home Health | Payer: Medicare HMO | Attending: Internal Medicine | Admitting: Internal Medicine

## 2023-07-29 DIAGNOSIS — R918 Other nonspecific abnormal finding of lung field: Secondary | ICD-10-CM | POA: Diagnosis not present

## 2023-07-29 DIAGNOSIS — D649 Anemia, unspecified: Secondary | ICD-10-CM | POA: Diagnosis not present

## 2023-07-29 DIAGNOSIS — Z96641 Presence of right artificial hip joint: Secondary | ICD-10-CM | POA: Diagnosis not present

## 2023-07-29 DIAGNOSIS — W010XXA Fall on same level from slipping, tripping and stumbling without subsequent striking against object, initial encounter: Secondary | ICD-10-CM | POA: Diagnosis not present

## 2023-07-29 DIAGNOSIS — M1712 Unilateral primary osteoarthritis, left knee: Secondary | ICD-10-CM | POA: Diagnosis not present

## 2023-07-29 DIAGNOSIS — M25552 Pain in left hip: Secondary | ICD-10-CM | POA: Diagnosis not present

## 2023-07-29 DIAGNOSIS — Z77090 Contact with and (suspected) exposure to asbestos: Secondary | ICD-10-CM | POA: Diagnosis present

## 2023-07-29 DIAGNOSIS — I129 Hypertensive chronic kidney disease with stage 1 through stage 4 chronic kidney disease, or unspecified chronic kidney disease: Secondary | ICD-10-CM | POA: Diagnosis not present

## 2023-07-29 DIAGNOSIS — H9190 Unspecified hearing loss, unspecified ear: Secondary | ICD-10-CM | POA: Diagnosis present

## 2023-07-29 DIAGNOSIS — N179 Acute kidney failure, unspecified: Secondary | ICD-10-CM | POA: Diagnosis not present

## 2023-07-29 DIAGNOSIS — Z961 Presence of intraocular lens: Secondary | ICD-10-CM | POA: Diagnosis present

## 2023-07-29 DIAGNOSIS — D509 Iron deficiency anemia, unspecified: Secondary | ICD-10-CM | POA: Diagnosis present

## 2023-07-29 DIAGNOSIS — N400 Enlarged prostate without lower urinary tract symptoms: Secondary | ICD-10-CM | POA: Diagnosis not present

## 2023-07-29 DIAGNOSIS — Z9079 Acquired absence of other genital organ(s): Secondary | ICD-10-CM | POA: Diagnosis not present

## 2023-07-29 DIAGNOSIS — Z8249 Family history of ischemic heart disease and other diseases of the circulatory system: Secondary | ICD-10-CM | POA: Diagnosis not present

## 2023-07-29 DIAGNOSIS — E1122 Type 2 diabetes mellitus with diabetic chronic kidney disease: Secondary | ICD-10-CM | POA: Diagnosis present

## 2023-07-29 DIAGNOSIS — E785 Hyperlipidemia, unspecified: Secondary | ICD-10-CM | POA: Diagnosis not present

## 2023-07-29 DIAGNOSIS — G8918 Other acute postprocedural pain: Secondary | ICD-10-CM | POA: Diagnosis not present

## 2023-07-29 DIAGNOSIS — M25559 Pain in unspecified hip: Secondary | ICD-10-CM | POA: Diagnosis not present

## 2023-07-29 DIAGNOSIS — S7292XA Unspecified fracture of left femur, initial encounter for closed fracture: Secondary | ICD-10-CM | POA: Diagnosis not present

## 2023-07-29 DIAGNOSIS — N401 Enlarged prostate with lower urinary tract symptoms: Secondary | ICD-10-CM | POA: Diagnosis not present

## 2023-07-29 DIAGNOSIS — N183 Chronic kidney disease, stage 3 unspecified: Secondary | ICD-10-CM | POA: Diagnosis present

## 2023-07-29 DIAGNOSIS — Z833 Family history of diabetes mellitus: Secondary | ICD-10-CM | POA: Diagnosis not present

## 2023-07-29 DIAGNOSIS — Z7982 Long term (current) use of aspirin: Secondary | ICD-10-CM

## 2023-07-29 DIAGNOSIS — K219 Gastro-esophageal reflux disease without esophagitis: Secondary | ICD-10-CM | POA: Diagnosis present

## 2023-07-29 DIAGNOSIS — S72002A Fracture of unspecified part of neck of left femur, initial encounter for closed fracture: Secondary | ICD-10-CM | POA: Diagnosis not present

## 2023-07-29 DIAGNOSIS — I951 Orthostatic hypotension: Secondary | ICD-10-CM | POA: Diagnosis not present

## 2023-07-29 DIAGNOSIS — Z87891 Personal history of nicotine dependence: Secondary | ICD-10-CM

## 2023-07-29 DIAGNOSIS — Z79899 Other long term (current) drug therapy: Secondary | ICD-10-CM | POA: Diagnosis not present

## 2023-07-29 DIAGNOSIS — I77810 Thoracic aortic ectasia: Secondary | ICD-10-CM | POA: Diagnosis not present

## 2023-07-29 DIAGNOSIS — N1832 Chronic kidney disease, stage 3b: Secondary | ICD-10-CM | POA: Diagnosis present

## 2023-07-29 DIAGNOSIS — S72002D Fracture of unspecified part of neck of left femur, subsequent encounter for closed fracture with routine healing: Secondary | ICD-10-CM | POA: Diagnosis not present

## 2023-07-29 DIAGNOSIS — E119 Type 2 diabetes mellitus without complications: Secondary | ICD-10-CM

## 2023-07-29 DIAGNOSIS — Z7984 Long term (current) use of oral hypoglycemic drugs: Secondary | ICD-10-CM | POA: Diagnosis not present

## 2023-07-29 DIAGNOSIS — Z96642 Presence of left artificial hip joint: Secondary | ICD-10-CM | POA: Diagnosis not present

## 2023-07-29 DIAGNOSIS — N1831 Chronic kidney disease, stage 3a: Secondary | ICD-10-CM | POA: Diagnosis not present

## 2023-07-29 DIAGNOSIS — M9702XA Periprosthetic fracture around internal prosthetic left hip joint, initial encounter: Secondary | ICD-10-CM | POA: Diagnosis not present

## 2023-07-29 DIAGNOSIS — J929 Pleural plaque without asbestos: Secondary | ICD-10-CM | POA: Diagnosis not present

## 2023-07-29 DIAGNOSIS — Z96643 Presence of artificial hip joint, bilateral: Secondary | ICD-10-CM | POA: Diagnosis not present

## 2023-07-29 DIAGNOSIS — I10 Essential (primary) hypertension: Secondary | ICD-10-CM | POA: Diagnosis not present

## 2023-07-29 DIAGNOSIS — S299XXA Unspecified injury of thorax, initial encounter: Secondary | ICD-10-CM | POA: Diagnosis not present

## 2023-07-29 LAB — CBC WITH DIFFERENTIAL/PLATELET
Abs Immature Granulocytes: 0.08 10*3/uL — ABNORMAL HIGH (ref 0.00–0.07)
Basophils Absolute: 0 10*3/uL (ref 0.0–0.1)
Basophils Relative: 0 %
Eosinophils Absolute: 0.1 10*3/uL (ref 0.0–0.5)
Eosinophils Relative: 1 %
HCT: 32.9 % — ABNORMAL LOW (ref 39.0–52.0)
Hemoglobin: 10.7 g/dL — ABNORMAL LOW (ref 13.0–17.0)
Immature Granulocytes: 1 %
Lymphocytes Relative: 15 %
Lymphs Abs: 1.9 10*3/uL (ref 0.7–4.0)
MCH: 27.5 pg (ref 26.0–34.0)
MCHC: 32.5 g/dL (ref 30.0–36.0)
MCV: 84.6 fL (ref 80.0–100.0)
Monocytes Absolute: 0.6 10*3/uL (ref 0.1–1.0)
Monocytes Relative: 5 %
Neutro Abs: 9.5 10*3/uL — ABNORMAL HIGH (ref 1.7–7.7)
Neutrophils Relative %: 78 %
Platelets: 224 10*3/uL (ref 150–400)
RBC: 3.89 MIL/uL — ABNORMAL LOW (ref 4.22–5.81)
RDW: 13.5 % (ref 11.5–15.5)
WBC: 12.2 10*3/uL — ABNORMAL HIGH (ref 4.0–10.5)
nRBC: 0 % (ref 0.0–0.2)

## 2023-07-29 LAB — COMPREHENSIVE METABOLIC PANEL
ALT: 13 U/L (ref 0–44)
AST: 17 U/L (ref 15–41)
Albumin: 3.6 g/dL (ref 3.5–5.0)
Alkaline Phosphatase: 46 U/L (ref 38–126)
Anion gap: 10 (ref 5–15)
BUN: 25 mg/dL — ABNORMAL HIGH (ref 8–23)
CO2: 20 mmol/L — ABNORMAL LOW (ref 22–32)
Calcium: 8.9 mg/dL (ref 8.9–10.3)
Chloride: 107 mmol/L (ref 98–111)
Creatinine, Ser: 1.81 mg/dL — ABNORMAL HIGH (ref 0.61–1.24)
GFR, Estimated: 37 mL/min — ABNORMAL LOW (ref >60)
Glucose, Bld: 103 mg/dL — ABNORMAL HIGH (ref 70–99)
Potassium: 4 mmol/L (ref 3.5–5.1)
Sodium: 137 mmol/L (ref 135–145)
Total Bilirubin: 0.7 mg/dL (ref 0.3–1.2)
Total Protein: 6.5 g/dL (ref 6.5–8.1)

## 2023-07-29 LAB — ABO/RH: ABO/RH(D): A POS

## 2023-07-29 LAB — GLUCOSE, CAPILLARY
Glucose-Capillary: 107 mg/dL — ABNORMAL HIGH (ref 70–99)
Glucose-Capillary: 93 mg/dL (ref 70–99)

## 2023-07-29 LAB — CBG MONITORING, ED: Glucose-Capillary: 82 mg/dL (ref 70–99)

## 2023-07-29 MED ORDER — MORPHINE SULFATE (PF) 2 MG/ML IV SOLN: 0.5000 mg | INTRAVENOUS | Status: DC | PRN

## 2023-07-29 MED ORDER — MORPHINE SULFATE (PF) 4 MG/ML IV SOLN
4.0000 mg | Freq: Once | INTRAVENOUS | Status: AC
  Administered 2023-07-29: 4 mg via INTRAVENOUS
  Filled 2023-07-29: qty 1

## 2023-07-29 MED ORDER — TAMSULOSIN HCL 0.4 MG PO CAPS
0.4000 mg | ORAL_CAPSULE | Freq: Every day | ORAL | Status: DC
  Administered 2023-07-29 – 2023-08-02 (×5): 0.4 mg via ORAL
  Filled 2023-07-29 (×5): qty 1

## 2023-07-29 MED ORDER — BUPIVACAINE-EPINEPHRINE (PF) 0.5% -1:200000 IJ SOLN
INTRAMUSCULAR | Status: DC | PRN
Start: 2023-07-29 — End: 2023-07-29
  Administered 2023-07-29: 30 mL via PERINEURAL

## 2023-07-29 MED ORDER — FENTANYL CITRATE PF 50 MCG/ML IJ SOSY: 50.0000 ug | PREFILLED_SYRINGE | Freq: Once | INTRAMUSCULAR | Status: AC

## 2023-07-29 MED ORDER — ONDANSETRON HCL 4 MG/2ML IJ SOLN
INTRAMUSCULAR | Status: AC
  Administered 2023-07-29: 4 mg
  Filled 2023-07-29: qty 2

## 2023-07-29 MED ORDER — ONDANSETRON HCL 4 MG/2ML IJ SOLN
4.0000 mg | Freq: Four times a day (QID) | INTRAMUSCULAR | Status: DC | PRN
  Administered 2023-07-30: 4 mg via INTRAVENOUS
  Filled 2023-07-29 (×2): qty 2

## 2023-07-29 MED ORDER — METHOCARBAMOL 500 MG PO TABS
500.0000 mg | ORAL_TABLET | Freq: Four times a day (QID) | ORAL | Status: DC | PRN
  Administered 2023-07-30 – 2023-07-31 (×2): 500 mg via ORAL
  Filled 2023-07-29 (×2): qty 1

## 2023-07-29 MED ORDER — ONDANSETRON HCL 4 MG/2ML IJ SOLN
4.0000 mg | Freq: Once | INTRAMUSCULAR | Status: AC
  Administered 2023-07-29: 4 mg via INTRAVENOUS
  Filled 2023-07-29: qty 2

## 2023-07-29 MED ORDER — HYDROCODONE-ACETAMINOPHEN 5-325 MG PO TABS
1.0000 | ORAL_TABLET | Freq: Four times a day (QID) | ORAL | Status: DC | PRN
  Administered 2023-07-30 – 2023-08-02 (×4): 2 via ORAL
  Filled 2023-07-29 (×4): qty 2

## 2023-07-29 MED ORDER — FINASTERIDE 5 MG PO TABS
5.0000 mg | ORAL_TABLET | Freq: Every day | ORAL | Status: DC
  Administered 2023-07-30 – 2023-08-03 (×5): 5 mg via ORAL
  Filled 2023-07-29 (×5): qty 1

## 2023-07-29 MED ORDER — ONDANSETRON HCL 4 MG/2ML IJ SOLN: 4.0000 mg | Freq: Once | INTRAMUSCULAR | Status: AC

## 2023-07-29 MED ORDER — FENTANYL CITRATE (PF) 100 MCG/2ML IJ SOLN
INTRAMUSCULAR | Status: AC
  Administered 2023-07-29: 50 ug
  Filled 2023-07-29: qty 2

## 2023-07-29 MED ORDER — METHOCARBAMOL 1000 MG/10ML IJ SOLN: 500.0000 mg | Freq: Four times a day (QID) | INTRAVENOUS | Status: DC | PRN

## 2023-07-29 MED ORDER — INSULIN ASPART 100 UNIT/ML IJ SOLN
0.0000 [IU] | INTRAMUSCULAR | Status: DC
  Administered 2023-07-30: 2 [IU] via SUBCUTANEOUS
  Administered 2023-07-30: 1 [IU] via SUBCUTANEOUS
  Administered 2023-07-31: 2 [IU] via SUBCUTANEOUS
  Administered 2023-08-01 – 2023-08-02 (×4): 1 [IU] via SUBCUTANEOUS

## 2023-07-29 NOTE — Assessment & Plan Note (Signed)
Continue Proscar and Flomax when able to tolerate PO

## 2023-07-29 NOTE — Assessment & Plan Note (Signed)
-   management as per orthopedics,  plan to operate   in  a.m.   Keep nothing by mouth post midnight. 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

## 2023-07-29 NOTE — Assessment & Plan Note (Signed)
Continue Flomax 0.4mg  po qday Proscar 5 mg po qday

## 2023-07-29 NOTE — Anesthesia Procedure Notes (Addendum)
Anesthesia Regional Block: Femoral nerve block   Pre-Anesthetic Checklist: , timeout performed,  Correct Patient, Correct Site, Correct Laterality,  Correct Procedure, Correct Position, site marked,  Risks and benefits discussed,  Surgical consent,  Pre-op evaluation,  At surgeon's request and post-op pain management  Laterality: Left and Lower  Prep: chloraprep       Needles:  Injection technique: Single-shot  Needle Type: Echogenic Needle     Needle Length: 9cm  Needle Gauge: 21     Additional Needles:   Procedures:,,,, ultrasound used (permanent image in chart),,    Narrative:  Start time: 07/29/2023 8:58 AM End time: 07/29/2023 9:18 AM Injection made incrementally with aspirations every 5 mL.  Performed by: Personally  Anesthesiologist: Jairo Ben, MD  Additional Notes: Pt identified in Holding room.  Monitors applied. Working IV access confirmed. Timeout, Sterile prep L inguinal crease.  #21ga ECHOgenic Arrow block needle to femoral nerve with US guidance.  30cc 0.5% Bupivacaine 1:200k epi injected incrementally after negative test dose.  Patient asymptomatic, VSS, no heme aspirated, tolerated well.   Sandford Craze, MD

## 2023-07-29 NOTE — Assessment & Plan Note (Signed)
Obtain anemia panel  Transfuse for Hg <7 , rapidly dropping or  if symptomatic

## 2023-07-29 NOTE — H&P (Signed)
Kevin Suarez ZOX:[id number] DOB: Dec 07, 1940 DOA: 07/29/2023     PCP: Gaspar Garbe, MD   [facility]   Orthopedics 82 yo ago Urology Dr. Not sure about the name  Patient arrived to ER on 07/29/23 at 1433 Referred by Attending Therisa Doyne, MD   Patient coming from:    home Lives With family  m Chief Complaint:   Chief Complaint  Patient presents with   Hip Pain    HPI: Kevin Suarez is a 82 y.o. male with medical history significant of DM2, CKD 3b, Spondylosis anemia    Presented with  fall He tripped today did not hit head no LOC Severe left hip pain right away It happened in the garage Was outside for about 1 hour before he was found Denies any CP or SOB  He is on Aspirin 81 mg po q day just because No hx of CAD, stroke,   no COPD no CHF Before the fall he could walk up a flight of stairs could walk to mail box But has some limitation bc of back pain but not CP or SOB has  CKD stage 3a    Denies significant ETOH intake   Does not smoke   Lab Results  Component Value Date   SARSCOV2NAA NEGATIVE 07/16/2020       Regarding pertinent Chronic problems:      HTN on cozaar?  DM 2 -  Lab Results  Component Value Date   HGBA1C 7.0 (H) 07/14/2020   PO meds only     CKD stage IIIa-   baseline Cr 1.4 CrCl cannot be calculated (Unknown ideal weight.).  Lab Results  Component Value Date   CREATININE 1.81 (H) 07/29/2023   CREATININE 1.39 (H) 07/14/2020   CREATININE 1.21 08/02/2019   Lab Results  Component Value Date   NA 137 07/29/2023   CL 107 07/29/2023   K 4.0 07/29/2023   CO2 20 (L) 07/29/2023   BUN 25 (H) 07/29/2023   CREATININE 1.81 (H) 07/29/2023   GFRNONAA 37 (L) 07/29/2023   CALCIUM 8.9 07/29/2023   ALBUMIN 3.6 07/29/2023   GLUCOSE 103 (H) 07/29/2023      BPH - on Flomax, Proscar        Chronic anemia - baseline hg Hemoglobin & Hematocrit  Recent Labs    07/29/23 1520  HGB 10.7*   Iron/TIBC/Ferritin/ %Sat     Component Value Date/Time   IRON 20 (L) 10/21/2015 1505   TIBC 223 (L) 10/21/2015 1505   FERRITIN 192 10/21/2015 1505   IRONPCTSAT 9 (L) 10/21/2015 1505     While in ER:      Hip imaging showed left hip fracture     Lab Orders         Comprehensive metabolic panel         CBC with Differential       CXR - Curved line extending along the periphery of the left lung is suspected to be a soft tissue shadow. Cannot completely exclude a pneumothorax, however. Recommend repeat radiographs, with attempt to have the patient physician true AP. Current exam is rotated to the right.     Pelvis Acute fracture of the proximal left femur in the subtrochanteric region displaced by few mm. Otherwise unremarkable status post bilateral total hip arthroplasty.  Following Medications were ordered in ER: Medications  morphine (PF) 4 MG/ML injection 4 mg (4 mg Intravenous Given 07/29/23 1540)  ondansetron (ZOFRAN) injection 4 mg (4 mg  Intravenous Given 07/29/23 1538)    _______________________________________________________ ER Provider Called:     orthopedics  Dr. Carola Frost They Recommend admit to medicine   Will see in AM       ED Triage Vitals  Encounter Vitals Group     BP 07/29/23 1444 (!) 93/57     Systolic BP Percentile --      Diastolic BP Percentile --      Pulse Rate 07/29/23 1444 99     Resp 07/29/23 1444 16     Temp 07/29/23 1444 97.8 F (36.6 C)     Temp Source 07/29/23 1826 Oral     SpO2 07/29/23 1444 100 %     Weight --      Height --      Head Circumference --      Peak Flow --      Pain Score 07/29/23 1444 10     Pain Loc --      Pain Education --      Exclude from Growth Chart --   ZOXW(96)@     _________________________________________ Significant initial  Findings: Abnormal Labs Reviewed  COMPREHENSIVE METABOLIC PANEL - Abnormal; Notable for the following components:      Result Value   CO2 20 (*)    Glucose, Bld 103 (*)    BUN 25 (*)    Creatinine, Ser  1.81 (*)    GFR, Estimated 37 (*)    All other components within normal limits  CBC WITH DIFFERENTIAL/PLATELET - Abnormal; Notable for the following components:   WBC 12.2 (*)    RBC 3.89 (*)    Hemoglobin 10.7 (*)    HCT 32.9 (*)    Neutro Abs 9.5 (*)    Abs Immature Granulocytes 0.08 (*)    All other components within normal limits        ECG: Ordered Personally reviewed and interpreted by me showing: HR : 78 Rhythm: Sinus rhythm Abnormal R-wave progression, early transition QTC 423  Lab Results  Component Value Date   SARSCOV2NAA NEGATIVE 07/16/2020    The recent clinical data is shown below. Vitals:   07/29/23 1800 07/29/23 1826 07/29/23 1830 07/29/23 1900  BP: (!) 118/98  133/82 139/89  Pulse: 87  84 89  Resp: 19  19 19   Temp:  97.9 F (36.6 C)    TempSrc:  Oral    SpO2: 98%  100% 99%       WBC     Component Value Date/Time   WBC 12.2 (H) 07/29/2023 1520   LYMPHSABS 1.9 07/29/2023 1520   MONOABS 0.6 07/29/2023 1520   EOSABS 0.1 07/29/2023 1520   BASOSABS 0.0 07/29/2023 1520    UA  ordered      __________________________________________________________ Recent Labs  Lab 07/29/23 1520  NA 137  K 4.0  CO2 20*  GLUCOSE 103*  BUN 25*  CREATININE 1.81*  CALCIUM 8.9    Cr  Up from baseline see below Lab Results  Component Value Date   CREATININE 1.81 (H) 07/29/2023   CREATININE 1.39 (H) 07/14/2020   CREATININE 1.21 08/02/2019    Recent Labs  Lab 07/29/23 1520  AST 17  ALT 13  ALKPHOS 46  BILITOT 0.7  PROT 6.5  ALBUMIN 3.6   Lab Results  Component Value Date   CALCIUM 8.9 07/29/2023        Plt: Lab Results  Component Value Date   PLT 224 07/29/2023    Recent Labs  Lab 07/29/23 1520  WBC 12.2*  NEUTROABS 9.5*  HGB 10.7*  HCT 32.9*  MCV 84.6  PLT 224    HG/HCT  Down   from baseline see below    Component Value Date/Time   HGB 10.7 (L) 07/29/2023 1520   HCT 32.9 (L) 07/29/2023 1520   MCV 84.6 07/29/2023 1520      _______________________________________________ Hospitalist was called for admission for   Closed fracture of left hip, initial encounter [facility])     The following Work up has been ordered so far:  Orders Placed This Encounter  Procedures   DG Hip Unilat With Pelvis 2-3 Views Left   DG Chest Portable 1 View   Comprehensive metabolic panel   CBC with Differential   NPO Except for Meds   Cardiac Monitoring Continuous x 24 hours Indications for use: Other; Other indications for use: aki   Consult to orthopedic surgery   Consult to internal medicine   Consult to hospitalist   ED EKG   EKG 12-Lead   Admit to Inpatient (patient's expected length of stay will be greater than 2 midnights or inpatient only procedure)     OTHER Significant initial  Findings:  labs showing:     DM  labs:  HbA1C: No results for input(s): "HGBA1C" in the last 8760 hours.     CBG (last 3)  No results for input(s): "GLUCAP" in the last 72 hours.        Cultures:    Component Value Date/Time   SDES URINE, RANDOM 10/19/2015 1859   SPECREQUEST ADDED 2212 10/19/2015 1859   CULT  10/19/2015 1859    >=100,000 COLONIES/mL KLEBSIELLA PNEUMONIAE >=100,000 COLONIES/mL ESCHERICHIA COLI    REPTSTATUS 10/23/2015 FINAL 10/19/2015 1859     Radiological Exams on Admission: DG Chest Portable 1 View  Result Date: 07/29/2023 CLINICAL DATA:  Tripped and fell. Found lying on the ground. Left hip pain. EXAM: PORTABLE CHEST 1 VIEW COMPARISON:  08/02/2019. FINDINGS: Cardiac silhouette is normal in size. No mediastinal or hilar masses. Lungs are mildly hyperexpanded. Partly calcified pleural plaques are noted over the lung bases. There is a smooth aligned at extends along the peripheral margin of the left lung from the apex to the lateral base. This is suspected to be a soft tissue shadow. Cannot completely exclude a pneumothorax. Lungs are clear. No pleural effusion. No evidence of a right pneumothorax. Skeletal  structures are grossly intact. IMPRESSION: 1. Curved line extending along the periphery of the left lung is suspected to be a soft tissue shadow. Cannot completely exclude a pneumothorax, however. Recommend repeat radiographs, with attempt to have the patient physician true AP. Current exam is rotated to the right. 2. No other evidence of acute cardiopulmonary disease. Electronically Signed   By: Amie [place] M.D.   On: 07/29/2023 15:56   DG Hip Unilat With Pelvis 2-3 Views Left  Result Date: 07/29/2023 CLINICAL DATA:  Fall, pain. EXAM: DG HIP (WITH OR WITHOUT PELVIS) 3V LEFT COMPARISON:  None Available. FINDINGS: Patient is status post bilateral total hip arthroplasty. Acute fracture identified of the left proximal femur laterally at the base of the greater trochanter displaced by couple of mm. Pelvic ring is intact. Lumbosacral degenerative changes. No osteolytic or osteoblastic changes. IMPRESSION: Acute fracture of the proximal left femur in the subtrochanteric region displaced by few mm. Otherwise unremarkable status post bilateral total hip arthroplasty. Electronically Signed   By: Layla Maw M.D.   On: 07/29/2023 15:22   _______________________________________________________________________________________________________ Latest  Blood pressure 139/89,  pulse 89, temperature 97.9 F (36.6 C), temperature source Oral, resp. rate 19, SpO2 99%.   Vitals  labs and radiology finding personally reviewed  Review of Systems:    Pertinent positives include: left hip pain  Constitutional:  No weight loss, night sweats, Fevers, chills, fatigue, weight loss  HEENT:  No headaches, Difficulty swallowing,Tooth/dental problems,Sore throat,  No sneezing, itching, ear ache, nasal congestion, post nasal drip,  Cardio-vascular:  No chest pain, Orthopnea, PND, anasarca, dizziness, palpitations.no Bilateral lower extremity swelling  GI:  No heartburn, indigestion, abdominal pain, nausea, vomiting,  diarrhea, change in bowel habits, loss of appetite, melena, blood in stool, hematemesis Resp:  no shortness of breath at rest. No dyspnea on exertion, No excess mucus, no productive cough, No non-productive cough, No coughing up of blood.No change in color of mucus.No wheezing. Skin:  no rash or lesions. No jaundice GU:  no dysuria, change in color of urine, no urgency or frequency. No straining to urinate.  No flank pain.  Musculoskeletal:  No joint pain or no joint swelling. No decreased range of motion. No back pain.  Psych:  No change in mood or affect. No depression or anxiety. No memory loss.  Neuro: no localizing neurological complaints, no tingling, no weakness, no double vision, no gait abnormality, no slurred speech, no confusion  All systems reviewed and apart from HOPI all are negative _______________________________________________________________________________________________ Past Medical History:   Past Medical History:  Diagnosis Date   Arthritis    back - steroid injection   Bladder stone    BPH (benign prostatic hypertrophy)    Frequency of urination    GERD (gastroesophageal reflux disease)    H/O asbestos exposure    Hearing loss 2020   no hearing aids   History of kidney stones    surgery to remove stones   Hyperlipidemia    Hypertension    Lung nodules    LAST CT 2012  PER DR WRIGHT NOTE NO FURTHER WORK-UP (PULMOLOGIST)   Type 2 diabetes mellitus (HCC)    Wears dentures    full upper   Wears glasses    Wears partial dentures    lower      Past Surgical History:  Procedure Laterality Date   CATARACT EXTRACTION W/ INTRAOCULAR LENS  IMPLANT, BILATERAL     CYSTOSCOPY WITH LITHOLAPAXY N/A 11/28/2013   Procedure: CYSTOSCOPY WITH stone retrieval;  Surgeon: Garnett Farm, MD;  Location: [facility];  Service: Urology;  Laterality: N/A;   CYSTOSCOPY WITH LITHOLAPAXY N/A 10/15/2015   Procedure: CYSTOSCOPY WITH LITHOLAPAXY, HOLMIUM LASER  LITHOTRIPSY tranurethral excision of prostate;  Surgeon: Ihor Gully, MD;  Location: [facility];  Service: Urology;  Laterality: N/A;   EXTRACORPOREAL SHOCK WAVE LITHOTRIPSY Right 05-13-2015   HOLMIUM LASER APPLICATION N/A 10/15/2015   Procedure: HOLMIUM LASER APPLICATION;  Surgeon: Ihor Gully, MD;  Location: [facility];  Service: Urology;  Laterality: N/A;   TOTAL HIP ARTHROPLASTY Bilateral 2000  &  2005   TRANSURETHRAL RESECTION OF PROSTATE  11/28/2013   Procedure: TRANSURETHRAL RESECTION OF THE PROSTATE WITH  BUTTON GYRUS INSTRUMENTS;  Surgeon: Garnett Farm, MD;  Location[facility];  Service: Urology;;   WRIST SURGERY Left 2005   INJURY   XI ROBOTIC ASSISTED INGUINAL HERNIA REPAIR WITH MESH Left 07/20/2020   Procedure: ROBOTIC LEFT INGUINAL HERNIA WITH MESH USING PROGRIP LAPAROSCOPIC SELF-FIXATING MESH 16cm x 12cm;  Surgeon: Axel Filler, MD;  Location: [facility] OR;  Service: General;  Laterality: Left;    Social History:  Ambulatory   independently       reports that he quit smoking about 27 years ago. His smoking use included cigarettes. He started smoking about 57 years ago. He has a 60 pack-year smoking history. He has never used smokeless tobacco. He reports that he does not drink alcohol and does not use drugs.    Family History:   Family History  Problem Relation Age of Onset   Cancer Mother        Unknown   Cancer Maternal Grandmother    Heart attack Paternal Grandfather    Coronary artery disease Father    Diabetes Father    ______________________________________________________________________________________________ Allergies: Allergies  Allergen Reactions   Prednisone Other (See Comments)    Doesn't like side effects of prednisone.      Prior to Admission medications   Medication Sig Start Date End Date Taking? Authorizing Provider  aspirin EC 81 MG tablet Take 81 mg by mouth daily. Swallow whole.   Yes  [provider]  finasteride (PROSCAR) 5 MG tablet Take 5 mg by mouth daily. 05/21/20  Yes [provider]  losartan (COZAAR) 25 MG tablet Take 50 mg by mouth daily.    Yes [provider]  metFORMIN (GLUCOPHAGE) 500 MG tablet Take 500 mg by mouth 2 (two) times daily with a meal.    Yes [provider]  tamsulosin (FLOMAX) 0.4 MG CAPS capsule Take 0.4 mg by mouth at bedtime. 09/14/15  Yes [provider]  COMIRNATY SUSP injection  08/11/22   [provider]  hydroxypropyl methylcellulose / hypromellose (ISOPTO TEARS / GONIOVISC) 2.5 % ophthalmic solution Place 1 drop into both eyes daily as needed for dry eyes. Patient not taking: Reported on 07/29/2023    [provider]  omeprazole (PRILOSEC) 40 MG capsule Take 40 mg by mouth daily before breakfast. Patient not taking: Reported on 07/29/2023 06/08/20   [provider]  [facility] VERIO test strip 1 each by Other route in the morning, at noon, and at bedtime.    [provider]  pantoprazole (PROTONIX) 40 MG tablet Take 40 mg by mouth every evening.  Patient not taking: Reported on 07/29/2023 06/18/20   [provider]    ___________________________________________________________________________________________________ Physical Exam:    07/29/2023    7:00 PM 07/29/2023    6:30 PM 07/29/2023    6:00 PM  Vitals with BMI  Systolic 139 133 161  Diastolic 89 82 98  Pulse 89 84 87     1. General:  in No v Acute distress   well -appearing 2. Psychological: Alert and   Oriented 3. Head/ENT:   Dry Mucous Membranes                          Head Non traumatic, neck supple                          Normal  Dentition 4. SKIN:  decreased Skin turgor,  Skin clean Dry and intact no rash    5. Heart: Regular rate and rhythm no Murmur, no Rub or gallop 6. Lungs: no wheezes or crackles   7. Abdomen: Soft, non-tender, Non distended  obese bowel sounds present 8. Lower  extremities: no clubbing, cyanosis, no edema 9. Neurologically Grossly intact, moving all 4 extremities equally  10. MSK: Normal range of motion  Limited in left leg  Chart has been reviewed  ______________________________________________________________________________________________  Assessment/Plan  82 y.o. male with medical history significant of DM2, CKD 3b, Spondylosis anemia    Admitted for   Closed fracture of left hip, initial encounter (HCC)    Present on Admission:  Closed left hip fracture (HCC)  Benign prostatic hyperplasia  Anemia  BPH (benign prostatic hyperplasia)  CKD (chronic kidney disease) stage 3, GFR 30-59 ml/min (HCC)     Closed left hip fracture (HCC)  - management as per orthopedics,  plan to operate   in  a.m.   Keep nothing by mouth post midnight. 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  Given advanced age patient is at least moderate  risk  but no furthther cardiac workup is indicated at this time     Benign prostatic hyperplasia Continue Proscar and Flomax when able to tolerate PO   Diabetes mellitus (HCC)  - Order Sensitive  SSI   -  check TSH and HgA1C  - Hold by mouth medications   Anemia Obtain anemia panel  Transfuse for Hg <7 , rapidly dropping or  if symptomatic   BPH (benign prostatic hyperplasia) Continue Flomax 0.4mg  po qday Proscar 5 mg po qday  CKD (chronic kidney disease) stage 3, GFR 30-59 ml/min (HCC)  -chronic avoid nephrotoxic medications such as NSAIDs, Vanco Zosyn combo,  avoid hypotension, continue to follow renal function   Other plan as per orders.  DVT prophylaxis:  SCD       Code Status:    Code Status: Prior FULL CODE  as per patient  family  I had personally discussed CODE STATUS with patient and family  ACP   none    Family Communication:   Family   at  Bedside  plan of care was discussed  with   Son in Social worker Daughter,   Diet dibeti diet and NPO post midnight   Disposition Plan:       To home once workup is complete and patient is stable   Following barriers for discharge:                            Hip fracture repaired                                                        Will need consultants to evaluate patient prior to discharge       Consult Orders  (From admission, onward)           Start     Ordered   07/29/23 1832  Consult to hospitalist  Once       Provider:  (Not yet assigned)  Question Answer Comment  Place call to: [facility]   Reason for Consult Admit      07/29/23 1831  Consults called: orthopedics are aware    Admission status:  ED Disposition     ED Disposition  Admit   Condition  --   Comment  Hospital Area: [facility] [[id number]]  Level of Care: Telemetry Medical [[id number]]  May admit patient to [facility] or [facility] if equivalent level of care is available:: No  Covid Evaluation: Asymptomatic - no recent exposure (last 10 days) testing not required  Diagnosis: Closed left hip fracture [facility]) [[id number]]  Admitting Physician: Therisa Doyne [[id number]]  Attending Physician: Therisa Doyne [[id number]]  Certification:: I certify this patient will need inpatient services for at least 2 midnights  Expected Medical Readiness: 08/01/2023           inpatient     I Expect 2 midnight stay secondary to severity of patient's current illness need for inpatient interventions justified by the following:    Severe lab/radiological/exam abnormalities including:    Left hip fracute and extensive comorbidities including:  DM2  CKD   That are currently affecting  medical management.   I expect  patient to be hospitalized for 2 midnights requiring inpatient medical care.  Patient is at high risk for adverse outcome (such as loss of life or disability) if not treated.  Indication for inpatient stay as follows:   severe pain requiring acute inpatient management,   Need for operative/procedural  intervention    Need for IV fluids, IV pain medications,      Level of care     tele  For 12H   Mose Colaizzi 07/29/2023, 7:54 PM    [facility]     after 2 AM please page floor coverage PA If 7AM-7PM, please contact the day team taking care of the patient using [email or web address]

## 2023-07-29 NOTE — Assessment & Plan Note (Signed)
-  chronic avoid nephrotoxic medications such as NSAIDs, Vanco Zosyn combo,  avoid hypotension, continue to follow renal function

## 2023-07-29 NOTE — ED Notes (Signed)
ED TO INPATIENT HANDOFF REPORT  ED Nurse Name and Phone #: Raquel Sarna [phone]  S Name/Age/Gender Kevin Suarez 82 y.o. male Room/Bed: RESUSC/RESUSC  Code Status   Code Status: Prior  Home/SNF/Other Home Patient oriented to: self, place, time, and situation Is this baseline? Yes   Triage Complete: Triage complete  Chief Complaint Closed left hip fracture (HCC) [S72.002A]  Triage Note Pt reports tripping over lumber that was laying on the ground. Pt c/o left hip pain. Denies head or neck pain.    Allergies Allergies  Allergen Reactions   Prednisone Other (See Comments)    Doesn't like side effects of prednisone.     Level of Care/Admitting Diagnosis ED Disposition     ED Disposition  Admit   Condition  --   Comment  Hospital Area: [facility] [[id number]]  Level of Care: Telemetry Medical [104]  May admit patient to [facility] or [facility] if equivalent level of care is available:: No  Covid Evaluation: Asymptomatic - no recent exposure (last 10 days) testing not required  Diagnosis: Closed left hip fracture [facility]) [[id number]]  Admitting Physician: Therisa Doyne [[id number]]  Attending Physician: Therisa Doyne [[id number]]  Certification:: I certify this patient will need inpatient services for at least 2 midnights  Expected Medical Readiness: 08/01/2023          B Medical/Surgery History Past Medical History:  Diagnosis Date   Arthritis    back - steroid injection   Bladder stone    BPH (benign prostatic hypertrophy)    Frequency of urination    GERD (gastroesophageal reflux disease)    H/O asbestos exposure    Hearing loss 2020   no hearing aids   History of kidney stones    surgery to remove stones   Hyperlipidemia    Hypertension    Lung nodules    LAST CT 2012  PER DR WRIGHT NOTE NO FURTHER WORK-UP (PULMOLOGIST)   Type 2 diabetes mellitus (HCC)    Wears dentures    full upper   Wears glasses    Wears partial dentures     lower   Past Surgical History:  Procedure Laterality Date   CATARACT EXTRACTION W/ INTRAOCULAR LENS  IMPLANT, BILATERAL     CYSTOSCOPY WITH LITHOLAPAXY N/A 11/28/2013   Procedure: CYSTOSCOPY WITH stone retrieval;  Surgeon: Garnett Farm, MD;  Location: [facility];  Service: Urology;  Laterality: N/A;   CYSTOSCOPY WITH LITHOLAPAXY N/A 10/15/2015   Procedure: CYSTOSCOPY WITH LITHOLAPAXY, HOLMIUM LASER LITHOTRIPSY tranurethral excision of prostate;  Surgeon: Ihor Gully, MD;  Location: [facility];  Service: Urology;  Laterality: N/A;   EXTRACORPOREAL SHOCK WAVE LITHOTRIPSY Right 05-13-2015   HOLMIUM LASER APPLICATION N/A 10/15/2015   Procedure: HOLMIUM LASER APPLICATION;  Surgeon: Ihor Gully, MD;  Location: [facility];  Service: Urology;  Laterality: N/A;   TOTAL HIP ARTHROPLASTY Bilateral 2000  &  2005   TRANSURETHRAL RESECTION OF PROSTATE  11/28/2013   Procedure: TRANSURETHRAL RESECTION OF THE PROSTATE WITH  BUTTON GYRUS INSTRUMENTS;  Surgeon: Garnett Farm, MD;  Location: [facility];  Service: Urology;;   WRIST SURGERY Left 2005   INJURY   XI ROBOTIC ASSISTED INGUINAL HERNIA REPAIR WITH MESH Left 07/20/2020   Procedure: ROBOTIC LEFT INGUINAL HERNIA WITH MESH USING PROGRIP LAPAROSCOPIC SELF-FIXATING MESH 16cm x 12cm;  Surgeon: Axel Filler, MD;  Location: [facility] OR;  Service: General;  Laterality: Left;     A IV Location/Drains/Wounds  Patient Lines/Drains/Airways Status     Active Line/Drains/Airways     Name Placement date Placement time Site Days   Peripheral IV 07/29/23 20 G Anterior;Left;Proximal Forearm 07/29/23  1530  Forearm  less than 1            Intake/Output Last 24 hours No intake or output data in the 24 hours ending 07/29/23 1925  Labs/Imaging Results for orders placed or performed during the hospital encounter of 07/29/23 (from the past 48 hour(s))  Comprehensive metabolic panel     Status: Abnormal    Collection Time: 07/29/23  3:20 PM  Result Value Ref Range   Sodium 137 135 - 145 mmol/L   Potassium 4.0 3.5 - 5.1 mmol/L   Chloride 107 98 - 111 mmol/L   CO2 20 (L) 22 - 32 mmol/L   Glucose, Bld 103 (H) 70 - 99 mg/dL    Comment: Glucose reference range applies only to samples taken after fasting for at least 8 hours.   BUN 25 (H) 8 - 23 mg/dL   Creatinine, Ser 5.36 (H) 0.61 - 1.24 mg/dL   Calcium 8.9 8.9 - 64.4 mg/dL   Total Protein 6.5 6.5 - 8.1 g/dL   Albumin 3.6 3.5 - 5.0 g/dL   AST 17 15 - 41 U/L   ALT 13 0 - 44 U/L   Alkaline Phosphatase 46 38 - 126 U/L   Total Bilirubin 0.7 0.3 - 1.2 mg/dL   GFR, Estimated 37 (L) >60 mL/min    Comment: (NOTE) Calculated using the CKD-EPI Creatinine Equation (2021)    Anion gap 10 5 - 15    Comment: Performed at [facility], [address], [place], [place] [address]  CBC with Differential     Status: Abnormal   Collection Time: 07/29/23  3:20 PM  Result Value Ref Range   WBC 12.2 (H) 4.0 - 10.5 K/uL   RBC 3.89 (L) 4.22 - 5.81 MIL/uL   Hemoglobin 10.7 (L) 13.0 - 17.0 g/dL   HCT 25.9 (L) 56.3 - 87.5 %   MCV 84.6 80.0 - 100.0 fL   MCH 27.5 26.0 - 34.0 pg   MCHC 32.5 30.0 - 36.0 g/dL   RDW 64.3 32.9 - 51.8 %   Platelets 224 150 - 400 K/uL   nRBC 0.0 0.0 - 0.2 %   Neutrophils Relative % 78 %   Neutro Abs 9.5 (H) 1.7 - 7.7 K/uL   Lymphocytes Relative 15 %   Lymphs Abs 1.9 0.7 - 4.0 K/uL   Monocytes Relative 5 %   Monocytes Absolute 0.6 0.1 - 1.0 K/uL   Eosinophils Relative 1 %   Eosinophils Absolute 0.1 0.0 - 0.5 K/uL   Basophils Relative 0 %   Basophils Absolute 0.0 0.0 - 0.1 K/uL   Immature Granulocytes 1 %   Abs Immature Granulocytes 0.08 (H) 0.00 - 0.07 K/uL    Comment: Performed at [facility], [address], [place], [place] [address]   DG Chest Portable 1 View  Result Date: 07/29/2023 CLINICAL DATA:  Tripped and fell. Found lying on the ground. Left hip pain. EXAM: PORTABLE CHEST 1 VIEW COMPARISON:   08/02/2019. FINDINGS: Cardiac silhouette is normal in size. No mediastinal or hilar masses. Lungs are mildly hyperexpanded. Partly calcified pleural plaques are noted over the lung bases. There is a smooth aligned at extends along the peripheral margin of the left lung from the apex to the lateral base. This is suspected to be a  soft tissue shadow. Cannot completely exclude a pneumothorax. Lungs are clear. No pleural effusion. No evidence of a right pneumothorax. Skeletal structures are grossly intact. IMPRESSION: 1. Curved line extending along the periphery of the left lung is suspected to be a soft tissue shadow. Cannot completely exclude a pneumothorax, however. Recommend repeat radiographs, with attempt to have the patient physician true AP. Current exam is rotated to the right. 2. No other evidence of acute cardiopulmonary disease. Electronically Signed   By: Amie Portland M.D.   On: 07/29/2023 15:56   DG Hip Unilat With Pelvis 2-3 Views Left  Result Date: 07/29/2023 CLINICAL DATA:  Fall, pain. EXAM: DG HIP (WITH OR WITHOUT PELVIS) 3V LEFT COMPARISON:  None Available. FINDINGS: Patient is status post bilateral total hip arthroplasty. Acute fracture identified of the left proximal femur laterally at the base of the greater trochanter displaced by couple of mm. Pelvic ring is intact. Lumbosacral degenerative changes. No osteolytic or osteoblastic changes. IMPRESSION: Acute fracture of the proximal left femur in the subtrochanteric region displaced by few mm. Otherwise unremarkable status post bilateral total hip arthroplasty. Electronically Signed   By: Layla Maw M.D.   On: 07/29/2023 15:22    Pending Labs Unresulted Labs (From admission, onward)     Start     Ordered   07/30/23 0500  CBC  Tomorrow morning,   R        07/29/23 1921   07/30/23 0500  Type and screen [facility]  Once,   R       Comments: [facility]    07/29/23 1921   07/29/23 1926   Urinalysis, Complete w Microscopic -Urine, Clean Catch  Once,   R       Question Answer Comment  Release to patient Immediate   Specimen Source Urine, Clean Catch      07/29/23 1925            Vitals/Pain Today's Vitals   07/29/23 1800 07/29/23 1826 07/29/23 1830 07/29/23 1900  BP: (!) 118/98  133/82 139/89  Pulse: 87  84 89  Resp: 19  19 19   Temp:  97.9 F (36.6 C)    TempSrc:  Oral    SpO2: 98%  100% 99%  PainSc:        Isolation Precautions No active isolations  Medications Medications  morphine (PF) 4 MG/ML injection 4 mg (4 mg Intravenous Given 07/29/23 1540)  ondansetron (ZOFRAN) injection 4 mg (4 mg Intravenous Given 07/29/23 1538)    Mobility non-ambulatory     Focused Assessments Pt with left hip pain post fall   R Recommendations: See Admitting Provider Note  Report given to:   Additional Notes: Pt a/o x 4 unable to ambulate

## 2023-07-29 NOTE — Progress Notes (Signed)
Orthopedic Tech Progress Note Patient Details:  [name] 1941/03/12 [id number]  Patient ID: Kevin Suarez, male   DOB: 20-Dec-1940, 82 y.o.   MRN: [id number] Patient doesn't meet criteria for ohf. Patient must be under 70 to get ohf. Trinna Post 07/29/2023, 11:49 PM

## 2023-07-29 NOTE — Subjective & Objective (Addendum)
He tripped today did not hit head no LOC Severe left hip pain right away It happened in the garage Was outside for about 1 hour before he was found Denies any CP or SOB  He is on Aspirin 81 mg po q day just because No hx of CAD, stroke,   no COPD no CHF Before the fall he could walk up a flight of stairs could walk to mail box But has some limitation bc of back pain but not CP or SOB has  CKD stage 3a

## 2023-07-29 NOTE — Progress Notes (Signed)
Patient brought to PACU from 5N30, alert oriented x4, for a left hip block by Dr. Jean Rosenthal, MD. Pt consents signed, vitals obtained, meds given for nausea and pain, time out performed, vitals taken throughout procedure. Patient tolerated procedure well, pain relief after finished with block. Dr. Jean Rosenthal remained at bedside throughout

## 2023-07-29 NOTE — ED Triage Notes (Signed)
Pt reports tripping over lumber that was laying on the ground. Pt c/o left hip pain. Denies head or neck pain.

## 2023-07-29 NOTE — ED Provider Notes (Signed)
[place] EMERGENCY DEPARTMENT AT [facility] Provider Note   CSN: [id number] Arrival date & time: 07/29/23  1433     History  Chief Complaint  Patient presents with   Hip Pain    Kevin Suarez is a 82 y.o. male.  This is a 82 year old male who presents emergency department after he tripped over some lumber that was on the ground and he landed on his left hip.  He has bilateral hip prosthetics, performed by Dr. Eulah Pont 30 years ago.  Denies any head strike.  Not any blood thinners.  Patient has a past medical history of diabetes on metformin.   Hip Pain       Home Medications Prior to Admission medications   Medication Sig Start Date End Date Taking? Authorizing Provider  aspirin EC 81 MG tablet Take 81 mg by mouth daily. Swallow whole.   Yes [provider]  finasteride (PROSCAR) 5 MG tablet Take 5 mg by mouth daily. 05/21/20  Yes [provider]  losartan (COZAAR) 25 MG tablet Take 50 mg by mouth daily.    Yes [provider]  metFORMIN (GLUCOPHAGE) 500 MG tablet Take 500 mg by mouth 2 (two) times daily with a meal.    Yes [provider]  tamsulosin (FLOMAX) 0.4 MG CAPS capsule Take 0.4 mg by mouth at bedtime. 09/14/15  Yes [provider]  COMIRNATY SUSP injection  08/11/22   [provider]  hydroxypropyl methylcellulose / hypromellose (ISOPTO TEARS / GONIOVISC) 2.5 % ophthalmic solution Place 1 drop into both eyes daily as needed for dry eyes. Patient not taking: Reported on 07/29/2023    [provider]  omeprazole (PRILOSEC) 40 MG capsule Take 40 mg by mouth daily before breakfast. Patient not taking: Reported on 07/29/2023 06/08/20   [provider]  [facility] VERIO test strip 1 each by Other route in the morning, at noon, and at bedtime.    [provider]  pantoprazole (PROTONIX) 40 MG tablet Take 40 mg by mouth every evening.  Patient not taking: Reported on 07/29/2023 06/18/20    [provider]      Allergies    Prednisone    Review of Systems   Review of Systems  Physical Exam Updated Vital Signs BP 133/82   Pulse 84   Temp 97.9 F (36.6 C) (Oral)   Resp 19   SpO2 100%  Physical Exam Vitals reviewed.  HENT:     Head: Normocephalic and atraumatic.  Eyes:     Extraocular Movements: Extraocular movements intact.  Cardiovascular:     Rate and Rhythm: Normal rate.  Pulmonary:     Effort: No respiratory distress.  Musculoskeletal:     Cervical back: Neck supple. No tenderness.     Comments: Tenderness to palpation over the left greater trochanter  Neurological:     General: No focal deficit present.     Mental Status: He is alert and oriented to person, place, and time.     ED Results / Procedures / Treatments   Labs (all labs ordered are listed, but only abnormal results are displayed) Labs Reviewed  COMPREHENSIVE METABOLIC PANEL - Abnormal; Notable for the following components:      Result Value   CO2 20 (*)    Glucose, Bld 103 (*)    BUN 25 (*)    Creatinine, Ser 1.81 (*)    GFR, Estimated 37 (*)    All other components within normal limits  CBC WITH  DIFFERENTIAL/PLATELET - Abnormal; Notable for the following components:   WBC 12.2 (*)    RBC 3.89 (*)    Hemoglobin 10.7 (*)    HCT 32.9 (*)    Neutro Abs 9.5 (*)    Abs Immature Granulocytes 0.08 (*)    All other components within normal limits    EKG EKG Interpretation Date/Time:  Sunday July 29 2023 15:29:55 EDT Ventricular Rate:  78 PR Interval:  169 QRS Duration:  98 QT Interval:  371 QTC Calculation: 423 R Axis:   33  Text Interpretation: Sinus rhythm Abnormal R-wave progression, early transition Confirmed by Anders Simmonds [id number]) on 07/29/2023 3:53:09 PM  Radiology DG Chest Portable 1 View  Result Date: 07/29/2023 CLINICAL DATA:  Tripped and fell. Found lying on the ground. Left hip pain. EXAM: PORTABLE CHEST 1 VIEW COMPARISON:  08/02/2019. FINDINGS:  Cardiac silhouette is normal in size. No mediastinal or hilar masses. Lungs are mildly hyperexpanded. Partly calcified pleural plaques are noted over the lung bases. There is a smooth aligned at extends along the peripheral margin of the left lung from the apex to the lateral base. This is suspected to be a soft tissue shadow. Cannot completely exclude a pneumothorax. Lungs are clear. No pleural effusion. No evidence of a right pneumothorax. Skeletal structures are grossly intact. IMPRESSION: 1. Curved line extending along the periphery of the left lung is suspected to be a soft tissue shadow. Cannot completely exclude a pneumothorax, however. Recommend repeat radiographs, with attempt to have the patient physician true AP. Current exam is rotated to the right. 2. No other evidence of acute cardiopulmonary disease. Electronically Signed   By: Amie Portland M.D.   On: 07/29/2023 15:56   DG Hip Unilat With Pelvis 2-3 Views Left  Result Date: 07/29/2023 CLINICAL DATA:  Fall, pain. EXAM: DG HIP (WITH OR WITHOUT PELVIS) 3V LEFT COMPARISON:  None Available. FINDINGS: Patient is status post bilateral total hip arthroplasty. Acute fracture identified of the left proximal femur laterally at the base of the greater trochanter displaced by couple of mm. Pelvic ring is intact. Lumbosacral degenerative changes. No osteolytic or osteoblastic changes. IMPRESSION: Acute fracture of the proximal left femur in the subtrochanteric region displaced by few mm. Otherwise unremarkable status post bilateral total hip arthroplasty. Electronically Signed   By: Layla Maw M.D.   On: 07/29/2023 15:22    Procedures Procedures    Medications Ordered in ED Medications  morphine (PF) 4 MG/ML injection 4 mg (4 mg Intravenous Given 07/29/23 1540)  ondansetron (ZOFRAN) injection 4 mg (4 mg Intravenous Given 07/29/23 1538)    ED Course/ Medical Decision Making/ A&P                                 Medical Decision Making This  is an 82 year old male here today after a nonsyncopal fall from standing, landing on his left hip.  Differential diagnoses include left hip fracture, less likely intracranial hemorrhage, less likely blunt thoracic or cardiac injury.  Patient alert and oriented, no signs of trauma to the head, neck, chest, upper extremities, abdomen.  Does have some tenderness in his left hip.  Plain films do show a left hip fracture.  My dependent review the patient's EKG shows no ST segment depressions or elevations, no evidence of acute ischemia.    Consult placed to orthopedic surgery.  Reassessment-spoke with a Dr. Carola Frost regarding the patient.  He recommends medical admission.  Patient admitted to hospitalist.  Amount and/or Complexity of Data Reviewed Labs: ordered. Radiology: ordered.  Risk Prescription drug management. Decision regarding hospitalization.           Final Clinical Impression(s) / ED Diagnoses Final diagnoses:  Closed fracture of left hip, initial encounter [facility])    Rx / DC Orders ED Discharge Orders     None         Arletha Pili, DO 07/29/23 1848

## 2023-07-29 NOTE — Anesthesia Preprocedure Evaluation (Addendum)
Anesthesia Evaluation  Patient identified by MRN, date of birth, ID band Patient awake    Reviewed: Allergy & Precautions, NPO status , Patient's Chart, lab work & pertinent test results  History of Anesthesia Complications Negative for: history of anesthetic complications  Airway Mallampati: I  TM Distance: >3 FB Neck ROM: Full    Dental  (+) Lower Dentures, Upper Dentures   Pulmonary former smoker   breath sounds clear to auscultation       Cardiovascular hypertension, Pt. on medications (-) angina  Rhythm:Regular Rate:Tachycardia     Neuro/Psych negative neurological ROS     GI/Hepatic Neg liver ROS,GERD  Medicated and Controlled,,  Endo/Other  diabetes, Oral Hypoglycemic Agents    Renal/GU Renal InsufficiencyRenal disease     Musculoskeletal  (+) Arthritis  (s/p bilat hip replacements),  L subtrochanteric hip fracture   Abdominal   Peds  Hematology  (+) Blood dyscrasia (Hb 10.7), anemia   Anesthesia Other Findings   Reproductive/Obstetrics                              Anesthesia Physical Anesthesia Plan  ASA: 3  Anesthesia Plan: Regional   Post-op Pain Management: Regional block*   Induction:   PONV Risk Score and Plan: 1  Airway Management Planned: Natural Airway and Nasal Cannula  Additional Equipment: None  Intra-op Plan:   Post-operative Plan:   Informed Consent: I have reviewed the patients History and Physical, chart, labs and discussed the procedure including the risks, benefits and alternatives for the proposed anesthesia with the patient or authorized representative who has indicated his/her understanding and acceptance.       Plan Discussed with:   Anesthesia Plan Comments: (Plan femoral nerve block for analgesia, for OR tomorrow)         Anesthesia Quick Evaluation

## 2023-07-29 NOTE — Assessment & Plan Note (Signed)
-   Order Sensitive  SSI     -  check TSH and HgA1C  - Hold by mouth medications*  

## 2023-07-29 NOTE — Consult Note (Deleted)
Brief Consult Note  Full consult note to follow. Information gathered from chart review  82 year old male with mechanical fall today, tripped over lumber laying on ground. Complaining of left hip pain.  Imaging of Pelvis and hip demonstrates a left periprosthetic femur fracture with possible subsidence/loosening of THA stem.  Recommend following: Admission to medical service with optimization/risk stratification for OR AP and lateral left femur CT pelvis/left hip

## 2023-07-29 NOTE — Progress Notes (Signed)
Orthopaedic Trauma Service Progress Note  Ortho aware of patient We are on call for [facility].  Pt had B THAs performed by Dr. Mckinley Jewel 20-24  years ago  Will obtain CT of L hip w/o contrast to fully characterize fracture pattern to determine most appropriate course of treatment.  Also check knee film for completeness of evaluating joint above and below zone of injury   Continue with plan as previously discussed with EDP   May eat now if hungry   NPO after MN in the event pattern is surgical   Mearl Latin, PA-C [phone] (C) 07/29/2023, 7:29 PM  [facility] [address] [place] [address] [phone] Collier Bullock (F)      Patient ID: Kevin Suarez, male   DOB: August 20, 1941, 82 y.o.   MRN: [id number]

## 2023-07-30 ENCOUNTER — Encounter (HOSPITAL_COMMUNITY)
Admission: EM | Disposition: A | Discharge status: Home Health | Payer: Self-pay | Source: Home / Self Care | Attending: Internal Medicine

## 2023-07-30 ENCOUNTER — Inpatient Hospital Stay (HOSPITAL_COMMUNITY): Payer: Medicare HMO

## 2023-07-30 ENCOUNTER — Encounter (HOSPITAL_COMMUNITY): Payer: Self-pay | Admitting: Anesthesiology

## 2023-07-30 DIAGNOSIS — S72002A Fracture of unspecified part of neck of left femur, initial encounter for closed fracture: Secondary | ICD-10-CM | POA: Diagnosis not present

## 2023-07-30 LAB — CBC
HCT: 28.3 % — ABNORMAL LOW (ref 39.0–52.0)
Hemoglobin: 9.3 g/dL — ABNORMAL LOW (ref 13.0–17.0)
MCH: 27.6 pg (ref 26.0–34.0)
MCHC: 32.9 g/dL (ref 30.0–36.0)
MCV: 84 fL (ref 80.0–100.0)
Platelets: 197 10*3/uL (ref 150–400)
RBC: 3.37 MIL/uL — ABNORMAL LOW (ref 4.22–5.81)
RDW: 13.4 % (ref 11.5–15.5)
WBC: 8.6 10*3/uL (ref 4.0–10.5)
nRBC: 0 % (ref 0.0–0.2)

## 2023-07-30 LAB — TSH: TSH: 1.838 u[IU]/mL (ref 0.350–4.500)

## 2023-07-30 LAB — URINALYSIS, COMPLETE (UACMP) WITH MICROSCOPIC
Bilirubin Urine: NEGATIVE
Glucose, UA: NEGATIVE mg/dL
Hgb urine dipstick: NEGATIVE
Ketones, ur: NEGATIVE mg/dL
Leukocytes,Ua: NEGATIVE
Nitrite: NEGATIVE
Protein, ur: NEGATIVE mg/dL
Specific Gravity, Urine: 1.025 (ref 1.005–1.030)
pH: 5.5 (ref 5.0–8.0)

## 2023-07-30 LAB — COMPREHENSIVE METABOLIC PANEL
ALT: 12 U/L (ref 0–44)
AST: 15 U/L (ref 15–41)
Albumin: 3 g/dL — ABNORMAL LOW (ref 3.5–5.0)
Alkaline Phosphatase: 49 U/L (ref 38–126)
Anion gap: 8 (ref 5–15)
BUN: 24 mg/dL — ABNORMAL HIGH (ref 8–23)
CO2: 23 mmol/L (ref 22–32)
Calcium: 8.5 mg/dL — ABNORMAL LOW (ref 8.9–10.3)
Chloride: 106 mmol/L (ref 98–111)
Creatinine, Ser: 1.62 mg/dL — ABNORMAL HIGH (ref 0.61–1.24)
GFR, Estimated: 42 mL/min — ABNORMAL LOW (ref >60)
Glucose, Bld: 113 mg/dL — ABNORMAL HIGH (ref 70–99)
Potassium: 4.2 mmol/L (ref 3.5–5.1)
Sodium: 137 mmol/L (ref 135–145)
Total Bilirubin: 0.7 mg/dL (ref 0.3–1.2)
Total Protein: 5.6 g/dL — ABNORMAL LOW (ref 6.5–8.1)

## 2023-07-30 LAB — MAGNESIUM: Magnesium: 1.6 mg/dL — ABNORMAL LOW (ref 1.7–2.4)

## 2023-07-30 LAB — PREALBUMIN: Prealbumin: 15 mg/dL — ABNORMAL LOW (ref 18–38)

## 2023-07-30 LAB — CK: Total CK: 39 U/L — ABNORMAL LOW (ref 49–397)

## 2023-07-30 LAB — RETICULOCYTES
Immature Retic Fract: 12.8 % (ref 2.3–15.9)
RBC.: 3.3 MIL/uL — ABNORMAL LOW (ref 4.22–5.81)
Retic Count, Absolute: 44.6 10*3/uL (ref 19.0–186.0)
Retic Ct Pct: 1.4 % (ref 0.4–3.1)

## 2023-07-30 LAB — IRON AND TIBC
Iron: 37 ug/dL — ABNORMAL LOW (ref 45–182)
Saturation Ratios: 14 % — ABNORMAL LOW (ref 17.9–39.5)
TIBC: 260 ug/dL (ref 250–450)
UIBC: 223 ug/dL

## 2023-07-30 LAB — GLUCOSE, CAPILLARY
Glucose-Capillary: 103 mg/dL — ABNORMAL HIGH (ref 70–99)
Glucose-Capillary: 106 mg/dL — ABNORMAL HIGH (ref 70–99)
Glucose-Capillary: 85 mg/dL (ref 70–99)
Glucose-Capillary: 162 mg/dL — ABNORMAL HIGH (ref 70–99)
Glucose-Capillary: 133 mg/dL — ABNORMAL HIGH (ref 70–99)

## 2023-07-30 LAB — FERRITIN: Ferritin: 37 ng/mL (ref 24–336)

## 2023-07-30 LAB — FOLATE: Folate: 15.1 ng/mL (ref >5.9)

## 2023-07-30 LAB — PHOSPHORUS: Phosphorus: 3.6 mg/dL (ref 2.5–4.6)

## 2023-07-30 LAB — TYPE AND SCREEN
ABO/RH(D): A POS
Antibody Screen: NEGATIVE

## 2023-07-30 LAB — VITAMIN B12: Vitamin B-12: 452 pg/mL (ref 180–914)

## 2023-07-30 LAB — SODIUM, URINE, RANDOM: Sodium, Ur: 96 mmol/L

## 2023-07-30 LAB — CREATININE, URINE, RANDOM: Creatinine, Urine: 164 mg/dL

## 2023-07-30 LAB — SURGICAL PCR SCREEN
MRSA, PCR: NEGATIVE
Staphylococcus aureus: POSITIVE — AB

## 2023-07-30 LAB — OSMOLALITY, URINE: Osmolality, Ur: 593 mOsm/kg (ref 300–900)

## 2023-07-30 LAB — VITAMIN D 25 HYDROXY (VIT D DEFICIENCY, FRACTURES): Vit D, 25-Hydroxy: 38.14 ng/mL (ref 30–100)

## 2023-07-30 LAB — OSMOLALITY: Osmolality: 296 mosm/kg — ABNORMAL HIGH (ref 275–295)

## 2023-07-30 SURGERY — OPEN REDUCTION INTERNAL FIXATION (ORIF) DISTAL FEMUR FRACTURE
Anesthesia: General | Laterality: Left

## 2023-07-30 MED ORDER — FERROUS SULFATE 325 (65 FE) MG PO TABS
325.0000 mg | ORAL_TABLET | Freq: Every day | ORAL | Status: DC
  Administered 2023-07-31 – 2023-08-03 (×4): 325 mg via ORAL
  Filled 2023-07-30 (×4): qty 1

## 2023-07-30 MED ORDER — RENA-VITE PO TABS
1.0000 | ORAL_TABLET | Freq: Every day | ORAL | Status: DC
  Administered 2023-07-30 – 2023-08-02 (×4): 1 via ORAL
  Filled 2023-07-30 (×4): qty 1

## 2023-07-30 MED ORDER — CHLORHEXIDINE GLUCONATE CLOTH 2 % EX PADS
6.0000 | MEDICATED_PAD | Freq: Every day | CUTANEOUS | Status: DC
  Administered 2023-07-30 – 2023-08-03 (×4): 6 via TOPICAL

## 2023-07-30 MED ORDER — SODIUM CHLORIDE 0.9 % IV SOLN: INTRAVENOUS | Status: DC

## 2023-07-30 MED ORDER — ENOXAPARIN SODIUM 40 MG/0.4ML IJ SOSY
40.0000 mg | PREFILLED_SYRINGE | INTRAMUSCULAR | Status: DC
  Administered 2023-07-30 – 2023-08-02 (×4): 40 mg via SUBCUTANEOUS
  Filled 2023-07-30 (×4): qty 0.4

## 2023-07-30 MED ORDER — MAGNESIUM SULFATE 2 GM/50ML IV SOLN
2.0000 g | Freq: Once | INTRAVENOUS | Status: AC
  Administered 2023-07-30: 2 g via INTRAVENOUS
  Filled 2023-07-30: qty 50

## 2023-07-30 MED ORDER — MUPIROCIN 2 % EX OINT
1.0000 | TOPICAL_OINTMENT | Freq: Two times a day (BID) | CUTANEOUS | Status: DC
  Administered 2023-07-30 – 2023-08-03 (×9): 1 via NASAL
  Filled 2023-07-30 (×5): qty 22

## 2023-07-30 MED ORDER — ENSURE ENLIVE PO LIQD
237.0000 mL | ORAL | Status: DC
  Administered 2023-07-30 – 2023-08-02 (×4): 237 mL via ORAL

## 2023-07-30 NOTE — Consult Note (Signed)
Reason for Consult:Left hip fx Referring Physician: Andreas Newport Time called: [id number] Time at bedside: 0857   Kevin Suarez is an 82 y.o. male.  HPI: Kevin Suarez tripped over some timber on his garage floor and fell onto his left hip. He had immediate left hip pain and could not get up. He was brought to the ED where workup showed a left periprosthetic hip fx and orthopedic surgery was consulted. He lives at home alone and does not use any assistive devices to ambulate.  Past Medical History:  Diagnosis Date   Arthritis    back - steroid injection   Bladder stone    BPH (benign prostatic hypertrophy)    Frequency of urination    GERD (gastroesophageal reflux disease)    H/O asbestos exposure    Hearing loss 2020   no hearing aids   History of kidney stones    surgery to remove stones   Hyperlipidemia    Hypertension    Lung nodules    LAST CT 2012  PER DR WRIGHT NOTE NO FURTHER WORK-UP (PULMOLOGIST)   Type 2 diabetes mellitus (HCC)    Wears dentures    full upper   Wears glasses    Wears partial dentures    lower    Past Surgical History:  Procedure Laterality Date   CATARACT EXTRACTION W/ INTRAOCULAR LENS  IMPLANT, BILATERAL     CYSTOSCOPY WITH LITHOLAPAXY N/A 11/28/2013   Procedure: CYSTOSCOPY WITH stone retrieval;  Surgeon: Garnett Farm, MD;  Location: [facility];  Service: Urology;  Laterality: N/A;   CYSTOSCOPY WITH LITHOLAPAXY N/A 10/15/2015   Procedure: CYSTOSCOPY WITH LITHOLAPAXY, HOLMIUM LASER LITHOTRIPSY tranurethral excision of prostate;  Surgeon: Ihor Gully, MD;  Location[facility];  Service: Urology;  Laterality: N/A;   EXTRACORPOREAL SHOCK WAVE LITHOTRIPSY Right 05-13-2015   HOLMIUM LASER APPLICATION N/A 10/15/2015   Procedure: HOLMIUM LASER APPLICATION;  Surgeon: Ihor Gully, MD;  Location: [facility];  Service: Urology;  Laterality: N/A;   TOTAL HIP ARTHROPLASTY Bilateral 2000  &  2005   TRANSURETHRAL  RESECTION OF PROSTATE  11/28/2013   Procedure: TRANSURETHRAL RESECTION OF THE PROSTATE WITH  BUTTON GYRUS INSTRUMENTS;  Surgeon: Garnett Farm, MD;  Location: [facility];  Service: Urology;;   WRIST SURGERY Left 2005   INJURY   XI ROBOTIC ASSISTED INGUINAL HERNIA REPAIR WITH MESH Left 07/20/2020   Procedure: ROBOTIC LEFT INGUINAL HERNIA WITH MESH USING PROGRIP LAPAROSCOPIC SELF-FIXATING MESH 16cm x 12cm;  Surgeon: Axel Filler, MD;  Location: [facility] OR;  Service: General;  Laterality: Left;    Family History  Problem Relation Age of Onset   Cancer Mother        Unknown   Cancer Maternal Grandmother    Heart attack Paternal Grandfather    Coronary artery disease Father    Diabetes Father     Social History:  reports that he quit smoking about 27 years ago. His smoking use included cigarettes. He started smoking about 57 years ago. He has a 60 pack-year smoking history. He has never used smokeless tobacco. He reports that he does not drink alcohol and does not use drugs.  Allergies:  Allergies  Allergen Reactions   Prednisone Other (See Comments)    Doesn't like side effects of prednisone.     Medications: I have reviewed the patient's current medications.  Results for orders placed or performed during the hospital encounter of 07/29/23 (from the past 48 hour(s))  Comprehensive metabolic panel     Status: Abnormal   Collection Time: 07/29/23  3:20 PM  Result Value Ref Range   Sodium 137 135 - 145 mmol/L   Potassium 4.0 3.5 - 5.1 mmol/L   Chloride 107 98 - 111 mmol/L   CO2 20 (L) 22 - 32 mmol/L   Glucose, Bld 103 (H) 70 - 99 mg/dL    Comment: Glucose reference range applies only to samples taken after fasting for at least 8 hours.   BUN 25 (H) 8 - 23 mg/dL   Creatinine, Ser 6.04 (H) 0.61 - 1.24 mg/dL   Calcium 8.9 8.9 - 54.0 mg/dL   Total Protein 6.5 6.5 - 8.1 g/dL   Albumin 3.6 3.5 - 5.0 g/dL   AST 17 15 - 41 U/L   ALT 13 0 - 44 U/L   Alkaline Phosphatase 46  38 - 126 U/L   Total Bilirubin 0.7 0.3 - 1.2 mg/dL   GFR, Estimated 37 (L) >60 mL/min    Comment: (NOTE) Calculated using the CKD-EPI Creatinine Equation (2021)    Anion gap 10 5 - 15    Comment: Performed at [facility], [address] [place], [place] [address]  CBC with Differential     Status: Abnormal   Collection Time: 07/29/23  3:20 PM  Result Value Ref Range   WBC 12.2 (H) 4.0 - 10.5 K/uL   RBC 3.89 (L) 4.22 - 5.81 MIL/uL   Hemoglobin 10.7 (L) 13.0 - 17.0 g/dL   HCT 14.7 (L) 82.9 - 56.2 %   MCV 84.6 80.0 - 100.0 fL   MCH 27.5 26.0 - 34.0 pg   MCHC 32.5 30.0 - 36.0 g/dL   RDW 13.0 86.5 - 78.4 %   Platelets 224 150 - 400 K/uL   nRBC 0.0 0.0 - 0.2 %   Neutrophils Relative % 78 %   Neutro Abs 9.5 (H) 1.7 - 7.7 K/uL   Lymphocytes Relative 15 %   Lymphs Abs 1.9 0.7 - 4.0 K/uL   Monocytes Relative 5 %   Monocytes Absolute 0.6 0.1 - 1.0 K/uL   Eosinophils Relative 1 %   Eosinophils Absolute 0.1 0.0 - 0.5 K/uL   Basophils Relative 0 %   Basophils Absolute 0.0 0.0 - 0.1 K/uL   Immature Granulocytes 1 %   Abs Immature Granulocytes 0.08 (H) 0.00 - 0.07 K/uL    Comment: Performed at [facility], [address] [place], [place] [address]  ABO/Rh     Status: None   Collection Time: 07/29/23  3:20 PM  Result Value Ref Range   ABO/RH(D)      A POS Performed at [facility], [address] [place], [place] [address]   CBG monitoring, ED     Status: None   Collection Time: 07/29/23  7:51 PM  Result Value Ref Range   Glucose-Capillary 82 70 - 99 mg/dL    Comment: Glucose reference range applies only to samples taken after fasting for at least 8 hours.  Glucose, capillary     Status: None   Collection Time: 07/29/23  8:29 PM  Result Value Ref Range   Glucose-Capillary 93 70 - 99 mg/dL    Comment: Glucose reference range applies only to samples taken after fasting for at least 8 hours.  Surgical pcr screen     Status: Abnormal   Collection Time: 07/29/23  11:21 PM   Specimen: Nasal Mucosa; Nasal Swab  Result Value Ref  Range   MRSA, PCR NEGATIVE NEGATIVE   Staphylococcus aureus POSITIVE (A) NEGATIVE    Comment: (NOTE) The Xpert SA Assay (FDA approved for NASAL specimens in patients 1 years of age and older), is one component of a comprehensive surveillance program. It is not intended to diagnose infection nor to guide or monitor treatment. Performed at [facility], [address] [place], [place] [address]   Glucose, capillary     Status: Abnormal   Collection Time: 07/29/23 11:45 PM  Result Value Ref Range   Glucose-Capillary 107 (H) 70 - 99 mg/dL    Comment: Glucose reference range applies only to samples taken after fasting for at least 8 hours.  CBC     Status: Abnormal   Collection Time: 07/30/23  3:55 AM  Result Value Ref Range   WBC 8.6 4.0 - 10.5 K/uL   RBC 3.37 (L) 4.22 - 5.81 MIL/uL   Hemoglobin 9.3 (L) 13.0 - 17.0 g/dL   HCT 62.9 (L) 52.8 - 41.3 %   MCV 84.0 80.0 - 100.0 fL   MCH 27.6 26.0 - 34.0 pg   MCHC 32.9 30.0 - 36.0 g/dL   RDW 24.4 01.0 - 27.2 %   Platelets 197 150 - 400 K/uL   nRBC 0.0 0.0 - 0.2 %    Comment: Performed at [facility], [address], [place], [place] [address]  Type and screen [facility]     Status: None   Collection Time: 07/30/23  3:55 AM  Result Value Ref Range   ABO/RH(D) A POS    Antibody Screen NEG    Sample Expiration      08/02/2023,2359 Performed at [facility], [address], [place], [place] [address]   Vitamin B12     Status: None   Collection Time: 07/30/23  3:55 AM  Result Value Ref Range   Vitamin B-12 452 180 - 914 pg/mL    Comment: (NOTE) This assay is not validated for testing neonatal or myeloproliferative syndrome specimens for Vitamin B12 levels. Performed at [facility], [address] [place], [place] [address]   Folate     Status: None   Collection Time: 07/30/23  3:55 AM  Result Value Ref Range   Folate  15.1 >5.9 ng/mL    Comment: Performed at [facility], [address], [place], [place] [address]  Prealbumin     Status: Abnormal   Collection Time: 07/30/23  3:55 AM  Result Value Ref Range   Prealbumin 15 (L) 18 - 38 mg/dL    Comment: Performed at [facility], [address] [place], [place] [address]  CK     Status: Abnormal   Collection Time: 07/30/23  3:55 AM  Result Value Ref Range   Total CK 39 (L) 49 - 397 U/L    Comment: Performed at [facility], [address], [place], [place] [address]  Iron and TIBC     Status: Abnormal   Collection Time: 07/30/23  3:55 AM  Result Value Ref Range   Iron 37 (L) 45 - 182 ug/dL   TIBC 416 606 - 301 ug/dL   Saturation Ratios 14 (L) 17.9 - 39.5 %   UIBC 223 ug/dL    Comment: Performed at [facility], [address] [place], [place] [address]  Ferritin     Status: None   Collection Time: 07/30/23  3:55 AM  Result  Value Ref Range   Ferritin 37 24 - 336 ng/mL    Comment: Performed at [facility], [address], [place], [place] [address]  Magnesium     Status: Abnormal   Collection Time: 07/30/23  3:55 AM  Result Value Ref Range   Magnesium 1.6 (L) 1.7 - 2.4 mg/dL    Comment: Performed at [facility], [address] [place], [place] [address]  Phosphorus     Status: None   Collection Time: 07/30/23  3:55 AM  Result Value Ref Range   Phosphorus 3.6 2.5 - 4.6 mg/dL    Comment: Performed at [facility], [address] [place], [place] [address]  Osmolality     Status: Abnormal   Collection Time: 07/30/23  3:55 AM  Result Value Ref Range   Osmolality 296 (H) 275 - 295 mOsm/kg    Comment: Performed at [facility], [address], [place], [place] [address]  Reticulocytes     Status: Abnormal   Collection Time: 07/30/23  3:55 AM  Result Value Ref Range   Retic Ct Pct 1.4 0.4 - 3.1 %   RBC. 3.30 (L) 4.22 - 5.81 MIL/uL   Retic Count, Absolute 44.6 19.0 - 186.0 K/uL   Immature  Retic Fract 12.8 2.3 - 15.9 %    Comment: Performed at [facility], [address] [place], [place] [address]  TSH     Status: None   Collection Time: 07/30/23  3:55 AM  Result Value Ref Range   TSH 1.838 0.350 - 4.500 uIU/mL    Comment: Performed by a 3rd Generation assay with a functional sensitivity of <=0.01 uIU/mL. Performed at [facility], [address] [place], [place] [address]   Comprehensive metabolic panel     Status: Abnormal   Collection Time: 07/30/23  3:55 AM  Result Value Ref Range   Sodium 137 135 - 145 mmol/L   Potassium 4.2 3.5 - 5.1 mmol/L   Chloride 106 98 - 111 mmol/L   CO2 23 22 - 32 mmol/L   Glucose, Bld 113 (H) 70 - 99 mg/dL    Comment: Glucose reference range applies only to samples taken after fasting for at least 8 hours.   BUN 24 (H) 8 - 23 mg/dL   Creatinine, Ser 0.93 (H) 0.61 - 1.24 mg/dL   Calcium 8.5 (L) 8.9 - 10.3 mg/dL   Total Protein 5.6 (L) 6.5 - 8.1 g/dL   Albumin 3.0 (L) 3.5 - 5.0 g/dL   AST 15 15 - 41 U/L   ALT 12 0 - 44 U/L   Alkaline Phosphatase 49 38 - 126 U/L   Total Bilirubin 0.7 0.3 - 1.2 mg/dL   GFR, Estimated 42 (L) >60 mL/min    Comment: (NOTE) Calculated using the CKD-EPI Creatinine Equation (2021)    Anion gap 8 5 - 15    Comment: Performed at [facility], [address], [place], [place] [address]  Glucose, capillary     Status: Abnormal   Collection Time: 07/30/23  4:07 AM  Result Value Ref Range   Glucose-Capillary 103 (H) 70 - 99 mg/dL    Comment: Glucose reference range applies only to samples taken after fasting for at least 8 hours.  Urinalysis, Complete w Microscopic -Urine, Clean Catch     Status: Abnormal   Collection Time: 07/30/23  5:49 AM  Result Value Ref Range   Color, Urine YELLOW YELLOW  APPearance CLEAR CLEAR   Specific Gravity, Urine 1.025 1.005 - 1.030   pH 5.5 5.0 - 8.0   Glucose, UA NEGATIVE NEGATIVE mg/dL   Hgb urine dipstick NEGATIVE NEGATIVE   Bilirubin Urine NEGATIVE  NEGATIVE   Ketones, ur NEGATIVE NEGATIVE mg/dL   Protein, ur NEGATIVE NEGATIVE mg/dL   Nitrite NEGATIVE NEGATIVE   Leukocytes,Ua NEGATIVE NEGATIVE   Squamous Epithelial / HPF 0-5 0 - 5 /HPF   WBC, UA 0-5 0 - 5 WBC/hpf   RBC / HPF 0-5 0 - 5 RBC/hpf   Bacteria, UA RARE (A) NONE SEEN   Mucus PRESENT     Comment: Performed at [facility], [address] [place], [place] [address]  Creatinine, urine, random     Status: None   Collection Time: 07/30/23  5:49 AM  Result Value Ref Range   Creatinine, Urine 164 mg/dL    Comment: Performed at [facility], [address] [place], [place] [address]  Osmolality, urine     Status: None   Collection Time: 07/30/23  5:49 AM  Result Value Ref Range   Osmolality, Ur 593 300 - 900 mOsm/kg    Comment: Performed at [facility], [address] [place], [place] [address]  Sodium, urine, random     Status: None   Collection Time: 07/30/23  5:49 AM  Result Value Ref Range   Sodium, Ur 96 mmol/L    Comment: Performed at [facility], [address], [place], [place] [address]  Glucose, capillary     Status: Abnormal   Collection Time: 07/30/23  7:39 AM  Result Value Ref Range   Glucose-Capillary 106 (H) 70 - 99 mg/dL    Comment: Glucose reference range applies only to samples taken after fasting for at least 8 hours.    CT HIP LEFT WO CONTRAST  Result Date: 07/30/2023 CLINICAL DATA:  Recent fall with known proximal left periprosthetic femoral fracture, initial encounter EXAM: CT OF THE LEFT HIP WITHOUT CONTRAST TECHNIQUE: Multidetector CT imaging of the left hip was performed according to the standard protocol. Multiplanar CT image reconstructions were also generated. RADIATION DOSE REDUCTION: This exam was performed according to the departmental dose-optimization program which includes automated exposure control, adjustment of the mA and/or kV according to patient size and/or use of iterative reconstruction technique.  COMPARISON:  Plain film from the previous day. FINDINGS: Bones/Joint/Cartilage Degenerative changes of lumbar spine are seen. Left hip replacement is noted in satisfactory position without evidence of dislocation. In the proximal femur along the femoral portion of the prosthesis there is a periprosthetic fracture with displacement of the greater trochanter laterally. No other fractures are seen. Ligaments Suboptimally assessed by CT. Muscles and Tendons Surrounding musculature shows some mild edematous changes although no focal hematoma is noted. Soft tissues No joint effusion is seen. The surrounding soft tissues are otherwise within normal limits. IMPRESSION: Periprosthetic proximal left femoral fracture involving primarily the greater trochanter. Electronically Signed   By: Alcide Clever M.D.   On: 07/30/2023 01:09   DG Knee Left Port  Result Date: 07/29/2023 CLINICAL DATA:  Larey Seat, known left hip fracture, pain EXAM: PORTABLE LEFT KNEE - 1-2 VIEW COMPARISON:  07/29/2023 FINDINGS: Frontal, bilateral oblique, and cross-table lateral views of the left knee are obtained. No acute displaced fracture, subluxation, or dislocation. 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 No joint effusion. Soft tissues are unremarkable. IMPRESSION: 1. Osteoarthritis.  No acute fracture. Electronically Signed  By: Sharlet Salina M.D.   On: 07/29/2023 20:16   DG Chest Port 1 View  Result Date: 07/29/2023 CLINICAL DATA:  Left hip fracture, fell, previous abnormal chest x-ray EXAM: PORTABLE CHEST 1 VIEW COMPARISON:  07/29/2023 FINDINGS: Single frontal view of the chest demonstrates an unremarkable cardiac silhouette. Continued ectasia of the thoracic aorta. No airspace disease, effusion, or pneumothorax. Stable calcified pleural plaques along the hemidiaphragms. No acute bony abnormalities. IMPRESSION: 1. No acute intrathoracic process. Specifically, no evidence of pneumothorax. The density seen  previously was consistent with a skin fold. Electronically Signed   By: Sharlet Salina M.D.   On: 07/29/2023 20:16   DG Chest Portable 1 View  Result Date: 07/29/2023 CLINICAL DATA:  Tripped and fell. Found lying on the ground. Left hip pain. EXAM: PORTABLE CHEST 1 VIEW COMPARISON:  08/02/2019. FINDINGS: Cardiac silhouette is normal in size. No mediastinal or hilar masses. Lungs are mildly hyperexpanded. Partly calcified pleural plaques are noted over the lung bases. There is a smooth aligned at extends along the peripheral margin of the left lung from the apex to the lateral base. This is suspected to be a soft tissue shadow. Cannot completely exclude a pneumothorax. Lungs are clear. No pleural effusion. No evidence of a right pneumothorax. Skeletal structures are grossly intact. IMPRESSION: 1. Curved line extending along the periphery of the left lung is suspected to be a soft tissue shadow. Cannot completely exclude a pneumothorax, however. Recommend repeat radiographs, with attempt to have the patient physician true AP. Current exam is rotated to the right. 2. No other evidence of acute cardiopulmonary disease. Electronically Signed   By: Amie Portland M.D.   On: 07/29/2023 15:56   DG Hip Unilat With Pelvis 2-3 Views Left  Result Date: 07/29/2023 CLINICAL DATA:  Fall, pain. EXAM: DG HIP (WITH OR WITHOUT PELVIS) 3V LEFT COMPARISON:  None Available. FINDINGS: Patient is status post bilateral total hip arthroplasty. Acute fracture identified of the left proximal femur laterally at the base of the greater trochanter displaced by couple of mm. Pelvic ring is intact. Lumbosacral degenerative changes. No osteolytic or osteoblastic changes. IMPRESSION: Acute fracture of the proximal left femur in the subtrochanteric region displaced by few mm. Otherwise unremarkable status post bilateral total hip arthroplasty. Electronically Signed   By: Layla Maw M.D.   On: 07/29/2023 15:22    Review of Systems   HENT:  Negative for ear discharge, ear pain, hearing loss and tinnitus.   Eyes:  Negative for photophobia and pain.  Respiratory:  Negative for cough and shortness of breath.   Cardiovascular:  Negative for chest pain.  Gastrointestinal:  Negative for abdominal pain, nausea and vomiting.  Genitourinary:  Negative for dysuria, flank pain, frequency and urgency.  Musculoskeletal:  Positive for arthralgias (Left hip). Negative for back pain, myalgias and neck pain.  Neurological:  Negative for dizziness and headaches.  Hematological:  Does not bruise/bleed easily.  Psychiatric/Behavioral:  The patient is not nervous/anxious.    Blood pressure 99/60, pulse 84, temperature 98.1 F (36.7 C), temperature source Oral, resp. rate 19, height 5\' 10"  (1.778 m), weight 91.6 kg, SpO2 97%. Physical Exam Constitutional:      General: He is not in acute distress.    Appearance: He is well-developed. He is not diaphoretic.  HENT:     Head: Normocephalic and atraumatic.  Eyes:     General: No scleral icterus.       Right eye: No discharge.  Left eye: No discharge.     Conjunctiva/sclera: Conjunctivae normal.  Cardiovascular:     Rate and Rhythm: Normal rate and regular rhythm.  Pulmonary:     Effort: Pulmonary effort is normal. No respiratory distress.  Musculoskeletal:     Cervical back: Normal range of motion.     Comments: LLE No traumatic wounds, ecchymosis, or rash  Mod TTP hip  No knee or ankle effusion  Knee stable to varus/ valgus and anterior/posterior stress  Sens DPN, SPN, TN intact  Motor EHL, ext, flex, evers 5/5  DP 2+, PT 1+, No significant edema  Skin:    General: Skin is warm and dry.  Neurological:     Mental Status: He is alert.  Psychiatric:        Mood and Affect: Mood normal.        Behavior: Behavior normal.     Assessment/Plan: Left hip fx -- Plan non-operative management with WBAT LLE. F/u with Dr. Jena Gauss in 2-3 weeks.    Freeman Caldron,  PA-C Orthopedic Surgery [phone] 07/30/2023, 9:03 AM

## 2023-07-30 NOTE — Progress Notes (Signed)
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  GOAL:   Patient will meet greater than or equal to 90% of their needs  MONITOR:   PO intake, Supplement acceptance, Labs, Weight trends  REASON FOR ASSESSMENT:   Consult Hip fracture protocol  ASSESSMENT:   82 year old male who presented to the ED on 9/15 after a fall. PMH of T2DM, CKD stage IIIb, anemia. Pt admitted with hip fracture.  Per Orthopedics note, ORIF for today of L periprosthetic hip fracture has been cancelled. Orthopedics recommending WBAT and nonoperative management.  Spoke with pt and wife at bedside. Pt reports not having had anything to eat yet today. He did not order breakfast. Pt does not feel very hungry at the moment due to pain. RD assisted pt in ordering lunch meal tray (egg salad sandwich, [place] fries, water).  Pt reports that at baseline he usually eats 3 meals daily. Lunch includes a sandwich. Dinner is a sandwich, salad, or fish dinner from a restaurant.  Pt endorses weight loss slowly over time. He reports that several years ago he weighed 237 lbs and that he now weighs 194 lbs. Reviewed weight history in chart. Pt with a ~14 kg weight loss since September 2021. Difficult to determine if weight loss occurred more acutely as last available weight PTA is from September 2021.  Pt willing to consume an oral nutrition to aid in meeting increased nutrient needs. Will also order daily renal MVI given CKD stage IIIb.  Pt denies any difficulty chewing or swallowing. He reports nausea only when he takes pain medication which is why he is trying not to take it.  Noted vitamin D level checked which is WNL at  38.14.  Medications reviewed and include: SSI every 4 hours, IV magnesium sulfate 2 grams x 1 IVF: NS @ 75 ml/hr  Labs reviewed: BUN 24, creatinine 1.62, magnesium 1.6, hemoglobin 9.3 CBG's: 82-107 x 24 hours  NUTRITION - FOCUSED PHYSICAL EXAM:  Flowsheet Row Most Recent Value  Orbital Region Moderate depletion  Upper Arm Region No depletion  Thoracic and Lumbar Region No depletion  Buccal Region No depletion  Temple Region No depletion  Clavicle Bone Region Mild depletion  Clavicle and Acromion Bone Region Mild depletion  Scapular Bone Region No depletion  Dorsal Hand No depletion  Patellar Region No depletion  Anterior Thigh Region Mild depletion  Posterior Calf Region No depletion  Edema (RD Assessment) Mild  [LLE]  Hair Reviewed  Eyes Reviewed  Mouth Reviewed  Skin Reviewed  Nails Reviewed   Diet Order:   Diet Order             Diet regular Room service appropriate? Yes; Fluid consistency: Thin  Diet effective now                   EDUCATION NEEDS:   Education needs have been addressed  Skin:  Skin Assessment: Reviewed RN Assessment  Last BM:  07/29/23  Height:   Ht Readings from Last 1 Encounters:  07/29/23 5\' 10"  (1.778 m)    Weight:   Wt Readings from Last 1 Encounters:  07/29/23 91.6 kg    BMI:  Body mass index is 28.98  kg/m.  Estimated Nutritional Needs:   Kcal:  1900-2100  Protein:  90-110 grams  Fluid:  1.9-2.1 L    Mertie Clause, MS, RD, LDN Registered Dietitian II Please see AMiON for contact information.

## 2023-07-30 NOTE — [place] FL2 (Signed)
[place] MEDICA[place]2 LEVEL OF CARE FORM     IDENTIFICATION  Patient Name: Kevin Suarez Birthdate: 09-02-1941 Sex: male Admission Date (Current Location): 07/29/2023  [facility] and [place] Number:  Producer, television/film/video and Address:  [facility], [address], [place], [place] [address]      Provider Number: [id number]  Attending Physician Name and Address:  Briant Cedar, MD  Relative Name and Phone Number:  [place], [place] [phone]  [phone]    Current Level of Care: Hospital Recommended Level of Care: Skilled Nursing Facility Prior Approval Number:    Date Approved/Denied:   PASRR Number: [id number]  Discharge Plan: SNF    Current Diagnoses: Patient Active Problem List   Diagnosis Date Noted   Closed left hip fracture (HCC) 07/29/2023   CKD (chronic kidney disease) stage 3, GFR 30-59 ml/min (HCC) 07/29/2023   Chronic pain syndrome 11/30/2022   Degeneration of lumbar intervertebral disc 11/30/2022   Inflammation of sacroiliac joint (HCC) 11/30/2022   Lumbar spondylosis 11/30/2022   Neuropathy 11/30/2022   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   Vitreous floaters of right eye 05/23/2022   Posterior vitreous detachment of both eyes 08/09/2020   Exudative age-related macular degeneration of right eye with active choroidal neovascularization (HCC) 03/10/2020   Intermediate stage nonexudative age-related macular degeneration of both eyes 03/10/2020   Serous detachment of retinal pigment epithelium of right eye 03/10/2020   Diabetes mellitus without complication (HCC) 03/10/2020   Pseudophakia 03/10/2020   Degenerative retinal drusen of left eye 03/10/2020   Degenerative retinal drusen of right eye 03/10/2020   Chronic eczematous otitis externa of both ears 09/16/2019   Noise effect on both inner ears 09/16/2019   Presbycusis of both ears 09/16/2019   BPH (benign prostatic hyperplasia)    Anemia 10/19/2015   Lung nodules 10/11/2011    Hyperlipidemia    GERD (gastroesophageal reflux disease)    Obesity    Benign prostatic hyperplasia    Diabetes mellitus (HCC)     Orientation RESPIRATION BLADDER Height & Weight     Self, Time, Situation, Place  Normal Continent Weight: 201 lb 15.1 oz (91.6 kg) Height:  5\' 10"  (177.8 cm)  BEHAVIORAL SYMPTOMS/MOOD NEUROLOGICAL BOWEL NUTRITION STATUS      Continent Diet (see discharge summary)  AMBULATORY STATUS COMMUNICATION OF NEEDS Skin   Limited Assist Verbally Skin abrasions                       Personal Care Assistance Level of Assistance  Bathing, Feeding, Dressing Bathing Assistance: Maximum assistance Feeding assistance: Limited assistance Dressing Assistance: Maximum assistance     Functional Limitations Info  Sight, Hearing, Speech Sight Info: Adequate Hearing Info: Impaired Speech Info: Adequate    SPECIAL CARE FACTORS FREQUENCY  PT (By licensed PT), OT (By licensed OT)     PT Frequency: 5x week OT Frequency: 5x week            Contractures Contractures Info: Not present    Additional Factors Info  Code Status, Allergies, Insulin Sliding Scale Code Status Info: full Allergies Info: prednisone   Insulin Sliding Scale Info: Novolog: see discharge summary       Current Medications (07/30/2023):  This is the current hospital active medication list Current Facility-Administered Medications  Medication Dose Route Frequency Provider Last Rate Last Admin   0.9 %  sodium chloride infusion   Intravenous Continuous Briant Cedar, MD 100 mL/hr at 07/30/23 1418 Rate Change at  07/30/23 1418   Chlorhexidine Gluconate Cloth 2 % PADS 6 each  6 each Topical Q0600 Therisa Doyne, MD   6 each at 07/30/23 0551   enoxaparin (LOVENOX) injection 40 mg  40 mg Subcutaneous Q24H Briant Cedar, MD   40 mg at 07/30/23 1419   feeding supplement (ENSURE ENLIVE / ENSURE PLUS) liquid 237 mL  237 mL Oral Q24H Briant Cedar, MD   237 mL at 07/30/23  1421   [START ON 07/31/2023] ferrous sulfate tablet 325 mg  325 mg Oral Q breakfast Briant Cedar, MD       finasteride (PROSCAR) tablet 5 mg  5 mg Oral Daily Therisa Doyne, MD   5 mg at 07/30/23 8657   HYDROcodone-acetaminophen (NORCO/VICODIN) 5-325 MG per tablet 1-2 tablet  1-2 tablet Oral Q6H PRN Therisa Doyne, MD   2 tablet at 07/30/23 1139   insulin aspart (novoLOG) injection 0-9 Units  0-9 Units Subcutaneous Q4H Doutova, Anastassia, MD       methocarbamol (ROBAXIN) tablet 500 mg  500 mg Oral Q6H PRN Therisa Doyne, MD       Or   methocarbamol (ROBAXIN) 500 mg in dextrose 5 % 50 mL IVPB  500 mg Intravenous Q6H PRN Doutova, Anastassia, MD       morphine (PF) 2 MG/ML injection 0.5 mg  0.5 mg Intravenous Q2H PRN Doutova, Anastassia, MD       multivitamin (RENA-VIT) tablet 1 tablet  1 tablet Oral QHS Briant Cedar, MD       mupirocin ointment (BACTROBAN) 2 % 1 Application  1 Application Nasal BID Therisa Doyne, MD   1 Application at 07/30/23 0821   ondansetron (ZOFRAN) injection 4 mg  4 mg Intravenous Q6H PRN Therisa Doyne, MD   4 mg at 07/30/23 1139   tamsulosin (FLOMAX) capsule 0.4 mg  0.4 mg Oral QHS Doutova, Anastassia, MD   0.4 mg at 07/29/23 2340     Discharge Medications: Please see discharge summary for a list of discharge medications.  Relevant Imaging Results:  Relevant Lab Results:   Additional Information SSN: [id number]  Lorri Frederick, LCSW

## 2023-07-30 NOTE — Progress Notes (Signed)
Ortho Trauma Note  I have reviewed the patient's imaging and CT scan as he was tentatively posted for ORIF today with me. His stem appears stable distal to the fracture. I will recommend WBAT and nonoperative management. I have cancelled his surgery for today. Will plan to mobilize with physical therapy.  Roby Lofts, MD [facility] [phone] (office) [email or web address]

## 2023-07-30 NOTE — Progress Notes (Signed)
PROGRESS NOTE  [name]:[id number] DOB: 03-Mar-1941 DOA: 07/29/2023 PCP: Gaspar Garbe, MD  HPI/Recap of past 24 hours: Kevin Suarez is a 82 y.o. male with medical history significant of DM2, CKD 3b, anemia, presented with mechanical fall, did not hit head or LOC. Denies any CP or SOB prior to fall. In the ED, imaging showed L periprosthetic hip fx and orthopedics was consulted. Pt admitted for further management.    Today, pt denies any new complaints. Reports some pain upon movement.   Assessment/Plan: Principal Problem:   Closed left hip fracture (HCC) Active Problems:   Benign prostatic hyperplasia   Diabetes mellitus (HCC)   Anemia   BPH (benign prostatic hyperplasia)   CKD (chronic kidney disease) stage 3, GFR 30-59 ml/min (HCC)    Left periprosthetic hip fx Mechanical fall Orthopedic consulted, recommended non-operative management with WBAT LLE Pain management PT/OT Fall precautions Outpatient follow up  Hypomagnesemia Replace prn  Iron def anemia Unknown Hgb baseline Anemia panel showed iron 37, sats 14 Iron supplementation Daily CBC  HTN BP soft Continue IVF  DM type 2 Last A1c 7 SSI, accuchecks, hypoglycemic protocol  ??AKI on CKD stage IIIa Baseline Cr unknown Continue IVF Daily BMP  BPH Continue finasteride, flomax      Malnutrition Type:  Nutrition Problem: Increased nutrient needs Etiology: hip fracture   Malnutrition Characteristics:  Signs/Symptoms: estimated needs   Nutrition Interventions:  Interventions: Ensure Enlive (each supplement provides 350kcal and 20 grams of protein), MVI    Estimated body mass index is 28.98 kg/m as calculated from the following:   Height as of this encounter: 5\' 10"  (1.778 m).   Weight as of this encounter: 91.6 kg.     Code Status: Full  Family Communication: None at bedside  Disposition Plan: Status is: Inpatient Remains inpatient appropriate because: Level of  care      Consultants: Orthopedics  Procedures: None  Antimicrobials: None  DVT prophylaxis:  Lovenox   Objective: Vitals:   07/29/23 2234 07/29/23 2347 07/30/23 0540 07/30/23 0737  BP: (!) 102/50 117/81 126/63 99/60  Pulse: 98 96 77 84  Resp: 17 11 19    Temp: 97.9 F (36.6 C) 98.1 F (36.7 C) 98.8 F (37.1 C) 98.1 F (36.7 C)  TempSrc: Oral Oral Oral Oral  SpO2: 100% 95% 97%   Weight:      Height:       No intake or output data in the 24 hours ending 07/30/23 1303 Filed Weights   07/29/23 2213  Weight: 91.6 kg    Exam: General: NAD  Cardiovascular: S1, S2 present Respiratory: CTAB Abdomen: Soft, nontender, nondistended, bowel sounds present Musculoskeletal: No bilateral pedal edema noted Skin: Normal Psychiatry: Normal mood     Data Reviewed: CBC: Recent Labs  Lab 07/29/23 1520 07/30/23 0355  WBC 12.2* 8.6  NEUTROABS 9.5*  --   HGB 10.7* 9.3*  HCT 32.9* 28.3*  MCV 84.6 84.0  PLT 224 197   Basic Metabolic Panel: Recent Labs  Lab 07/29/23 1520 07/30/23 0355  NA 137 137  K 4.0 4.2  CL 107 106  CO2 20* 23  GLUCOSE 103* 113*  BUN 25* 24*  CREATININE 1.81* 1.62*  CALCIUM 8.9 8.5*  MG  --  1.6*  PHOS  --  3.6   GFR: Estimated Creatinine Clearance: 39.3 mL/min (A) (by C-G formula based on SCr of 1.62 mg/dL (H)). Liver Function Tests: Recent Labs  Lab 07/29/23 1520 07/30/23 0355  AST 17  15  ALT 13 12  ALKPHOS 46 49  BILITOT 0.7 0.7  PROT 6.5 5.6*  ALBUMIN 3.6 3.0*   No results for input(s): "LIPASE", "AMYLASE" in the last 168 hours. No results for input(s): "AMMONIA" in the last 168 hours. Coagulation Profile: No results for input(s): "INR", "PROTIME" in the last 168 hours. Cardiac Enzymes: Recent Labs  Lab 07/30/23 0355  CKTOTAL 39*   BNP (last 3 results) No results for input(s): "PROBNP" in the last 8760 hours. HbA1C: No results for input(s): "HGBA1C" in the last 72 hours. CBG: Recent Labs  Lab 07/29/23 2029  07/29/23 2345 07/30/23 0407 07/30/23 0739 07/30/23 1101  GLUCAP 93 107* 103* 106* 85   Lipid Profile: No results for input(s): "CHOL", "HDL", "LDLCALC", "TRIG", "CHOLHDL", "LDLDIRECT" in the last 72 hours. Thyroid Function Tests: Recent Labs    07/30/23 0355  TSH 1.838   Anemia Panel: Recent Labs    07/30/23 0355  VITAMINB12 452  FOLATE 15.1  FERRITIN 37  TIBC 260  IRON 37*  RETICCTPCT 1.4   Urine analysis:    Component Value Date/Time   COLORURINE YELLOW 07/30/2023 0549   APPEARANCEUR CLEAR 07/30/2023 0549   LABSPEC 1.025 07/30/2023 0549   PHURINE 5.5 07/30/2023 0549   GLUCOSEU NEGATIVE 07/30/2023 0549   HGBUR NEGATIVE 07/30/2023 0549   BILIRUBINUR NEGATIVE 07/30/2023 0549   KETONESUR NEGATIVE 07/30/2023 0549   PROTEINUR NEGATIVE 07/30/2023 0549   UROBILINOGEN 1.0 09/21/2011 0146   NITRITE NEGATIVE 07/30/2023 0549   LEUKOCYTESUR NEGATIVE 07/30/2023 0549   Sepsis Labs: @LABRCNTIP (procalcitonin:4,lacticidven:4)  ) Recent Results (from the past 240 hour(s))  Surgical pcr screen     Status: Abnormal   Collection Time: 07/29/23 11:21 PM   Specimen: Nasal Mucosa; Nasal Swab  Result Value Ref Range Status   MRSA, PCR NEGATIVE NEGATIVE Final   Staphylococcus aureus POSITIVE (A) NEGATIVE Final    Comment: (NOTE) The Xpert SA Assay (FDA approved for NASAL specimens in patients 80 years of age and older), is one component of a comprehensive surveillance program. It is not intended to diagnose infection nor to guide or monitor treatment. Performed at [facility], [address], [place], [place] [address]       Studies: CT HIP LEFT WO CONTRAST  Result Date: 07/30/2023 CLINICAL DATA:  Recent fall with known proximal left periprosthetic femoral fracture, initial encounter EXAM: CT OF THE LEFT HIP WITHOUT CONTRAST TECHNIQUE: Multidetector CT imaging of the left hip was performed according to the standard protocol. Multiplanar CT image reconstructions were  also generated. RADIATION DOSE REDUCTION: This exam was performed according to the departmental dose-optimization program which includes automated exposure control, adjustment of the mA and/or kV according to patient size and/or use of iterative reconstruction technique. COMPARISON:  Plain film from the previous day. FINDINGS: Bones/Joint/Cartilage Degenerative changes of lumbar spine are seen. Left hip replacement is noted in satisfactory position without evidence of dislocation. In the proximal femur along the femoral portion of the prosthesis there is a periprosthetic fracture with displacement of the greater trochanter laterally. No other fractures are seen. Ligaments Suboptimally assessed by CT. Muscles and Tendons Surrounding musculature shows some mild edematous changes although no focal hematoma is noted. Soft tissues No joint effusion is seen. The surrounding soft tissues are otherwise within normal limits. IMPRESSION: Periprosthetic proximal left femoral fracture involving primarily the greater trochanter. Electronically Signed   By: Alcide Clever M.D.   On: 07/30/2023 01:09   DG Knee Left Port  Result Date: 07/29/2023  CLINICAL DATA:  Larey Seat, known left hip fracture, pain EXAM: PORTABLE LEFT KNEE - 1-2 VIEW COMPARISON:  07/29/2023 FINDINGS: Frontal, bilateral oblique, and cross-table lateral views of the left knee are obtained. No acute displaced fracture, subluxation, or dislocation. 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 No joint effusion. Soft tissues are unremarkable. IMPRESSION: 1. Osteoarthritis.  No acute fracture. Electronically Signed   By: Sharlet Salina M.D.   On: 07/29/2023 20:16   DG Chest Port 1 View  Result Date: 07/29/2023 CLINICAL DATA:  Left hip fracture, fell, previous abnormal chest x-ray EXAM: PORTABLE CHEST 1 VIEW COMPARISON:  07/29/2023 FINDINGS: Single frontal view of the chest demonstrates an unremarkable cardiac silhouette. Continued ectasia of  the thoracic aorta. No airspace disease, effusion, or pneumothorax. Stable calcified pleural plaques along the hemidiaphragms. No acute bony abnormalities. IMPRESSION: 1. No acute intrathoracic process. Specifically, no evidence of pneumothorax. The density seen previously was consistent with a skin fold. Electronically Signed   By: Sharlet Salina M.D.   On: 07/29/2023 20:16   DG Chest Portable 1 View  Result Date: 07/29/2023 CLINICAL DATA:  Tripped and fell. Found lying on the ground. Left hip pain. EXAM: PORTABLE CHEST 1 VIEW COMPARISON:  08/02/2019. FINDINGS: Cardiac silhouette is normal in size. No mediastinal or hilar masses. Lungs are mildly hyperexpanded. Partly calcified pleural plaques are noted over the lung bases. There is a smooth aligned at extends along the peripheral margin of the left lung from the apex to the lateral base. This is suspected to be a soft tissue shadow. Cannot completely exclude a pneumothorax. Lungs are clear. No pleural effusion. No evidence of a right pneumothorax. Skeletal structures are grossly intact. IMPRESSION: 1. Curved line extending along the periphery of the left lung is suspected to be a soft tissue shadow. Cannot completely exclude a pneumothorax, however. Recommend repeat radiographs, with attempt to have the patient physician true AP. Current exam is rotated to the right. 2. No other evidence of acute cardiopulmonary disease. Electronically Signed   By: Amie Portland M.D.   On: 07/29/2023 15:56   DG Hip Unilat With Pelvis 2-3 Views Left  Result Date: 07/29/2023 CLINICAL DATA:  Fall, pain. EXAM: DG HIP (WITH OR WITHOUT PELVIS) 3V LEFT COMPARISON:  None Available. FINDINGS: Patient is status post bilateral total hip arthroplasty. Acute fracture identified of the left proximal femur laterally at the base of the greater trochanter displaced by couple of mm. Pelvic ring is intact. Lumbosacral degenerative changes. No osteolytic or osteoblastic changes. IMPRESSION:  Acute fracture of the proximal left femur in the subtrochanteric region displaced by few mm. Otherwise unremarkable status post bilateral total hip arthroplasty. Electronically Signed   By: Layla Maw M.D.   On: 07/29/2023 15:22    Scheduled Meds:  Chlorhexidine Gluconate Cloth  6 each Topical Q0600   feeding supplement  237 mL Oral Q24H   finasteride  5 mg Oral Daily   insulin aspart  0-9 Units Subcutaneous Q4H   multivitamin  1 tablet Oral QHS   mupirocin ointment  1 Application Nasal BID   tamsulosin  0.4 mg Oral QHS    Continuous Infusions:  sodium chloride 75 mL/hr at 07/30/23 0815   methocarbamol (ROBAXIN) IV       LOS: 1 day     Briant Cedar, MD [facility]  If 7PM-7AM, please contact night-coverage [email or web address] 07/30/2023, 1:03 PM

## 2023-07-30 NOTE — TOC Initial Note (Signed)
Transition of Care [facility]) - Initial/Assessment Note    Patient Details  Name: Kevin Suarez MRN: [id number] Date of Birth: 05-09-1941  [facility] CM/SW Contact:    Kevin Frederick, LCSW Phone Number: 07/30/2023, 4:20 PM  Clinical Narrative:     CSW met with pt regarding DC recommendation for SNF.  Pt hesitant but agreeable.  Permission given to send out referral in hub. Pt from home with wife, permission given to speak with wife   Kevin Suarez and daughter Kevin Suarez.  No current home services.  CSW brought medicare choice document back to the room, wife now present.  Pt less certain about SNF, wants to talk with wife about possible DC home but still agreeable for CSW to make SNF referral.  Will discuss further in AM.            Expected Discharge Plan: Skilled Nursing Facility Barriers to Discharge: Continued Medical Work up, SNF Pending bed offer   Patient Goals and CMS Choice Patient states their goals for this hospitalization and ongoing recovery are:: back to where I was CMS Medicare.gov Compare Post Acute Care list provided to:: Patient Choice offered to / list presented to : Patient, Spouse      Expected Discharge Plan and Services In-house Referral: Clinical Social Work   Post Acute Care Choice: Skilled Nursing Facility Living arrangements for the past 2 months: Single Family Home                                      Prior Living Arrangements/Services Living arrangements for the past 2 months: Single Family Home Lives with:: Spouse Patient language and need for interpreter reviewed:: Yes Do you feel safe going back to the place where you live?: Yes      Need for Family Participation in Patient Care: Yes (Comment) Care giver support system in place?: Yes (comment) Current home services: Other (comment) (none) Criminal Activity/Legal Involvement Pertinent to Current Situation/Hospitalization: No - Comment as needed  Activities of Daily Living Home  Assistive Devices/Equipment: None ADL Screening (condition at time of admission) Patient's cognitive ability adequate to safely complete daily activities?: Yes Is the patient deaf or have difficulty hearing?: Yes Does the patient have difficulty seeing, even when wearing glasses/contacts?: No Does the patient have difficulty concentrating, remembering, or making decisions?: No Patient able to express need for assistance with ADLs?: Yes Does the patient have difficulty dressing or bathing?: No Independently performs ADLs?: Yes (appropriate for developmental age) Does the patient have difficulty walking or climbing stairs?: No Weakness of Legs: None Weakness of Arms/Hands: None  Permission Sought/Granted Permission sought to share information with : Family Supports Permission granted to share information with : Yes, Verbal Permission Granted  Share Information with NAME: wife and daughter  Permission granted to share info w AGENCY: SNF        Emotional Assessment Appearance:: Appears stated age Attitude/Demeanor/Rapport: Engaged Affect (typically observed): Appropriate, Pleasant Orientation: : Oriented to Self, Oriented to Place, Oriented to  Time, Oriented to Situation      Admission diagnosis:  Closed left hip fracture (HCC) [S72.002A] Closed fracture of left hip, initial encounter (HCC) [S72.002A] Patient Active Problem List   Diagnosis Date Noted   Closed left hip fracture (HCC) 07/29/2023   CKD (chronic kidney disease) stage 3, GFR 30-59 ml/min (HCC) 07/29/2023   Chronic pain syndrome 11/30/2022   Degeneration of lumbar intervertebral disc  11/30/2022   Inflammation of sacroiliac joint (HCC) 11/30/2022   Lumbar spondylosis 11/30/2022   Neuropathy 11/30/2022   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   Vitreous floaters of right eye 05/23/2022   Posterior vitreous detachment of both eyes 08/09/2020   Exudative age-related macular degeneration of right eye with active choroidal  neovascularization (HCC) 03/10/2020   Intermediate stage nonexudative age-related macular degeneration of both eyes 03/10/2020   Serous detachment of retinal pigment epithelium of right eye 03/10/2020   Diabetes mellitus without complication (HCC) 03/10/2020   Pseudophakia 03/10/2020   Degenerative retinal drusen of left eye 03/10/2020   Degenerative retinal drusen of right eye 03/10/2020   Chronic eczematous otitis externa of both ears 09/16/2019   Noise effect on both inner ears 09/16/2019   Presbycusis of both ears 09/16/2019   BPH (benign prostatic hyperplasia)    Anemia 10/19/2015   Lung nodules 10/11/2011   Hyperlipidemia    GERD (gastroesophageal reflux disease)    Obesity    Benign prostatic hyperplasia    Diabetes mellitus (HCC)    PCP:  Kevin Garbe, MD Pharmacy:   [facility] [phone] - [place], [place] - [address] AT [facility] [address] [place] [address] Phone: [phone] Fax: [phone]     Social Determinants of Health (SDOH) Social History: SDOH Screenings   Food Insecurity: No Food Insecurity (07/29/2023)  Housing: Low Risk  (07/29/2023)  Transportation Needs: No Transportation Needs (07/29/2023)  Utilities: Not At Risk (07/29/2023)  Tobacco Use: Medium Risk (07/29/2023)   SDOH Interventions:     Readmission Risk Interventions     No data to display

## 2023-07-30 NOTE — Plan of Care (Signed)
Problem: Nutritional: Goal: Maintenance of adequate nutrition will improve Outcome: Progressing   Problem: Skin Integrity: Goal: Risk for impaired skin integrity will decrease Outcome: Progressing   Problem: Activity: Goal: Risk for activity intolerance will decrease Outcome: Progressing

## 2023-07-30 NOTE — Evaluation (Signed)
Physical Therapy Evaluation Patient Details Name: Kevin Suarez MRN: [id number] DOB: 10-Jan-1941 Today's Date: 07/30/2023  History of Present Illness  Pt is 82 yo male admitted on 07/29/23 after fall with closed L hip periprosthetic fracture.  Orthopedics consulted and reports stable fracture to be managed nonoperatively and with WBAT.  Pt with hx including DM2, CKD, anemia, and bil THA ~20 years ago.  Clinical Impression  Pt admitted with above diagnosis. At baseline, pt is ambulatory without AD, independent, and this is his first fall.  He lives at home with his wife.  Today, pt was premedicated for pain and eager to mobilize.  He required mod A for transfers and was able to step pivot toward R side with min/mod A.  Pt's pain was well controlled during session.  He did have some c/o numbness/decreased sensation in L LE and had very weak quad contraction (did notify hospitalist and ortho PA). Pt did well for first time OOB and with good rehab potential.  Pt would rather return home at d/c but is likely open to post acute rehab if needed.  At this time do recommend, patient will benefit from continued inpatient follow up therapy, <3 hours/day at d/c.  Pt currently with functional limitations due to the deficits listed below (see PT Problem List). Pt will benefit from acute skilled PT to increase their independence and safety with mobility to allow discharge.           If plan is discharge home, recommend the following: A lot of help with walking and/or transfers;A lot of help with bathing/dressing/bathroom;Assistance with cooking/housework;Help with stairs or ramp for entrance   Can travel by private vehicle   No    Equipment Recommendations Rolling walker (2 wheels);Wheelchair cushion (measurements PT);Wheelchair (measurements PT);BSC/3in1  Recommendations for Other Services       Functional Status Assessment Patient has had a recent decline in their functional status and demonstrates the  ability to make significant improvements in function in a reasonable and predictable amount of time.     Precautions / Restrictions Precautions Precautions: Fall Restrictions Weight Bearing Restrictions: Yes LLE Weight Bearing: Weight bearing as tolerated      Mobility  Bed Mobility Overal bed mobility: Needs Assistance Bed Mobility: Supine to Sit     Supine to sit: Mod assist     General bed mobility comments: Assist for L LE, scooting bottom with pad, and to lift trunk.  Cues for sequencign but pt tolerating well and assiting as pain allows    Transfers Overall transfer level: Needs assistance Equipment used: Rolling walker (2 wheels) Transfers: Sit to/from Stand, Bed to chair/wheelchair/BSC Sit to Stand: Mod assist, From elevated surface   Step pivot transfers: Mod assist       General transfer comment: Cues for hand placement and L LE management.  Had pt push up on R LE to stand with mod A.  Practiced weight shifting to L LE and pt minimally able to shift onto L.  Then performed step pivot to chair on R side with cues for RW and limited weight shift onto L due to pain and weakness. Required assist to control descent    Ambulation/Gait               General Gait Details: only small steps to chair  Stairs            Wheelchair Mobility     Tilt Bed    Modified Rankin (Stroke Patients Only)  Balance Overall balance assessment: Needs assistance Sitting-balance support: No upper extremity supported Sitting balance-Leahy Scale: Good     Standing balance support: Bilateral upper extremity supported, Reliant on assistive device for balance Standing balance-Leahy Scale: Poor Standing balance comment: RW and CGA                             Pertinent Vitals/Pain Pain Assessment Pain Assessment: 0-10 Pain Score: 4  Pain Location: L hip Pain Descriptors / Indicators: Discomfort, Grimacing Pain Intervention(s): Limited activity  within patient's tolerance, Monitored during session, Premedicated before session, Ice applied    Home Living Family/patient expects to be discharged to:: Private residence Living Arrangements: Spouse/significant other Available Help at Discharge: Family;Available 24 hours/day Type of Home: House Home Access: Stairs to enter Entrance Stairs-Rails: Doctor, general practice of Steps: 4   Home Layout: One level Home Equipment: Grab bars - tub/shower;Cane - single point Additional Comments: Reports RW "somewhere"    Prior Function Prior Level of Function : Independent/Modified Independent;Driving             Mobility Comments: Could ambulate in community; does not use AD; this is only fall ADLs Comments: independent adls and iadls     Extremity/Trunk Assessment   Upper Extremity Assessment Upper Extremity Assessment: RUE deficits/detail;LUE deficits/detail RUE Deficits / Details: ROM : Shoulder elevation to ~100 degrees, otherwise WNL; MMT: 5/5 throughout LUE Deficits / Details: ROM : Shoulder elevation to ~100 degrees, otherwise WNL; MMT: 5/5 throughout    Lower Extremity Assessment Lower Extremity Assessment: LLE deficits/detail;RLE deficits/detail RLE Deficits / Details: ROM: WFL ; MMT: 5/5 throughout LLE Deficits / Details: ROM: WFL (hip < R due to pain); MMT: hip flexion 1/5, hip abd 1/5, knee ext 1/5 very weak quad contraction, dorsiflexion 4/5; LLE Sensation: decreased light touch (Reports new significant decrease in sensation knee to ankle anterior and posterior leg.  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.)    Cervical / Trunk Assessment Cervical / Trunk Assessment: Normal  Communication      Cognition Arousal: Alert Behavior During Therapy: WFL for tasks assessed/performed Overall Cognitive Status: Within Functional Limits for tasks assessed                                 General Comments: Daughter  present        General Comments General comments (skin integrity, edema, etc.): VSS    Exercises General Exercises - Lower Extremity Long Arc Quad: AAROM, Left, 10 reps, Seated Heel Slides: AAROM, Left, 5 reps, Supine Hip Flexion/Marching: AAROM, 10 reps, Seated, Left Other Exercises Other Exercises: Pt eager to start moving more and get L LE stronger. Educated on [employer] techniques for seated marching and LAQ   Assessment/Plan    PT Assessment Patient needs continued PT services  PT Problem List Decreased strength;Decreased range of motion;Decreased activity tolerance;Decreased balance;Decreased mobility;Decreased knowledge of precautions;Decreased knowledge of use of DME;Pain       PT Treatment Interventions DME instruction;Therapeutic exercise;Gait training;Balance training;Stair training;Functional mobility training;Therapeutic activities;Patient/family education;Modalities;Neuromuscular re-education;Wheelchair mobility training    PT Goals (Current goals can be found in the Care Plan section)  Acute Rehab PT Goals Patient Stated Goal: return home PT Goal Formulation: With patient/family Time For Goal Achievement: 08/13/23 Potential to Achieve Goals: Good    Frequency Min 1X/week     Co-evaluation  AM-PAC PT "6 Clicks" Mobility  Outcome Measure Help needed turning from your back to your side while in a flat bed without using bedrails?: A Lot Help needed moving from lying on your back to sitting on the side of a flat bed without using bedrails?: A Lot Help needed moving to and from a bed to a chair (including a wheelchair)?: A Lot Help needed standing up from a chair using your arms (e.g., wheelchair or bedside chair)?: A Lot Help needed to walk in hospital room?: Total Help needed climbing 3-5 steps with a railing? : Total 6 Click Score: 10    End of Session Equipment Utilized During Treatment: Gait belt Activity Tolerance: Patient tolerated  treatment well Patient left: with chair alarm set;in chair;with call bell/phone within reach Nurse Communication: Mobility status PT Visit Diagnosis: Other abnormalities of gait and mobility (R26.89);Muscle weakness (generalized) (M62.81)    Time: 1610-9604 PT Time Calculation (min) (ACUTE ONLY): 36 min   Charges:   PT Evaluation $PT Eval Low Complexity: 1 Low PT Treatments $Therapeutic Activity: 8-22 mins PT General Charges $$ ACUTE PT VISIT: 1 Visit         Anise Salvo, PT Acute Rehab [facility] [phone]   Rayetta Humphrey 07/30/2023, 1:53 PM

## 2023-07-31 ENCOUNTER — Inpatient Hospital Stay (HOSPITAL_COMMUNITY): Payer: Medicare HMO

## 2023-07-31 DIAGNOSIS — S72002A Fracture of unspecified part of neck of left femur, initial encounter for closed fracture: Secondary | ICD-10-CM | POA: Diagnosis not present

## 2023-07-31 DIAGNOSIS — I951 Orthostatic hypotension: Secondary | ICD-10-CM | POA: Diagnosis not present

## 2023-07-31 LAB — CBC WITH DIFFERENTIAL/PLATELET
Abs Immature Granulocytes: 0.04 10*3/uL (ref 0.00–0.07)
Basophils Absolute: 0 10*3/uL (ref 0.0–0.1)
Basophils Relative: 0 %
Eosinophils Absolute: 0.2 10*3/uL (ref 0.0–0.5)
Eosinophils Relative: 2 %
HCT: 26.9 % — ABNORMAL LOW (ref 39.0–52.0)
Hemoglobin: 8.9 g/dL — ABNORMAL LOW (ref 13.0–17.0)
Immature Granulocytes: 1 %
Lymphocytes Relative: 28 %
Lymphs Abs: 2.2 10*3/uL (ref 0.7–4.0)
MCH: 28.8 pg (ref 26.0–34.0)
MCHC: 33.1 g/dL (ref 30.0–36.0)
MCV: 87.1 fL (ref 80.0–100.0)
Monocytes Absolute: 0.6 10*3/uL (ref 0.1–1.0)
Monocytes Relative: 8 %
Neutro Abs: 4.9 10*3/uL (ref 1.7–7.7)
Neutrophils Relative %: 61 %
Platelets: 182 10*3/uL (ref 150–400)
RBC: 3.09 MIL/uL — ABNORMAL LOW (ref 4.22–5.81)
RDW: 13.4 % (ref 11.5–15.5)
WBC: 8 10*3/uL (ref 4.0–10.5)
nRBC: 0 % (ref 0.0–0.2)

## 2023-07-31 LAB — GLUCOSE, CAPILLARY
Glucose-Capillary: 106 mg/dL — ABNORMAL HIGH (ref 70–99)
Glucose-Capillary: 114 mg/dL — ABNORMAL HIGH (ref 70–99)
Glucose-Capillary: 114 mg/dL — ABNORMAL HIGH (ref 70–99)
Glucose-Capillary: 115 mg/dL — ABNORMAL HIGH (ref 70–99)
Glucose-Capillary: 116 mg/dL — ABNORMAL HIGH (ref 70–99)
Glucose-Capillary: 168 mg/dL — ABNORMAL HIGH (ref 70–99)

## 2023-07-31 LAB — HEMOGLOBIN A1C
Hgb A1c MFr Bld: 6 % — ABNORMAL HIGH (ref 4.8–5.6)
Hgb A1c MFr Bld: 5.4 % (ref 4.8–5.6)
Mean Plasma Glucose: 126 mg/dL
Mean Plasma Glucose: 108.28 mg/dL

## 2023-07-31 LAB — BASIC METABOLIC PANEL
Anion gap: 5 (ref 5–15)
BUN: 23 mg/dL (ref 8–23)
CO2: 23 mmol/L (ref 22–32)
Calcium: 8 mg/dL — ABNORMAL LOW (ref 8.9–10.3)
Chloride: 105 mmol/L (ref 98–111)
Creatinine, Ser: 1.49 mg/dL — ABNORMAL HIGH (ref 0.61–1.24)
GFR, Estimated: 46 mL/min — ABNORMAL LOW (ref >60)
Glucose, Bld: 121 mg/dL — ABNORMAL HIGH (ref 70–99)
Potassium: 3.9 mmol/L (ref 3.5–5.1)
Sodium: 133 mmol/L — ABNORMAL LOW (ref 135–145)

## 2023-07-31 LAB — MAGNESIUM: Magnesium: 1.9 mg/dL (ref 1.7–2.4)

## 2023-07-31 MED ORDER — ASPIRIN 81 MG PO TBEC
81.0000 mg | DELAYED_RELEASE_TABLET | Freq: Every day | ORAL | Status: DC
  Administered 2023-07-31 – 2023-08-03 (×4): 81 mg via ORAL
  Filled 2023-07-31 (×4): qty 1

## 2023-07-31 MED ORDER — PANTOPRAZOLE SODIUM 40 MG PO TBEC
40.0000 mg | DELAYED_RELEASE_TABLET | Freq: Every day | ORAL | Status: DC
  Administered 2023-08-01 – 2023-08-03 (×3): 40 mg via ORAL
  Filled 2023-07-31 (×3): qty 1

## 2023-07-31 NOTE — TOC CAGE-AID Note (Signed)
Transition of Care [facility]) - CAGE-AID Screening   Patient Details  Name: EARNESTINE ZUKER MRN: [id number] Date of Birth: 11-07-1941  Transition of Care [facility]) CM/SW Contact:    Janora Norlander, RN Phone Number: [phone] 07/31/2023, 7:51 AM   Clinical Narrative: Pt here after sustaining a left hip fracture.  Pt states that he does not drink alcohol or do drugs.  No resources needed.  Screening complete.   CAGE-AID Screening:    Have You Ever Felt You Ought to Cut Down on Your Drinking or Drug Use?: No Have People Annoyed You By Critizing Your Drinking Or Drug Use?: No Have You Felt Bad Or Guilty About Your Drinking Or Drug Use?: No Have You Ever Had a Drink or Used Drugs First Thing In The Morning to Steady Your Nerves or to Get Rid of a Hangover?: No CAGE-AID Score: 0  Substance Abuse Education Offered: No

## 2023-07-31 NOTE — Evaluation (Signed)
Occupational Therapy Evaluation Patient Details Name: CAMMRON ALLSOPP MRN: [id number] DOB: 1941-06-10 Today's Date: 07/31/2023   History of Present Illness Pt is 82 yo male admitted on 07/29/23 after fall with closed L hip periprosthetic fracture.  Orthopedics consulted and reports stable fracture to be managed nonoperatively and with WBAT.  Pt with hx including DM2, CKD, anemia, and bil THA ~20 years ago.   Clinical Impression   Pt s/p above diagnosis. Pt c/o pain with movement, 8/10, no pain at rest. Pt family present during session. Pt PLOF independent, lives at home with wife who can assist as needed. Pt currently requires assistance with LB ADLs and transfers due to pain in LLE. Once upright, Pt CGA for step pivot with RW. Pt would [facility] from [facility] for home to increase safety and decrease caregiver burden for assisting to toilet. HHOT recommended to maximize safety and independence in home, will follow acutely.        If plan is discharge home, recommend the following: A little help with bathing/dressing/bathroom;A little help with walking and/or transfers;Assistance with cooking/housework;Assist for transportation;Help with stairs or ramp for entrance    Functional Status Assessment  Patient has had a recent decline in their functional status and demonstrates the ability to make significant improvements in function in a reasonable and predictable amount of time.  Equipment Recommendations  BSC/3in1    Recommendations for Other Services       Precautions / Restrictions Precautions Precautions: Fall Precaution Comments: Orthostatic hypotension Restrictions Weight Bearing Restrictions: No LLE Weight Bearing: Weight bearing as tolerated      Mobility Bed Mobility Overal bed mobility: Needs Assistance Bed Mobility: Supine to Sit     Supine to sit: Mod assist, Used rails, HOB elevated     General bed mobility comments: assist LLE and to power sitting up     Transfers Overall transfer level: Needs assistance Equipment used: Rolling walker (2 wheels) Transfers: Sit to/from Stand, Bed to chair/wheelchair/BSC Sit to Stand: Min assist, From elevated surface     Step pivot transfers: Contact guard assist     General transfer comment: min A from elevated surface, increased assistance from standard height. Pt CGA for step pivot to recliner.      Balance Overall balance assessment: Needs assistance Sitting-balance support: No upper extremity supported Sitting balance-Leahy Scale: Good Sitting balance - Comments: sitting EOB   Standing balance support: Bilateral upper extremity supported, Reliant on assistive device for balance Standing balance-Leahy Scale: Poor Standing balance comment: RW and CGA                           ADL either performed or assessed with clinical judgement   ADL Overall ADL's : Needs assistance/impaired Eating/Feeding: Independent   Grooming: Set up;Sitting   Upper Body Bathing: Set up;Sitting   Lower Body Bathing: Moderate assistance;Sitting/lateral leans   Upper Body Dressing : Set up;Sitting   Lower Body Dressing: Moderate assistance;Sitting/lateral leans   Toilet Transfer: Minimal assistance;Rolling walker (2 wheels);Comfort height toilet   Toileting- Clothing Manipulation and Hygiene: Minimal assistance;Sitting/lateral lean       Functional mobility during ADLs: Contact guard assist General ADL Comments: Pt requires significant assistance for LB ADLs, able to stand min A from elevated surfaces, increased assistance from standard height. Pt BUEs good for UB ADLs.     Vision Baseline Vision/History: 1 Wears glasses;5 Retinopathy Ability to See in Adequate Light: 0 Adequate Patient Visual Report: No change from baseline  Perception         Praxis         Pertinent Vitals/Pain Pain Assessment Pain Assessment: 0-10 Pain Score: 7  Pain Location: L hip when moving, 0/10  when lying down Pain Descriptors / Indicators: Discomfort, Grimacing Pain Intervention(s): Monitored during session     Extremity/Trunk Assessment Upper Extremity Assessment Upper Extremity Assessment: Overall WFL for tasks assessed           Communication Communication Communication: No apparent difficulties   Cognition Arousal: Alert Behavior During Therapy: WFL for tasks assessed/performed Overall Cognitive Status: History of cognitive impairments - at baseline                                 General Comments: A/Ox3, not oriented to time     General Comments       Exercises     Shoulder Instructions      Home Living Family/patient expects to be discharged to:: Private residence Living Arrangements: Spouse/significant other Available Help at Discharge: Family;Available 24 hours/day Type of Home: House Home Access: Stairs to enter [employer] of Steps: 4 Entrance Stairs-Rails: Right;Left Home Layout: One level     Bathroom Shower/Tub: Chief Strategy Officer: Handicapped height     Home Equipment: Grab bars - tub/shower;Cane - single Librarian, academic (2 wheels)   Additional Comments: Lives with wife who can assist 24/7      Prior Functioning/Environment Prior Level of Function : Independent/Modified Independent;Driving             Mobility Comments: Could ambulate in community; does not use AD; this is only fall ADLs Comments: independent adls and iadls        OT Problem List: Decreased strength;Decreased range of motion;Decreased activity tolerance;Impaired balance (sitting and/or standing);Pain;Impaired sensation      OT Treatment/Interventions: Self-care/ADL training;Therapeutic exercise;Energy conservation;DME and/or AE instruction;Therapeutic activities;Patient/family education    OT Goals(Current goals can be found in the care plan section) Acute Rehab OT Goals Patient Stated Goal: To return to  PLOF OT Goal Formulation: With patient/family Time For Goal Achievement: 08/14/23 Potential to Achieve Goals: Good  OT Frequency: Min 1X/week    Co-evaluation              AM-PAC OT "6 Clicks" Daily Activity     Outcome Measure Help from another person eating meals?: None Help from another person taking care of personal grooming?: A Little Help from another person toileting, which includes using toliet, bedpan, or urinal?: A Little Help from another person bathing (including washing, rinsing, drying)?: A Lot Help from another person to put on and taking off regular upper body clothing?: A Little Help from another person to put on and taking off regular lower body clothing?: A Lot 6 Click Score: 17   End of Session Equipment Utilized During Treatment: Gait belt;Rolling walker (2 wheels) Nurse Communication: Other (comment);Mobility status (Pt and family asking about why LLE is numb)  Activity Tolerance: Patient tolerated treatment well Patient left: in chair;with call bell/phone within reach;with family/visitor present  OT Visit Diagnosis: Unsteadiness on feet (R26.81);Other abnormalities of gait and mobility (R26.89);Muscle weakness (generalized) (M62.81);Pain Pain - Right/Left: Left Pain - part of body: Leg                Time: 7829-5621 OT Time Calculation (min): 25 min Charges:  OT General Charges $OT Visit: 1 Visit OT Evaluation $OT Eval Low Complexity:  1 Low OT Treatments $Self Care/Home Management : 8-22 mins  Vander, OTR/L   Alexis Goodell 07/31/2023, 3:36 PM

## 2023-07-31 NOTE — TOC Progression Note (Signed)
Transition of Care [facility]) - Progression Note    Patient Details  Name: Kevin Suarez MRN: [id number] Date of Birth: 05-04-1941  [facility]) CM/SW Contact  Epifanio Lesches, RN Phone Number: 07/31/2023, 2:28 PM  Clinical Narrative:     NCM spoke to pt and daughterMarylene Land) @ bedside regarding transitional care needs. Pt declined SNF placement . Agreeable to home health services. Pt without provider preference. Referral made with [facility] and accepted. Referral made for RW with [employer]. Equipment will be delivered to bedside prior to discharge.  TOC team following and will continue assisting with needs....  Expected Discharge Plan: Home w Home Health Services Barriers to Discharge: Continued Medical Work up  Expected Discharge Plan and Services In-house Referral: Clinical Social Work Discharge Planning Services: CM Consult Post Acute Care Choice: Home Health Living arrangements for the past 2 months: Single Family Home                 DME Arranged: Bedside commode DME Agency: [employer] Date DME Agency Contacted: 07/31/23 Time DME Agency Contacted: 1313 Representative spoke with at DME Agency: Vaughan Basta HH Arranged: PT, OT HH Agency: [facility] Date [facility] Agency Contacted: 07/31/23 Time HH Agency Contacted: 1314 Representative spoke with at [facility] Agency: Amy   Social Determinants of Health (SDOH) Interventions SDOH Screenings   Food Insecurity: No Food Insecurity (07/29/2023)  Housing: Low Risk  (07/29/2023)  Transportation Needs: No Transportation Needs (07/29/2023)  Utilities: Not At Risk (07/29/2023)  Tobacco Use: Medium Risk (07/29/2023)    Readmission Risk Interventions     No data to display

## 2023-07-31 NOTE — Progress Notes (Signed)
Physical Therapy Treatment Patient Details Name: Kevin Suarez MRN: [id number] DOB: May 05, 1941 Today's Date: 07/31/2023   History of Present Illness Pt is 82 yo male admitted on 07/29/23 after fall with closed L hip periprosthetic fracture.  Orthopedics consulted and reports stable fracture to be managed nonoperatively and with WBAT.  Pt with hx including DM2, CKD, anemia, and bil THA ~20 years ago.    PT Comments  Continuing work on functional mobility and activity tolerance;  session focused on functional transfers and initial gait training; Better able to use RW to unweigh painful LLE in stance, and pt was able to walk approx 5 ft forward with careful monitoring for syncopal symptoms, min assist to steady and advance LLE, and second person assist for chair follow for safety; Pt did experience a BP drop and presyncopal symptoms at approx 3-4 minutes of upright gait activities;   Hopeful for good progress with mobility, activity tolerance as his medical status and BP response to upright activity improves; Today, PT rec is still for continued inpatient follow up therapy, <3 hours/day, to maximize independence and safety with mobility and ADLs and allow for safer dc home; Will continue to follow, and update dc recs as able -- pt and family very much want to dc home, and it sounds like they are declining SNF;   Please see other PT note for details re: serial BPs during session.   If plan is discharge home, recommend the following: A lot of help with walking and/or transfers;A lot of help with bathing/dressing/bathroom;Assistance with cooking/housework;Help with stairs or ramp for entrance   Can travel by private vehicle     No  Equipment Recommendations  Rolling walker (2 wheels);Wheelchair cushion (measurements PT);Wheelchair (measurements PT);BSC/3in1    Recommendations for Other Services       Precautions / Restrictions Precautions Precautions: Fall Precaution Comments: Orthostatic  hypotension Restrictions Weight Bearing Restrictions: No LLE Weight Bearing: Weight bearing as tolerated     Mobility  Bed Mobility Overal bed mobility: Needs Assistance Bed Mobility: Supine to Sit     Supine to sit: Mod assist     General bed mobility comments: Assist for L LE, scooting bottom with pad, and to lift trunk.  Cues for sequencign but pt tolerating well and assiting as pain allows    Transfers Overall transfer level: Needs assistance Equipment used: Rolling walker (2 wheels) Transfers: Sit to/from Stand Sit to Stand: Mod assist, From elevated surface           General transfer comment: Mod assist to rise; cues for positioning for LLE comfort with transitions    Ambulation/Gait Ambulation/Gait assistance: Min assist, +2 safety/equipment Gait Distance (Feet): 5 Feet (5 ft forward, and 2 feet sideways to the right) Assistive device: Rolling walker (2 wheels) Gait Pattern/deviations: Step-to pattern       General Gait Details: Cues for sequence, and to bear down into RW to unweight painful LLE in stance   Stairs             Wheelchair Mobility     Tilt Bed    Modified Rankin (Stroke Patients Only)       Balance     Sitting balance-Leahy Scale: Good Sitting balance - Comments: sitting EOB     Standing balance-Leahy Scale: Poor Standing balance comment: RW                            Cognition Arousal: Alert Behavior During  Therapy: WFL for tasks assessed/performed Overall Cognitive Status: Within Functional Limits for tasks assessed                                          Exercises      General Comments General comments (skin integrity, edema, etc.): See other PT note of this date      Pertinent Vitals/Pain Pain Assessment Pain Assessment: 0-10 Pain Score: 7  Pain Location: L hip sitting EOB; reports less painful in standing Pain Descriptors / Indicators: Discomfort, Grimacing Pain  Intervention(s): Monitored during session    Home Living Family/patient expects to be discharged to:: Private residence Living Arrangements: Spouse/significant other Available Help at Discharge: Family;Available 24 hours/day Type of Home: House Home Access: Stairs to enter Entrance Stairs-Rails: Doctor, general practice of Steps: 4   Home Layout: One level Home Equipment: Grab bars - tub/shower;Cane - single Librarian, academic (2 wheels) Additional Comments: Lives with wife who can assist 24/7    Prior Function            PT Goals (current goals can now be found in the care plan section) Acute Rehab PT Goals Patient Stated Goal: return home PT Goal Formulation: With patient/family Time For Goal Achievement: 08/13/23 Potential to Achieve Goals: Good Progress towards PT goals: Progressing toward goals    Frequency    Min 1X/week      PT Plan      Co-evaluation              AM-PAC PT "6 Clicks" Mobility   Outcome Measure  Help needed turning from your back to your side while in a flat bed without using bedrails?: A Lot Help needed moving from lying on your back to sitting on the side of a flat bed without using bedrails?: A Lot Help needed moving to and from a bed to a chair (including a wheelchair)?: A Lot Help needed standing up from a chair using your arms (e.g., wheelchair or bedside chair)?: A Lot Help needed to walk in hospital room?: Total Help needed climbing 3-5 steps with a railing? : Total 6 Click Score: 10    End of Session Equipment Utilized During Treatment: Gait belt Activity Tolerance: Treatment limited secondary to medical complications (Comment) (orthostatic hypotension) Patient left: in chair;with call bell/phone within reach;with chair alarm set Nurse Communication: Mobility status (BP drops in standing) PT Visit Diagnosis: Other abnormalities of gait and mobility (R26.89);Muscle weakness (generalized) (M62.81)     Time:  4098-1191 PT Time Calculation (min) (ACUTE ONLY): 30 min  Charges:    $Gait Training: 8-22 mins $Therapeutic Activity: 8-22 mins PT General Charges $$ ACUTE PT VISIT: 1 Visit                     Van Clines, PT  Acute Rehabilitation Services Office [phone] Secure Chat welcomed    [name] 07/31/2023, 4:44 PM

## 2023-07-31 NOTE — Progress Notes (Signed)
Physical Therapy Note  (Full PT treatment note to follow)    07/31/23 0930  Vital Signs  Patient Position (if appropriate) Orthostatic Vitals  Orthostatic Lying   BP- Lying 99/61 (MAP 72)  Pulse- Lying 78  Orthostatic Sitting  BP- Sitting (!) 75/63 (MAP 70)  Pulse- Sitting 91  (Reports no syncopal symptoms; proceeded with close monitor)  Orthostatic Standing at 0 minutes  BP- Standing at 0 minutes (!) 78/53 (MAP 63)  Pulse- Standing at 0 minutes 110 (Reports feeling weak)  Orthostatic Standing at 3 minutes  BP- Standing at 3 minutes   ?/? (Monitor unable to obtain; pt able to remain standing)  Pulse- Standing at 3 minutes 98 (Reports onset of dizziness; notable pallor)       07/31/23 0940 07/31/23 0945  Orthostatic Sitting  BP- Sitting (!) 65/54 (MAP 60) 93/57 (MAP 68)  Pulse- Sitting 84 (Reclined, feet up; Reports dizziness/weakness stopped; still opted to recline seatback further) 83 (Reclined further; End of session)   Van Clines, PT  Acute Rehabilitation Services Office [phone] Secure Chat welcomed

## 2023-07-31 NOTE — Progress Notes (Signed)
PROGRESS NOTE  [name]:[id number] DOB: 03-07-1941 DOA: 07/29/2023 PCP: Kevin Garbe, MD  HPI/Recap of past 24 hours: Kevin Suarez is a 82 y.o. male with medical history significant of DM2, CKD 3b, anemia, presented with mechanical fall, did not hit head or LOC. Denies any CP or SOB prior to fall. In the ED, imaging showed L periprosthetic hip fx and orthopedics was consulted. Pt admitted for further management.    Today, pt denies any new complaints. Reports some pain upon movement.   Assessment/Plan: Principal Problem:   Closed left hip fracture (HCC) Active Problems:   Benign prostatic hyperplasia   Diabetes mellitus (HCC)   Anemia   BPH (benign prostatic hyperplasia)   CKD (chronic kidney disease) stage 3, GFR 30-59 ml/min (HCC)    Left periprosthetic hip fx Mechanical fall possibly from orthostatic hypotension Orthopedic consulted, recommended non-operative management with WBAT LLE Pain management PT/OT Fall precautions Outpatient follow up  Orthostatic hypotension Noted + on 9/17 Soft BP Continue IVF, may need ?midodrine if no improvement with IVF PT/OT  Hypomagnesemia Replace prn  Normocytic anemia Iron def anemia Unknown Hgb baseline, dropping, possibly from hemodilution Anemia panel showed iron 37, sats 14 Iron supplementation FOBT pending collection Daily CBC  HTN BP soft Hold home losartan Continue IVF  DM type 2 Last A1c 7 SSI, accuchecks, hypoglycemic protocol  ??AKI on CKD stage IIIa Baseline Cr unknown Continue IVF, hold losartan Renal ultrasound pending Daily BMP  BPH Continue finasteride, flomax      Malnutrition Type:  Nutrition Problem: Increased nutrient needs Etiology: hip fracture   Malnutrition Characteristics:  Signs/Symptoms: estimated needs   Nutrition Interventions:  Interventions: Ensure Enlive (each supplement provides 350kcal and 20 grams of protein), MVI    Estimated body mass  index is 28.98 kg/m as calculated from the following:   Height as of this encounter: 5\' 10"  (1.778 m).   Weight as of this encounter: 91.6 kg.     Code Status: Full  Family Communication: None at bedside  Disposition Plan: Status is: Inpatient Remains inpatient appropriate because: Level of care      Consultants: Orthopedics  Procedures: None  Antimicrobials: None  DVT prophylaxis:  Lovenox   Objective: Vitals:   07/30/23 2132 07/31/23 0352 07/31/23 0729 07/31/23 1446  BP: (!) 114/56 (!) 94/54 (!) 97/51 (!) 105/54  Pulse: 82 81 75 86  Resp: 18 18 18 17   Temp: 98.3 F (36.8 C) 98.4 F (36.9 C) 98.6 F (37 C) 98.4 F (36.9 C)  TempSrc: Oral Oral Oral Oral  SpO2: 95% 96% 96% 97%  Weight:      Height:        Intake/Output Summary (Last 24 hours) at 07/31/2023 1455 Last data filed at 07/31/2023 0348 Gross per 24 hour  Intake --  Output 500 ml  Net -500 ml   Filed Weights   07/29/23 2213  Weight: 91.6 kg    Exam: General: NAD  Cardiovascular: S1, S2 present Respiratory: CTAB Abdomen: Soft, nontender, nondistended, bowel sounds present Musculoskeletal: No bilateral pedal edema noted Skin: Normal Psychiatry: Normal mood     Data Reviewed: CBC: Recent Labs  Lab 07/29/23 1520 07/30/23 0355 07/31/23 0410  WBC 12.2* 8.6 8.0  NEUTROABS 9.5*  --  4.9  HGB 10.7* 9.3* 8.9*  HCT 32.9* 28.3* 26.9*  MCV 84.6 84.0 87.1  PLT 224 197 182   Basic Metabolic Panel: Recent Labs  Lab 07/29/23 1520 07/30/23 0355 07/31/23 0410  NA 137  137 133*  K 4.0 4.2 3.9  CL 107 106 105  CO2 20* 23 23  GLUCOSE 103* 113* 121*  BUN 25* 24* 23  CREATININE 1.81* 1.62* 1.49*  CALCIUM 8.9 8.5* 8.0*  MG  --  1.6* 1.9  PHOS  --  3.6  --    GFR: Estimated Creatinine Clearance: 42.7 mL/min (A) (by C-G formula based on SCr of 1.49 mg/dL (H)). Liver Function Tests: Recent Labs  Lab 07/29/23 1520 07/30/23 0355  AST 17 15  ALT 13 12  ALKPHOS 46 49  BILITOT 0.7 0.7   PROT 6.5 5.6*  ALBUMIN 3.6 3.0*   No results for input(s): "LIPASE", "AMYLASE" in the last 168 hours. No results for input(s): "AMMONIA" in the last 168 hours. Coagulation Profile: No results for input(s): "INR", "PROTIME" in the last 168 hours. Cardiac Enzymes: Recent Labs  Lab 07/30/23 0355  CKTOTAL 39*   BNP (last 3 results) No results for input(s): "PROBNP" in the last 8760 hours. HbA1C: Recent Labs    07/30/23 0355 07/31/23 0410  HGBA1C 6.0* 5.4   CBG: Recent Labs  Lab 07/30/23 2159 07/31/23 0037 07/31/23 0350 07/31/23 0750 07/31/23 1142  GLUCAP 133* 114* 116* 115* 114*   Lipid Profile: No results for input(s): "CHOL", "HDL", "LDLCALC", "TRIG", "CHOLHDL", "LDLDIRECT" in the last 72 hours. Thyroid Function Tests: Recent Labs    07/30/23 0355  TSH 1.838   Anemia Panel: Recent Labs    07/30/23 0355  VITAMINB12 452  FOLATE 15.1  FERRITIN 37  TIBC 260  IRON 37*  RETICCTPCT 1.4   Urine analysis:    Component Value Date/Time   COLORURINE YELLOW 07/30/2023 0549   APPEARANCEUR CLEAR 07/30/2023 0549   LABSPEC 1.025 07/30/2023 0549   PHURINE 5.5 07/30/2023 0549   GLUCOSEU NEGATIVE 07/30/2023 0549   HGBUR NEGATIVE 07/30/2023 0549   BILIRUBINUR NEGATIVE 07/30/2023 0549   KETONESUR NEGATIVE 07/30/2023 0549   PROTEINUR NEGATIVE 07/30/2023 0549   UROBILINOGEN 1.0 09/21/2011 0146   NITRITE NEGATIVE 07/30/2023 0549   LEUKOCYTESUR NEGATIVE 07/30/2023 0549   Sepsis Labs: @LABRCNTIP (procalcitonin:4,lacticidven:4)  ) Recent Results (from the past 240 hour(s))  Surgical pcr screen     Status: Abnormal   Collection Time: 07/29/23 11:21 PM   Specimen: Nasal Mucosa; Nasal Swab  Result Value Ref Range Status   MRSA, PCR NEGATIVE NEGATIVE Final   Staphylococcus aureus POSITIVE (A) NEGATIVE Final    Comment: (NOTE) The Xpert SA Assay (FDA approved for NASAL specimens in patients 68 years of age and older), is one component of a comprehensive surveillance  program. It is not intended to diagnose infection nor to guide or monitor treatment. Performed at [facility], [address], [place], [place] [address]       Studies: No results found.  Scheduled Meds:  Chlorhexidine Gluconate Cloth  6 each Topical Q0600   enoxaparin (LOVENOX) injection  40 mg Subcutaneous Q24H   feeding supplement  237 mL Oral Q24H   ferrous sulfate  325 mg Oral Q breakfast   finasteride  5 mg Oral Daily   insulin aspart  0-9 Units Subcutaneous Q4H   multivitamin  1 tablet Oral QHS   mupirocin ointment  1 Application Nasal BID   tamsulosin  0.4 mg Oral QHS    Continuous Infusions:  sodium chloride 100 mL/hr at 07/30/23 1418   methocarbamol (ROBAXIN) IV       LOS: 2 days     Briant Cedar, MD [facility]  If  7PM-7AM, please contact night-coverage [email or web address] 07/31/2023, 2:55 PM

## 2023-07-31 NOTE — TOC Progression Note (Addendum)
Transition of Care [facility]) - Progression Note    Patient Details  Name: Kevin Suarez MRN: [id number] Date of Birth: January 01, 1941  Transition of Care [facility]) CM/SW Contact  Lorri Frederick, LCSW Phone Number: 07/31/2023, 10:08 AM  Clinical Narrative:  Bed offers provided to pt and wife.  They will review, want to discuss with their daughter, who will be here shortly.    1145: CSW spoke with pt, wife, daughter [name].  They have decided to decline SNF, would like to DC home with [facility].  Pt has walker at home and a lift chair.  Will need 3n1.  RNCM notified.  Expected Discharge Plan: Skilled Nursing Facility Barriers to Discharge: Continued Medical Work up, SNF Pending bed offer  Expected Discharge Plan and Services In-house Referral: Clinical Social Work   Post Acute Care Choice: Skilled Nursing Facility Living arrangements for the past 2 months: Single Family Home                                       Social Determinants of Health (SDOH) Interventions SDOH Screenings   Food Insecurity: No Food Insecurity (07/29/2023)  Housing: Low Risk  (07/29/2023)  Transportation Needs: No Transportation Needs (07/29/2023)  Utilities: Not At Risk (07/29/2023)  Tobacco Use: Medium Risk (07/29/2023)    Readmission Risk Interventions     No data to display

## 2023-07-31 NOTE — Care Management Important Message (Signed)
Important Message  Patient Details  Name: GORDON TRISLER MRN: [id number] Date of Birth: 1941-02-27   Medicare Important Message Given:  Yes     Sherilyn Banker 07/31/2023, 12:34 PM

## 2023-07-31 NOTE — Plan of Care (Signed)
  Problem: Education: Goal: Ability to describe self-care measures that may prevent or decrease complications (Diabetes Survival Skills Education) will improve Outcome: Progressing Goal: Individualized Educational Video(s) Outcome: Progressing   Problem: Coping: Goal: Ability to adjust to condition or change in health will improve Outcome: Progressing   Problem: Fluid Volume: Goal: Ability to maintain a balanced intake and output will improve Outcome: Progressing   Problem: Metabolic: Goal: Ability to maintain appropriate glucose levels will improve Outcome: Progressing   Problem: Nutritional: Goal: Maintenance of adequate nutrition will improve Outcome: Progressing Goal: Progress toward achieving an optimal weight will improve Outcome: Progressing   Problem: Education: Goal: Knowledge of General Education information will improve Description: Including pain rating scale, medication(s)/side effects and non-pharmacologic comfort measures Outcome: Progressing   Problem: Health Behavior/Discharge Planning: Goal: Ability to manage health-related needs will improve Outcome: Progressing   Problem: Activity: Goal: Risk for activity intolerance will decrease Outcome: Progressing   Problem: Nutrition: Goal: Adequate nutrition will be maintained Outcome: Progressing   Problem: Pain Managment: Goal: General experience of comfort will improve Outcome: Progressing   Problem: Safety: Goal: Ability to remain free from injury will improve Outcome: Progressing

## 2023-08-01 DIAGNOSIS — N401 Enlarged prostate with lower urinary tract symptoms: Secondary | ICD-10-CM | POA: Diagnosis not present

## 2023-08-01 DIAGNOSIS — S72002D Fracture of unspecified part of neck of left femur, subsequent encounter for closed fracture with routine healing: Secondary | ICD-10-CM | POA: Diagnosis not present

## 2023-08-01 LAB — GLUCOSE, CAPILLARY
Glucose-Capillary: 101 mg/dL — ABNORMAL HIGH (ref 70–99)
Glucose-Capillary: 107 mg/dL — ABNORMAL HIGH (ref 70–99)
Glucose-Capillary: 109 mg/dL — ABNORMAL HIGH (ref 70–99)
Glucose-Capillary: 125 mg/dL — ABNORMAL HIGH (ref 70–99)
Glucose-Capillary: 94 mg/dL (ref 70–99)
Glucose-Capillary: 95 mg/dL (ref 70–99)

## 2023-08-01 NOTE — Progress Notes (Signed)
Anesthesiology:   Asked to eval patient with residual numbness s/p femoral nerve block Sunday 7/15 for hip fracture analgesia.    Pt relates he has been pain free since receiving the nerve block, very appreciative of the pain relief. He was concerned yesterday, with persistent L thigh numbness, but this has completely resolved today. He has strong dorsiflexion/plantar flexion in foot, can ambulate, and has full sensation in his thigh.  Thank you for allowing [place] to participate in the care of Mr. Groot. His femoral nerve block provided pain relief, and has now completely resolved.   Sandford Craze, MD

## 2023-08-01 NOTE — Progress Notes (Signed)
[facility]                                                                               Kevin Suarez, is a 82 y.o. male, DOB - 1940-11-28, WUJ:[id number] Admit date - 07/29/2023    Outpatient Primary MD for the patient is Tisovec, Adelfa Koh, MD  LOS - 3  days    Brief summary   Kevin Suarez is a 82 y.o. male with medical history significant of DM2, CKD 3b, anemia, presented with mechanical fall, did not hit head or LOC. Denies any CP or SOB prior to fall. In the ED, imaging showed L periprosthetic hip fx and orthopedics was consulted. Pt admitted for further management.     Assessment & Plan    Assessment and Plan:     Left periprosthetic hip fx Mechanical fall possibly from orthostatic hypotension Orthopedic consulted, recommended non-operative management with WBAT LLE Pain management PT/OT Fall precautions Outpatient follow up   Orthostatic hypotension Noted + on 9/17 Recommend checking repeat orthostatic vital signs with physical therapy today.   Hypomagnesemia Replaced   Normocytic anemia Iron def anemia FOBT is pending. Transfuse to keep hemoglobin greater than 7 currently around 8.5.     HTN Well-controlled.  Continue to monitor   DM type 2 A1c IS 7.  CBG (last 3)  Recent Labs    08/01/23 0736 08/01/23 1058 08/01/23 1604  GLUCAP 101* 107* 125*      ??AKI on CKD stage IIIa Unknown basal creatinine Creatinine 1.35 today Renal ultrasound unremarkable BPH Continue finasteride, flomax     Malnutrition Type:  Nutrition Problem: Increased nutrient needs Etiology: hip fracture   Malnutrition Characteristics:  Signs/Symptoms: estimated needs   Nutrition Interventions:  Interventions: Ensure Enlive (each supplement provides 350kcal and 20 grams of protein), MVI  Estimated body mass index is 28.98 kg/m as calculated from the following:   Height as of this encounter: 5\' 10"  (1.778 m).   Weight as of this encounter:  91.6 kg.  Code Status: full code.  DVT Prophylaxis:  enoxaparin (LOVENOX) injection 40 mg Start: 07/30/23 1430 SCDs Start: 07/29/23 2304   Level of Care: Level of care: Telemetry Medical Family Communication: none at bedside.   Disposition Plan:     Remains inpatient appropriate:  SNF.   Procedures:   NONE.  Consultants:   Orthopedics.   Antimicrobials:   Anti-infectives (From admission, onward)    None        Medications  Scheduled Meds:  aspirin EC  81 mg Oral Daily   Chlorhexidine Gluconate Cloth  6 each Topical Q0600   enoxaparin (LOVENOX) injection  40 mg Subcutaneous Q24H   feeding supplement  237 mL Oral Q24H   ferrous sulfate  325 mg Oral Q breakfast   finasteride  5 mg Oral Daily   insulin aspart  0-9 Units Subcutaneous Q4H   multivitamin  1 tablet Oral QHS   mupirocin ointment  1 Application Nasal BID   pantoprazole  40 mg Oral Daily   tamsulosin  0.4 mg Oral QHS   Continuous Infusions:  sodium chloride 100 mL/hr at 07/31/23 2052   methocarbamol (ROBAXIN) IV  PRN Meds:.HYDROcodone-acetaminophen, methocarbamol **OR** methocarbamol (ROBAXIN) IV, ondansetron (ZOFRAN) IV    Subjective:   Kevin Suarez was seen and examined today.  Pain and sensation coming back to the left lower extremity.   Objective:   Vitals:   07/31/23 1946 08/01/23 0433 08/01/23 0737 08/01/23 1252  BP: (!) 128/57 112/73 127/61 (!) 108/53  Pulse: (!) 105 83 77 75  Resp: 18 18 18 18   Temp: 100.1 F (37.8 C)  99 F (37.2 C) 98.8 F (37.1 C)  TempSrc: Oral  Oral Oral  SpO2: 97% 96%  98%  Weight:      Height:       No intake or output data in the 24 hours ending 08/01/23 1839 Filed Weights   07/29/23 2213  Weight: 91.6 kg     Exam General: Alert and oriented x 3, NAD Cardiovascular: S1 S2 auscultated, no murmurs, RRR Respiratory: Clear to auscultation bilaterally, no wheezing, rales or rhonchi Gastrointestinal: Soft, nontender, nondistended, + bowel  sounds Ext: no pedal edema bilaterally Neuro: AAOx3, Cr N's II- XII.  Skin: No rashes Psych: Normal affect and demeanor, alert and oriented x3    Data Reviewed:  I have personally reviewed following labs and imaging studies   CBC Lab Results  Component Value Date   WBC 8.4 08/01/2023   RBC 3.08 (L) 08/01/2023   HGB 8.5 (L) 08/01/2023   HCT 26.5 (L) 08/01/2023   MCV 86.0 08/01/2023   MCH 27.6 08/01/2023   PLT 189 08/01/2023   MCHC 32.1 08/01/2023   RDW 13.3 08/01/2023   LYMPHSABS 2.1 08/01/2023   MONOABS 0.7 08/01/2023   EOSABS 0.1 08/01/2023   BASOSABS 0.0 08/01/2023     Last metabolic panel Lab Results  Component Value Date   NA 137 08/01/2023   K 4.3 08/01/2023   CL 109 08/01/2023   CO2 22 08/01/2023   BUN 23 08/01/2023   CREATININE 1.35 (H) 08/01/2023   GLUCOSE 110 (H) 08/01/2023   GFRNONAA 52 (L) 08/01/2023   GFRAA 55 (L) 07/14/2020   CALCIUM 8.2 (L) 08/01/2023   PHOS 3.6 07/30/2023   PROT 5.6 (L) 07/30/2023   ALBUMIN 3.0 (L) 07/30/2023   BILITOT 0.7 07/30/2023   ALKPHOS 49 07/30/2023   AST 15 07/30/2023   ALT 12 07/30/2023   ANIONGAP 6 08/01/2023    CBG (last 3)  Recent Labs    08/01/23 0736 08/01/23 1058 08/01/23 1604  GLUCAP 101* 107* 125*      Coagulation Profile: No results for input(s): "INR", "PROTIME" in the last 168 hours.   Radiology Studies: US RENAL  Result Date: 07/31/2023 CLINICAL DATA:  [id number] AKI (acute kidney injury) (HCC) [id number] EXAM: RENAL / URINARY TRACT ULTRASOUND COMPLETE COMPARISON:  CT 10/19/2015 FINDINGS: Right Kidney: Renal measurements: 10.7 x 4.6 x 4.7 cm = volume: 120 mL. Echogenicity within normal limits. No mass or hydronephrosis visualized. Left Kidney: Renal measurements: 10.3 x 5.1 x 4.7 cm = volume: 131 mL. Echogenicity within normal limits. No mass or hydronephrosis visualized. Bladder: Appears normal for degree of bladder distention. Other: None. IMPRESSION: Unremarkable renal ultrasound. Electronically  Signed   By: Duanne Guess D.O.   On: 07/31/2023 20:09       Kathlen Mody M.D. [facility] 08/01/2023, 6:39 PM  Available via Epic secure chat 7am-7pm After 7 pm, please refer to night coverage provider listed on amion.

## 2023-08-01 NOTE — Plan of Care (Signed)
Problem: Education: Goal: Ability to describe self-care measures that may prevent or decrease complications (Diabetes Survival Skills Education) will improve Outcome: Progressing Goal: Individualized Educational Video(s) Outcome: Progressing   Problem: Coping: Goal: Ability to adjust to condition or change in health will improve Outcome: Progressing   Problem: Fluid Volume: Goal: Ability to maintain a balanced intake and output will improve Outcome: Progressing   Problem: Metabolic: Goal: Ability to maintain appropriate glucose levels will improve Outcome: Progressing   Problem: Nutritional: Goal: Maintenance of adequate nutrition will improve Outcome: Progressing Goal: Progress toward achieving an optimal weight will improve Outcome: Progressing   Problem: Education: Goal: Knowledge of General Education information will improve Description: Including pain rating scale, medication(s)/side effects and non-pharmacologic comfort measures Outcome: Progressing   Problem: Health Behavior/Discharge Planning: Goal: Ability to manage health-related needs will improve Outcome: Progressing   Problem: Activity: Goal: Risk for activity intolerance will decrease Outcome: Progressing   Problem: Nutrition: Goal: Adequate nutrition will be maintained Outcome: Progressing   Problem: Pain Managment: Goal: General experience of comfort will improve Outcome: Progressing   Problem: Safety: Goal: Ability to remain free from injury will improve Outcome: Progressing

## 2023-08-01 NOTE — Progress Notes (Signed)
Physical Therapy Treatment Patient Details Name: Kevin Suarez MRN: [id number] DOB: 29-Nov-1940 Today's Date: 08/01/2023   History of Present Illness Pt is 82 yo male admitted on 07/29/23 after fall with closed L hip periprosthetic fracture.  Orthopedics consulted and reports stable fracture to be managed nonoperatively and with WBAT.  Pt with hx including DM2, CKD, anemia, and bil THA ~20 years ago.    PT Comments  Continuing work on functional mobility and activity tolerance;  Session focused on progressing gait and activity tolerance, with good progress noted; PT was able to walk further without onset of "feeling weak" and "dizzy" (these were the two symptoms that occurred with BP drops yesterday); Pt reported his sensation L thigh has returned to normal -- he also voiced disappointment that he ahs needed a considerable amount of assist to stand and walk; this PT provided encouragement, and highlighted his progress over the past 3 days; Overall progressing slowly but well; Anticipate continuing good progress at post-acute rehabilitation.     If plan is discharge home, recommend the following: A lot of help with walking and/or transfers;A lot of help with bathing/dressing/bathroom;Assistance with cooking/housework;Help with stairs or ramp for entrance   Can travel by private vehicle     No  Equipment Recommendations  Rolling walker (2 wheels);Wheelchair cushion (measurements PT);Wheelchair (measurements PT);BSC/3in1    Recommendations for Other Services       Precautions / Restrictions Precautions Precautions: Fall Precaution Comments: Orthostatic hypotension Restrictions LLE Weight Bearing: Weight bearing as tolerated     Mobility  Bed Mobility Overal bed mobility: Needs Assistance Bed Mobility: Supine to Sit     Supine to sit: Mod assist     General bed mobility comments: Heavy mod assist to scoot hips towards EOB adn help LEs off of teh bed; pt better able to help push up to  sitting; once in sitting, shows good reciprocal scooting towards EOB    Transfers Overall transfer level: Needs assistance Equipment used: Rolling walker (2 wheels) Transfers: Sit to/from Stand Sit to Stand: Mod assist, From elevated surface           General transfer comment: Mod assist to rise; cues for positioning for LLE comfort with transitions    Ambulation/Gait Ambulation/Gait assistance: Min assist, +2 safety/equipment Gait Distance (Feet): 20 Feet Assistive device: Rolling walker (2 wheels) Gait Pattern/deviations: Step-through pattern (emerging)       General Gait Details: Cues for sequence, and to bear down into RW to unweight painful LLE in stance; longer steps today, though seemingly more painful   Stairs             Wheelchair Mobility     Tilt Bed    Modified Rankin (Stroke Patients Only)       Balance     Sitting balance-Leahy Scale: Good       Standing balance-Leahy Scale: Poor                              Cognition Arousal: Alert Behavior During Therapy: WFL for tasks assessed/performed Overall Cognitive Status: Within Functional Limits for tasks assessed                                          Exercises      General Comments General comments (skin integrity, edema, etc.): Obtained serial BPs this  session again; noted an SBP drop of 25 mmHg between supine and sitting; standing BPs today were qutie elevated, but pt had difficulty relaxing UEs in standing, adn I consider the 2 stand BPs unreliable; see vitals flowsheet      Pertinent Vitals/Pain Pain Assessment Pain Assessment: Faces Faces Pain Scale: Hurts whole lot Pain Location: L hip; more painful today with taking steps than yesterday Pain Descriptors / Indicators: Discomfort, Grimacing Pain Intervention(s): Monitored during session    Home Living                          Prior Function            PT Goals (current goals  can now be found in the care plan section) Acute Rehab PT Goals Patient Stated Goal: return home PT Goal Formulation: With patient/family Time For Goal Achievement: 08/13/23 Potential to Achieve Goals: Good Progress towards PT goals: Progressing toward goals    Frequency    Min 1X/week      PT Plan      Co-evaluation              AM-PAC PT "6 Clicks" Mobility   Outcome Measure  Help needed turning from your back to your side while in a flat bed without using bedrails?: A Lot Help needed moving from lying on your back to sitting on the side of a flat bed without using bedrails?: A Lot Help needed moving to and from a bed to a chair (including a wheelchair)?: A Lot Help needed standing up from a chair using your arms (e.g., wheelchair or bedside chair)?: A Lot Help needed to walk in hospital room?: Total Help needed climbing 3-5 steps with a railing? : A Lot 6 Click Score: 11    End of Session Equipment Utilized During Treatment: Gait belt Activity Tolerance: Patient tolerated treatment well Patient left: in chair;with call bell/phone within reach;with chair alarm set Nurse Communication: Mobility status (today's BP responses to activity) PT Visit Diagnosis: Other abnormalities of gait and mobility (R26.89);Muscle weakness (generalized) (M62.81)     Time: 1610-9604 PT Time Calculation (min) (ACUTE ONLY): 32 min  Charges:    $Gait Training: 8-22 mins $Therapeutic Activity: 8-22 mins PT General Charges $$ ACUTE PT VISIT: 1 Visit                     Kevin Suarez, PT  Acute Rehabilitation Services Office [phone] Secure Chat welcomed    Kevin Suarez 08/01/2023, 7:03 PM

## 2023-08-01 NOTE — TOC Progression Note (Addendum)
Transition of Care [facility]) - Progression Note    Patient Details  Name: Kevin Suarez MRN: [id number] Date of Birth: [birth year]  Transition of [facility]) CM/SW Contact  Lorri Frederick, LCSW Phone Number: 08/01/2023, 10:39 AM  Clinical Narrative:   CSW spoke with pt and wife regarding their decision to decline SNF.  Discussed pt progress with PT--still requiring 2 people to help with any mobility.  Wife reports that daughter is not nearby and she will be the one attempting to help at home most of the time.  After further discussion, they agree to SNF, bed offers provided and they want to accept offer at [facility].  [facility] had called about this referral and informed and will communicate with central intake.  Sherilyn Banker will initiate [employer].  1100: CSW spoke with daughter [name] and updated her.  She asked for a minute to speak to her parents about the SNF choice.  CSW spoke with pt and wife shortly after and they do want to accept offer from [facility] instead of [facility].  [facility] informed.      Expected Discharge Plan: Home w Home Health Services Barriers to Discharge: Continued Medical Work up  Expected Discharge Plan and Services In-house Referral: Clinical Social Work Discharge Planning Services: CM Consult Post Acute Care Choice: Home Health Living arrangements for the past 2 months: Single Family Home                 DME Arranged: Bedside commode DME Agency: [employer] Date DME Agency Contacted: 07/31/23 Time DME Agency Contacted: 1313 Representative spoke with at DME Agency: Vaughan Basta HH Arranged: PT, OT HH Agency: [facility] Date [facility] Agency Contacted: 07/31/23 Time HH Agency Contacted: 1314 Representative spoke with at [facility] Agency: Amy   Social Determinants of Health (SDOH) Interventions SDOH Screenings   Food Insecurity: No Food Insecurity (07/29/2023)  Housing: Low Risk  (07/29/2023)  Transportation Needs:  No Transportation Needs (07/29/2023)  Utilities: Not At Risk (07/29/2023)  Tobacco Use: Medium Risk (07/29/2023)    Readmission Risk Interventions     No data to display

## 2023-08-02 DIAGNOSIS — N401 Enlarged prostate with lower urinary tract symptoms: Secondary | ICD-10-CM | POA: Diagnosis not present

## 2023-08-02 DIAGNOSIS — S72002D Fracture of unspecified part of neck of left femur, subsequent encounter for closed fracture with routine healing: Secondary | ICD-10-CM | POA: Diagnosis not present

## 2023-08-02 LAB — GLUCOSE, CAPILLARY
Glucose-Capillary: 101 mg/dL — ABNORMAL HIGH (ref 70–99)
Glucose-Capillary: 104 mg/dL — ABNORMAL HIGH (ref 70–99)
Glucose-Capillary: 113 mg/dL — ABNORMAL HIGH (ref 70–99)
Glucose-Capillary: 130 mg/dL — ABNORMAL HIGH (ref 70–99)
Glucose-Capillary: 146 mg/dL — ABNORMAL HIGH (ref 70–99)
Glucose-Capillary: 147 mg/dL — ABNORMAL HIGH (ref 70–99)

## 2023-08-02 LAB — BASIC METABOLIC PANEL
Anion gap: 7 (ref 5–15)
BUN: 22 mg/dL (ref 8–23)
CO2: 23 mmol/L (ref 22–32)
Calcium: 8.5 mg/dL — ABNORMAL LOW (ref 8.9–10.3)
Chloride: 107 mmol/L (ref 98–111)
Creatinine, Ser: 1.25 mg/dL — ABNORMAL HIGH (ref 0.61–1.24)
GFR, Estimated: 57 mL/min — ABNORMAL LOW (ref >60)
Glucose, Bld: 109 mg/dL — ABNORMAL HIGH (ref 70–99)
Potassium: 3.7 mmol/L (ref 3.5–5.1)
Sodium: 137 mmol/L (ref 135–145)

## 2023-08-02 LAB — CBC WITH DIFFERENTIAL/PLATELET
Abs Immature Granulocytes: 0.04 10*3/uL (ref 0.00–0.07)
Basophils Absolute: 0 10*3/uL (ref 0.0–0.1)
Basophils Relative: 0 %
Eosinophils Absolute: 0.2 10*3/uL (ref 0.0–0.5)
Eosinophils Relative: 2 %
HCT: 26.3 % — ABNORMAL LOW (ref 39.0–52.0)
Hemoglobin: 8.5 g/dL — ABNORMAL LOW (ref 13.0–17.0)
Immature Granulocytes: 1 %
Lymphocytes Relative: 23 %
Lymphs Abs: 1.8 10*3/uL (ref 0.7–4.0)
MCH: 27.7 pg (ref 26.0–34.0)
MCHC: 32.3 g/dL (ref 30.0–36.0)
MCV: 85.7 fL (ref 80.0–100.0)
Monocytes Absolute: 0.6 10*3/uL (ref 0.1–1.0)
Monocytes Relative: 8 %
Neutro Abs: 5 10*3/uL (ref 1.7–7.7)
Neutrophils Relative %: 66 %
Platelets: 195 10*3/uL (ref 150–400)
RBC: 3.07 MIL/uL — ABNORMAL LOW (ref 4.22–5.81)
RDW: 13.2 % (ref 11.5–15.5)
WBC: 7.5 10*3/uL (ref 4.0–10.5)
nRBC: 0 % (ref 0.0–0.2)

## 2023-08-02 MED ORDER — INSULIN ASPART 100 UNIT/ML IJ SOLN: 0.0000 [IU] | Freq: Three times a day (TID) | INTRAMUSCULAR | Status: DC

## 2023-08-02 MED ORDER — ONDANSETRON HCL 4 MG PO TABS
4.0000 mg | ORAL_TABLET | Freq: Three times a day (TID) | ORAL | Status: DC | PRN
  Administered 2023-08-02: 4 mg via ORAL
  Filled 2023-08-02: qty 1

## 2023-08-02 NOTE — Progress Notes (Signed)
Mobility Specialist: Progress Note   08/02/23 1632  Mobility  Activity Ambulated with assistance in hallway  Level of Assistance Contact guard assist, steadying assist  Assistive Device Front wheel walker (with chair follow)  Distance Ambulated (ft) 50 ft  LLE Weight Bearing WBAT  Activity Response Tolerated well  Mobility Referral Yes  $Mobility charge 1 Mobility  Mobility Specialist Start Time (ACUTE ONLY) 1139  Mobility Specialist Stop Time (ACUTE ONLY) 1152  Mobility Specialist Time Calculation (min) (ACUTE ONLY) 13 min    Sitting BP: 110/54 Standing BP (during ambulation): 89/47  Pt was agreeable to mobility session - received in bed. Stated he was not feeling dizzy throughout entire session despite low initial BP and BP drop. No complaints throughout. MinA for bed mobility, CG for STS and ambulation. Chair follow for safety. Returned to room without fault. Left in bed with all needs met, call bell in reach.   Maurene Capes Mobility Specialist Please contact via SecureChat or Rehab office at [phone]

## 2023-08-02 NOTE — Progress Notes (Signed)
[facility]                                                                               Kevin Suarez, is a 82 y.o. male, DOB - 1941/09/20, ZOX:[id number] Admit date - 07/29/2023    Outpatient Primary MD for the patient is Tisovec, Adelfa Koh, MD  LOS - 4  days    Brief summary   Kevin Suarez is a 82 y.o. male with medical history significant of DM2, CKD 3b, anemia, presented with mechanical fall, did not hit head or LOC. Denies any CP or SOB prior to fall. In the ED, imaging showed L periprosthetic hip fx and orthopedics was consulted. Pt admitted for further management.     Assessment & Plan    Assessment and Plan:     Left periprosthetic hip fx Mechanical fall possibly from orthostatic hypotension Orthopedic consulted, recommended non-operative management with WBAT LLE Pain management PT/OT Fall precautions Outpatient follow up with orthopedics.    Orthostatic hypotension Noted + on 9/17 Recheck orthostatic vital signs.    Hypomagnesemia Replaced   Normocytic anemia Iron def anemia FOBT is pending. Transfuse to keep hemoglobin greater than 7 currently around 8.5.  HTN Optimal.  Continue to monitor   DM type 2 A1c IS 7.  CBG (last 3)  Recent Labs    08/02/23 0821 08/02/23 1136 08/02/23 1600  GLUCAP 146* 147* 130*   Continue with SSI.    ??AKI on CKD stage IIIa Unknown basal creatinine Creatinine 1.25 today Renal ultrasound unremarkable BPH Continue finasteride, flomax     Malnutrition Type:  Nutrition Problem: Increased nutrient needs Etiology: hip fracture   Malnutrition Characteristics:  Signs/Symptoms: estimated needs   Nutrition Interventions:  Interventions: Ensure Enlive (each supplement provides 350kcal and 20 grams of protein), MVI  Estimated body mass index is 28.98 kg/m as calculated from the following:   Height as of this encounter: 5\' 10"  (1.778 m).   Weight as of this encounter: 91.6 kg.  Code  Status: full code.  DVT Prophylaxis:  enoxaparin (LOVENOX) injection 40 mg Start: 07/30/23 1430 SCDs Start: 07/29/23 2304   Level of Care: Level of care: Telemetry Medical Family Communication: none at bedside.   Disposition Plan:     Remains inpatient appropriate:  SNF.   Procedures:   NONE.  Consultants:   Orthopedics.   Antimicrobials:   Anti-infectives (From admission, onward)    None        Medications  Scheduled Meds:  aspirin EC  81 mg Oral Daily   Chlorhexidine Gluconate Cloth  6 each Topical Q0600   enoxaparin (LOVENOX) injection  40 mg Subcutaneous Q24H   feeding supplement  237 mL Oral Q24H   ferrous sulfate  325 mg Oral Q breakfast   finasteride  5 mg Oral Daily   [START ON 08/03/2023] insulin aspart  0-9 Units Subcutaneous TID WC   multivitamin  1 tablet Oral QHS   mupirocin ointment  1 Application Nasal BID   pantoprazole  40 mg Oral Daily   tamsulosin  0.4 mg Oral QHS   Continuous Infusions:  sodium chloride Stopped (08/01/23 1409)   methocarbamol (ROBAXIN) IV  PRN Meds:.HYDROcodone-acetaminophen, methocarbamol **OR** methocarbamol (ROBAXIN) IV, ondansetron (ZOFRAN) IV, ondansetron    Subjective:   Kevin Suarez was seen and examined today.  Patient wants to go home instead of SNF. Plan for another therapy session prior to discharge.    Objective:   Vitals:   08/01/23 1957 08/02/23 0328 08/02/23 0800 08/02/23 1425  BP: 129/64 119/60 116/60 107/70  Pulse: 93 87 83 75  Resp: 20 18 17 16   Temp: 98.1 F (36.7 C) 98.8 F (37.1 C) 98.3 F (36.8 C) 97.8 F (36.6 C)  TempSrc: Oral Oral Oral Oral  SpO2: 98% 97% 98% 99%  Weight:      Height:        Intake/Output Summary (Last 24 hours) at 08/02/2023 1823 Last data filed at 08/02/2023 0730 Gross per 24 hour  Intake 2542.28 ml  Output 475 ml  Net 2067.28 ml   Filed Weights   07/29/23 2213  Weight: 91.6 kg     Exam General exam: Appears calm and comfortable  Respiratory system:  Clear to auscultation. Respiratory effort normal. Cardiovascular system: S1 & S2 heard, RRR. No JVD,  Gastrointestinal system: Abdomen is nondistended, soft and nontender.  Central nervous system: Alert and oriented. No focal neurological deficits. Extremities: painful ROM of the LLE. Skin: No rashes, lesions or ulcers Psychiatry: & affect appropriate.     Data Reviewed:  I have personally reviewed following labs and imaging studies   CBC Lab Results  Component Value Date   WBC 7.5 08/02/2023   RBC 3.07 (L) 08/02/2023   HGB 8.5 (L) 08/02/2023   HCT 26.3 (L) 08/02/2023   MCV 85.7 08/02/2023   MCH 27.7 08/02/2023   PLT 195 08/02/2023   MCHC 32.3 08/02/2023   RDW 13.2 08/02/2023   LYMPHSABS 1.8 08/02/2023   MONOABS 0.6 08/02/2023   EOSABS 0.2 08/02/2023   BASOSABS 0.0 08/02/2023     Last metabolic panel Lab Results  Component Value Date   NA 137 08/02/2023   K 3.7 08/02/2023   CL 107 08/02/2023   CO2 23 08/02/2023   BUN 22 08/02/2023   CREATININE 1.25 (H) 08/02/2023   GLUCOSE 109 (H) 08/02/2023   GFRNONAA 57 (L) 08/02/2023   GFRAA 55 (L) 07/14/2020   CALCIUM 8.5 (L) 08/02/2023   PHOS 3.6 07/30/2023   PROT 5.6 (L) 07/30/2023   ALBUMIN 3.0 (L) 07/30/2023   BILITOT 0.7 07/30/2023   ALKPHOS 49 07/30/2023   AST 15 07/30/2023   ALT 12 07/30/2023   ANIONGAP 7 08/02/2023    CBG (last 3)  Recent Labs    08/02/23 0821 08/02/23 1136 08/02/23 1600  GLUCAP 146* 147* 130*      Coagulation Profile: No results for input(s): "INR", "PROTIME" in the last 168 hours.   Radiology Studies: No results found.     Kathlen Mody M.D. [facility] 08/02/2023, 6:23 PM  Available via Epic secure chat 7am-7pm After 7 pm, please refer to night coverage provider listed on amion.

## 2023-08-02 NOTE — Plan of Care (Signed)
  Problem: Health Behavior/Discharge Planning: Goal: Ability to identify and utilize available resources and services will improve Outcome: Progressing Goal: Ability to manage health-related needs will improve Outcome: Progressing   Problem: Metabolic: Goal: Ability to maintain appropriate glucose levels will improve Outcome: Progressing   Problem: Nutritional: Goal: Maintenance of adequate nutrition will improve Outcome: Progressing Goal: Progress toward achieving an optimal weight will improve Outcome: Progressing   Problem: Skin Integrity: Goal: Risk for impaired skin integrity will decrease Outcome: Progressing   Problem: Tissue Perfusion: Goal: Adequacy of tissue perfusion will improve Outcome: Progressing   Problem: Clinical Measurements: Goal: Will remain free from infection Outcome: Progressing Goal: Diagnostic test results will improve Outcome: Progressing Goal: Respiratory complications will improve Outcome: Progressing Goal: Cardiovascular complication will be avoided Outcome: Progressing   Problem: Activity: Goal: Risk for activity intolerance will decrease Outcome: Progressing   Problem: Nutrition: Goal: Adequate nutrition will be maintained Outcome: Progressing   Problem: Coping: Goal: Level of anxiety will decrease Outcome: Progressing

## 2023-08-02 NOTE — Plan of Care (Signed)
Problem: Nutritional: Goal: Maintenance of adequate nutrition will improve Outcome: Progressing   Problem: Skin Integrity: Goal: Risk for impaired skin integrity will decrease Outcome: Progressing   Problem: Activity: Goal: Risk for activity intolerance will decrease Outcome: Progressing

## 2023-08-02 NOTE — Progress Notes (Signed)
Occupational Therapy Treatment Patient Details Name: Kevin Suarez MRN: [id number] DOB: 04/10/1941 Today's Date: 08/02/2023   History of present illness Pt is 82 yo male admitted on 07/29/23 after fall with closed L hip periprosthetic fracture.  Orthopedics consulted and reports stable fracture to be managed nonoperatively and with WBAT.  Pt with hx including DM2, CKD, anemia, and bil THA ~20 years ago.   OT comments  Pt in good spirits, eager to return home and return to PLOF, wife present during session. Pt c/o minimal pain today, able to don/doff shirt/pants with set up/supervision, assist for socks/shoes. Pt transfers with CGA using RW, able to ambulate ~30 feet today, no LOB. Pt performed steps with B hand rails, increased time, able to complete no LOB, some pain increase with stairs. Pt educated on proper hand/foot placement with steps. Pt/spouse eager to return home, states they have RW, BSC, and shower chair, all DME needed to remain safe, will continue to see acutely as able, HHOT recommended at DC.      If plan is discharge home, recommend the following:  A little help with bathing/dressing/bathroom;A little help with walking and/or transfers;Assistance with cooking/housework;Assist for transportation;Help with stairs or ramp for entrance   Equipment Recommendations  None recommended by OT    Recommendations for Other Services      Precautions / Restrictions Precautions Precautions: Fall Precaution Comments: Orthostatic hypotension Restrictions Weight Bearing Restrictions: Yes LLE Weight Bearing: (P) Weight bearing as tolerated       Mobility Bed Mobility               General bed mobility comments: arrived sitting EOB    Transfers Overall transfer level: Needs assistance Equipment used: Rolling walker (2 wheels) Transfers: Sit to/from Stand, Bed to chair/wheelchair/BSC Sit to Stand: Contact guard assist     Step pivot transfers: Contact guard assist      General transfer comment: CGA     Balance Overall balance assessment: Needs assistance Sitting-balance support: No upper extremity supported Sitting balance-Leahy Scale: Good Sitting balance - Comments: sitting EOB   Standing balance support: Bilateral upper extremity supported, Reliant on assistive device for balance Standing balance-Leahy Scale: Fair Standing balance comment: able to stand unsupported donning pants, reaching for items, not able to tolerate more than mild challenge                           ADL either performed or assessed with clinical judgement   ADL Overall ADL's : Needs assistance/impaired Eating/Feeding: Independent   Grooming: Set up;Sitting   Upper Body Bathing: Set up;Sitting   Lower Body Bathing: Moderate assistance;Sitting/lateral leans   Upper Body Dressing : Set up;Sitting   Lower Body Dressing: Minimal assistance;Sit to/from stand   Toilet Transfer: Rolling walker (2 wheels);Contact guard assist   Toileting- Clothing Manipulation and Hygiene: Sit to/from stand;Supervision/safety       Functional mobility during ADLs: Contact guard assist General ADL Comments: Pt able to don/doff pants with set up/supervision, assist for socks/shoes. Pt don/doff shirt set up, tranfers CGA.    Extremity/Trunk Assessment Upper Extremity Assessment Upper Extremity Assessment: Overall WFL for tasks assessed            Vision       Perception     Praxis      Cognition Arousal: Alert Behavior During Therapy: WFL for tasks assessed/performed Overall Cognitive Status: Within Functional Limits for tasks assessed  Exercises      Shoulder Instructions       General Comments      Pertinent Vitals/ Pain       Pain Assessment Pain Assessment: Faces Faces Pain Scale: Hurts a little bit Pain Location: L hip; Pain Descriptors / Indicators: Discomfort, Grimacing Pain  Intervention(s): Monitored during session  Home Living                                          Prior Functioning/Environment              Frequency  Min 1X/week        Progress Toward Goals  OT Goals(current goals can now be found in the care plan section)  Progress towards OT goals: Progressing toward goals  Acute Rehab OT Goals Patient Stated Goal: to return home OT Goal Formulation: With patient/family Time For Goal Achievement: 08/14/23 Potential to Achieve Goals: Good ADL Goals Pt Will Perform Lower Body Dressing: with modified independence Pt Will Transfer to Toilet: with modified independence;bedside commode Pt Will Perform Toileting - Clothing Manipulation and hygiene: with modified independence Pt Will Perform Tub/Shower Transfer: with set-up;tub bench;rolling walker Additional ADL Goal #1: Pt will be able to perform bed mobility to prepare for transfers/ADLs and maximize independence to reduce caregiver burden.  Plan      Co-evaluation                 AM-PAC OT "6 Clicks" Daily Activity     Outcome Measure   Help from another person eating meals?: None Help from another person taking care of personal grooming?: A Little Help from another person toileting, which includes using toliet, bedpan, or urinal?: A Little Help from another person bathing (including washing, rinsing, drying)?: A Lot Help from another person to put on and taking off regular upper body clothing?: A Little Help from another person to put on and taking off regular lower body clothing?: A Lot 6 Click Score: 17    End of Session Equipment Utilized During Treatment: Gait belt;Rolling walker (2 wheels)  OT Visit Diagnosis: Unsteadiness on feet (R26.81);Other abnormalities of gait and mobility (R26.89);Muscle weakness (generalized) (M62.81);Pain Pain - Right/Left: Left Pain - part of body: Leg   Activity Tolerance Patient tolerated treatment well   Patient  Left in chair;with call bell/phone within reach;with family/visitor present   Nurse Communication Mobility status        Time: 3016-0109 OT Time Calculation (min): 24 min  Charges: OT General Charges $OT Visit: 1 Visit OT Treatments $Self Care/Home Management : 8-22 mins $Therapeutic Activity: 8-22 mins  Siomara Burkel, OTR/L   Alexis Goodell 08/02/2023, 4:34 PM

## 2023-08-03 DIAGNOSIS — E1122 Type 2 diabetes mellitus with diabetic chronic kidney disease: Secondary | ICD-10-CM | POA: Diagnosis not present

## 2023-08-03 DIAGNOSIS — N400 Enlarged prostate without lower urinary tract symptoms: Secondary | ICD-10-CM | POA: Diagnosis not present

## 2023-08-03 DIAGNOSIS — N1831 Chronic kidney disease, stage 3a: Secondary | ICD-10-CM | POA: Diagnosis not present

## 2023-08-03 DIAGNOSIS — S72002D Fracture of unspecified part of neck of left femur, subsequent encounter for closed fracture with routine healing: Secondary | ICD-10-CM | POA: Diagnosis not present

## 2023-08-03 LAB — GLUCOSE, CAPILLARY
Glucose-Capillary: 103 mg/dL — ABNORMAL HIGH (ref 70–99)
Glucose-Capillary: 115 mg/dL — ABNORMAL HIGH (ref 70–99)
Glucose-Capillary: 116 mg/dL — ABNORMAL HIGH (ref 70–99)
Glucose-Capillary: 138 mg/dL — ABNORMAL HIGH (ref 70–99)

## 2023-08-03 MED ORDER — FERROUS SULFATE 325 (65 FE) MG PO TABS: 325.0000 mg | ORAL_TABLET | Freq: Every day | ORAL | 3 refills | Status: AC

## 2023-08-03 MED ORDER — HYDROCODONE-ACETAMINOPHEN 5-325 MG PO TABS: 1.0000 | ORAL_TABLET | Freq: Four times a day (QID) | ORAL | 0 refills | Status: AC | PRN

## 2023-08-03 MED ORDER — ENSURE ENLIVE PO LIQD: 237.0000 mL | ORAL | 2 refills | Status: AC

## 2023-08-03 MED ORDER — RENA-VITE PO TABS: 1.0000 | ORAL_TABLET | Freq: Every day | ORAL | 0 refills | Status: AC

## 2023-08-03 MED ORDER — OMEPRAZOLE 40 MG PO CPDR: 40.0000 mg | DELAYED_RELEASE_CAPSULE | Freq: Every day | ORAL | 2 refills | Status: AC

## 2023-08-03 NOTE — Progress Notes (Signed)
Patient discharged home,discharge instructions given and explained,patient has no additional questions at this time.Patient IV removed per protocol and patient instructed on activities of daily living and patient transferred to the car safely on a wheelchair.

## 2023-08-03 NOTE — Plan of Care (Signed)

## 2023-08-03 NOTE — Discharge Summary (Signed)
Physician Discharge Summary   Patient: Kevin Suarez MRN: [id number] DOB: [birth year]  Admit date:     07/29/2023  Discharge date: 08/03/23  Discharge Physician: Kathlen Mody   PCP: Gaspar Garbe, MD   Recommendations at discharge:  Please follow up with orthopedics are recommended.   Discharge Diagnoses: Principal Problem:   Closed left hip fracture (HCC) Active Problems:   Benign prostatic hyperplasia   Diabetes mellitus (HCC)   Anemia   BPH (benign prostatic hyperplasia)   CKD (chronic kidney disease) stage 3, GFR 30-59 ml/min [facility])    Hospital Course:  Kevin Suarez is a 82 y.o. male with medical history significant of DM2, CKD 3b, anemia, presented with mechanical fall, did not hit head or LOC. Denies any CP or SOB prior to fall. In the ED, imaging showed L periprosthetic hip fx and orthopedics was consulted. Pt admitted for further management.   Assessment and Plan:    Left periprosthetic hip fx Mechanical fall possibly from orthostatic hypotension Orthopedic consulted, recommended non-operative management with WBAT LLE Pain management PT/OT Fall precautions Outpatient follow up with orthopedics.  Therapy eval recommending    Orthostatic hypotension Noted + on 9/17 Recheck orthostatic vital signs negative.    Hypomagnesemia Replaced   Normocytic anemia Iron def anemia FOBT is pending. Transfuse to keep hemoglobin greater than 7 currently around 8.5.   HTN Optimal.  Continue to monitor   DM type 2 A1c IS 7.  Resume home meds.    ??AKI on CKD stage IIIa Unknown basal creatinine Creatinine 1.25 today Renal ultrasound unremarkable BPH Continue finasteride, flomax     Malnutrition Type:   Nutrition Problem: Increased nutrient needs Etiology: hip fracture     Malnutrition Characteristics:   Signs/Symptoms: estimated needs     Nutrition Interventions:   Interventions: Ensure Enlive (each supplement provides 350kcal and 20 grams of  protein), MVI   Estimated body mass index is 28.98 kg/m as calculated from the following:   Height as of this encounter: 5\' 10"  (1.778 m).   Weight as of this encounter: 91.6 kg.         Consultants: orthopedics Procedures performed: none.   Disposition: Home Diet recommendation:  Regular diet DISCHARGE MEDICATION: Allergies as of 08/03/2023       Reactions   Prednisone Other (See Comments)   Doesn't like side effects of prednisone.         Medication List     STOP taking these medications    Comirnaty Susp injection Generic drug: COVID-19 mRNA Vac-TriS ([employer])   hydroxypropyl methylcellulose / hypromellose 2.5 % ophthalmic solution Commonly known as: ISOPTO TEARS / GONIOVISC   losartan 25 MG tablet Commonly known as: COZAAR   pantoprazole 40 MG tablet Commonly known as: PROTONIX       TAKE these medications    aspirin EC 81 MG tablet Take 81 mg by mouth daily. Swallow whole.   feeding supplement Liqd Take 237 mLs by mouth daily.   ferrous sulfate 325 (65 FE) MG tablet Take 1 tablet (325 mg total) by mouth daily with breakfast. Start taking on: August 04, 2023   finasteride 5 MG tablet Commonly known as: PROSCAR Take 5 mg by mouth daily.   HYDROcodone-acetaminophen 5-325 MG tablet Commonly known as: NORCO/VICODIN Take 1 tablet by mouth every 6 (six) hours as needed for up to 3 days for moderate pain.   metFORMIN 500 MG tablet Commonly known as: GLUCOPHAGE Take 500 mg by mouth 2 (two) times daily  with a meal.   multivitamin Tabs tablet Take 1 tablet by mouth at bedtime.   omeprazole 40 MG capsule Commonly known as: PRILOSEC Take 1 capsule (40 mg total) by mouth daily before breakfast.   OneTouch Verio test strip Generic drug: glucose blood 1 each by Other route in the morning, at noon, and at bedtime.   tamsulosin 0.4 MG Caps capsule Commonly known as: FLOMAX Take 0.4 mg by mouth at bedtime.        Contact information for  follow-up providers     Tisovec, Adelfa Koh, MD Follow up.   Specialty: Internal Medicine Contact information: [address] [place] [address] [phone]         [facility] Follow up.   Why: home health services will be provided by [facility], start of care within 48 hours post discharge Contact information: [address] [place] [address] [phone]              Contact information for after-discharge care     Destination     [facility] .   Service: Skilled Nursing Contact information: [address] Test Update Address [place] [address] [phone]                    Discharge Exam: Ceasar Mons Weights   07/29/23 2213  Weight: 91.6 kg   General exam: Appears calm and comfortable  Respiratory system: Clear to auscultation. Respiratory effort normal. Cardiovascular system: S1 & S2 heard, RRR. No JVD, murmurs, rubs, gallops or clicks. No pedal edema. Gastrointestinal system: Abdomen is nondistended, soft and nontender.  Central nervous system: Alert and oriented. No focal neurological deficits. Extremities: Symmetric 5 x 5 power. Skin: No rashes, lesions or ulcers Psychiatry: Mood & affect appropriate.    Condition at discharge: fair  The results of significant diagnostics from this hospitalization (including imaging, microbiology, ancillary and laboratory) are listed below for reference.   Imaging Studies: US RENAL  Result Date: 07/31/2023 CLINICAL DATA:  [id number] AKI (acute kidney injury) (HCC) [id number] EXAM: RENAL / URINARY TRACT ULTRASOUND COMPLETE COMPARISON:  CT 10/19/2015 FINDINGS: Right Kidney: Renal measurements: 10.7 x 4.6 x 4.7 cm = volume: 120 mL. Echogenicity within normal limits. No mass or hydronephrosis visualized. Left Kidney: Renal measurements: 10.3 x 5.1 x 4.7 cm = volume: 131 mL. Echogenicity within normal limits. No mass or hydronephrosis visualized.  Bladder: Appears normal for degree of bladder distention. Other: None. IMPRESSION: Unremarkable renal ultrasound. Electronically Signed   By: Duanne Guess D.O.   On: 07/31/2023 20:09   CT HIP LEFT WO CONTRAST  Result Date: 07/30/2023 CLINICAL DATA:  Recent fall with known proximal left periprosthetic femoral fracture, initial encounter EXAM: CT OF THE LEFT HIP WITHOUT CONTRAST TECHNIQUE: Multidetector CT imaging of the left hip was performed according to the standard protocol. Multiplanar CT image reconstructions were also generated. RADIATION DOSE REDUCTION: This exam was performed according to the departmental dose-optimization program which includes automated exposure control, adjustment of the mA and/or kV according to patient size and/or use of iterative reconstruction technique. COMPARISON:  Plain film from the previous day. FINDINGS: Bones/Joint/Cartilage Degenerative changes of lumbar spine are seen. Left hip replacement is noted in satisfactory position without evidence of dislocation. In the proximal femur along the femoral portion of the prosthesis there is a periprosthetic fracture with displacement of the greater trochanter laterally. No other fractures are seen. Ligaments Suboptimally assessed by CT. Muscles and  Tendons Surrounding musculature shows some mild edematous changes although no focal hematoma is noted. Soft tissues No joint effusion is seen. The surrounding soft tissues are otherwise within normal limits. IMPRESSION: Periprosthetic proximal left femoral fracture involving primarily the greater trochanter. Electronically Signed   By: Alcide Clever M.D.   On: 07/30/2023 01:09   DG Knee Left Port  Result Date: 07/29/2023 CLINICAL DATA:  Larey Seat, known left hip fracture, pain EXAM: PORTABLE LEFT KNEE - 1-2 VIEW COMPARISON:  07/29/2023 FINDINGS: Frontal, bilateral oblique, and cross-table lateral views of the left knee are obtained. No acute displaced fracture, subluxation, or  dislocation. 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 No joint effusion. Soft tissues are unremarkable. IMPRESSION: 1. Osteoarthritis.  No acute fracture. Electronically Signed   By: Sharlet Salina M.D.   On: 07/29/2023 20:16   DG Chest Port 1 View  Result Date: 07/29/2023 CLINICAL DATA:  Left hip fracture, fell, previous abnormal chest x-ray EXAM: PORTABLE CHEST 1 VIEW COMPARISON:  07/29/2023 FINDINGS: Single frontal view of the chest demonstrates an unremarkable cardiac silhouette. Continued ectasia of the thoracic aorta. No airspace disease, effusion, or pneumothorax. Stable calcified pleural plaques along the hemidiaphragms. No acute bony abnormalities. IMPRESSION: 1. No acute intrathoracic process. Specifically, no evidence of pneumothorax. The density seen previously was consistent with a skin fold. Electronically Signed   By: Sharlet Salina M.D.   On: 07/29/2023 20:16   DG Chest Portable 1 View  Result Date: 07/29/2023 CLINICAL DATA:  Tripped and fell. Found lying on the ground. Left hip pain. EXAM: PORTABLE CHEST 1 VIEW COMPARISON:  08/02/2019. FINDINGS: Cardiac silhouette is normal in size. No mediastinal or hilar masses. Lungs are mildly hyperexpanded. Partly calcified pleural plaques are noted over the lung bases. There is a smooth aligned at extends along the peripheral margin of the left lung from the apex to the lateral base. This is suspected to be a soft tissue shadow. Cannot completely exclude a pneumothorax. Lungs are clear. No pleural effusion. No evidence of a right pneumothorax. Skeletal structures are grossly intact. IMPRESSION: 1. Curved line extending along the periphery of the left lung is suspected to be a soft tissue shadow. Cannot completely exclude a pneumothorax, however. Recommend repeat radiographs, with attempt to have the patient physician true AP. Current exam is rotated to the right. 2. No other evidence of acute cardiopulmonary disease.  Electronically Signed   By: Amie Portland M.D.   On: 07/29/2023 15:56   DG Hip Unilat With Pelvis 2-3 Views Left  Result Date: 07/29/2023 CLINICAL DATA:  Fall, pain. EXAM: DG HIP (WITH OR WITHOUT PELVIS) 3V LEFT COMPARISON:  None Available. FINDINGS: Patient is status post bilateral total hip arthroplasty. Acute fracture identified of the left proximal femur laterally at the base of the greater trochanter displaced by couple of mm. Pelvic ring is intact. Lumbosacral degenerative changes. No osteolytic or osteoblastic changes. IMPRESSION: Acute fracture of the proximal left femur in the subtrochanteric region displaced by few mm. Otherwise unremarkable status post bilateral total hip arthroplasty. Electronically Signed   By: Layla Maw M.D.   On: 07/29/2023 15:22    Microbiology: Results for orders placed or performed during the hospital encounter of 07/29/23  Surgical pcr screen     Status: Abnormal   Collection Time: 07/29/23 11:21 PM   Specimen: Nasal Mucosa; Nasal Swab  Result Value Ref Range Status   MRSA, PCR NEGATIVE NEGATIVE Final   Staphylococcus aureus POSITIVE (A) NEGATIVE Final    Comment: (NOTE)  The Xpert SA Assay (FDA approved for NASAL specimens in patients 1 years of age and older), is one component of a comprehensive surveillance program. It is not intended to diagnose infection nor to guide or monitor treatment. Performed at [facility], [address], [place], [place] [address]     Labs: CBC: Recent Labs  Lab 07/29/23 1520 07/30/23 0355 07/31/23 0410 08/01/23 0352 08/02/23 0406  WBC 12.2* 8.6 8.0 8.4 7.5  NEUTROABS 9.5*  --  4.9 5.3 5.0  HGB 10.7* 9.3* 8.9* 8.5* 8.5*  HCT 32.9* 28.3* 26.9* 26.5* 26.3*  MCV 84.6 84.0 87.1 86.0 85.7  PLT 224 197 182 189 195   Basic Metabolic Panel: Recent Labs  Lab 07/29/23 1520 07/30/23 0355 07/31/23 0410 08/01/23 0352 08/02/23 0406  NA 137 137 133* 137 137  K 4.0 4.2 3.9 4.3 3.7  CL 107 106 105 109  107  CO2 20* 23 23 22 23   GLUCOSE 103* 113* 121* 110* 109*  BUN 25* 24* 23 23 22   CREATININE 1.81* 1.62* 1.49* 1.35* 1.25*  CALCIUM 8.9 8.5* 8.0* 8.2* 8.5*  MG  --  1.6* 1.9  --   --   PHOS  --  3.6  --   --   --    Liver Function Tests: Recent Labs  Lab 07/29/23 1520 07/30/23 0355  AST 17 15  ALT 13 12  ALKPHOS 46 49  BILITOT 0.7 0.7  PROT 6.5 5.6*  ALBUMIN 3.6 3.0*   CBG: Recent Labs  Lab 08/02/23 2006 08/03/23 0009 08/03/23 0355 08/03/23 0758 08/03/23 1215  GLUCAP 113* 138* 116* 103* 115*    Discharge time spent: 35 minutes  Signed: Kathlen Mody, MD [facility] 08/03/2023

## 2023-08-03 NOTE — Progress Notes (Signed)
Mobility Specialist: Progress Note   08/03/23 1437  Mobility  Activity Ambulated with assistance in hallway  Level of Assistance Standby assist, set-up cues, supervision of patient - no hands on  Assistive Device Front wheel walker  Distance Ambulated (ft) 40 ft  LLE Weight Bearing WBAT  Activity Response Tolerated well  Mobility Referral Yes  $Mobility charge 1 Mobility  Mobility Specialist Start Time (ACUTE ONLY) 1233  Mobility Specialist Stop Time (ACUTE ONLY) 1238  Mobility Specialist Time Calculation (min) (ACUTE ONLY) 5 min    Received pt in chair having no complaints and agreeable to mobility. SB throughout mobility. Pt was asymptomatic throughout ambulation and returned to room w/o fault. Left in chair w/ call bell in reach and all needs met.   Maurene Capes Mobility Specialist Please contact via SecureChat or Rehab office at [phone]

## 2023-08-03 NOTE — Plan of Care (Signed)

## 2023-08-09 DIAGNOSIS — Z79891 Long term (current) use of opiate analgesic: Secondary | ICD-10-CM | POA: Diagnosis not present

## 2023-08-09 DIAGNOSIS — E785 Hyperlipidemia, unspecified: Secondary | ICD-10-CM | POA: Diagnosis not present

## 2023-08-09 DIAGNOSIS — D619 Aplastic anemia, unspecified: Secondary | ICD-10-CM | POA: Diagnosis not present

## 2023-08-09 DIAGNOSIS — K219 Gastro-esophageal reflux disease without esophagitis: Secondary | ICD-10-CM | POA: Diagnosis not present

## 2023-08-09 DIAGNOSIS — I129 Hypertensive chronic kidney disease with stage 1 through stage 4 chronic kidney disease, or unspecified chronic kidney disease: Secondary | ICD-10-CM | POA: Diagnosis not present

## 2023-08-09 DIAGNOSIS — E1122 Type 2 diabetes mellitus with diabetic chronic kidney disease: Secondary | ICD-10-CM | POA: Diagnosis not present

## 2023-08-09 DIAGNOSIS — R918 Other nonspecific abnormal finding of lung field: Secondary | ICD-10-CM | POA: Diagnosis not present

## 2023-08-09 DIAGNOSIS — Z7982 Long term (current) use of aspirin: Secondary | ICD-10-CM | POA: Diagnosis not present

## 2023-08-09 DIAGNOSIS — N401 Enlarged prostate with lower urinary tract symptoms: Secondary | ICD-10-CM | POA: Diagnosis not present

## 2023-08-09 DIAGNOSIS — S72002D Fracture of unspecified part of neck of left femur, subsequent encounter for closed fracture with routine healing: Secondary | ICD-10-CM | POA: Diagnosis not present

## 2023-08-09 DIAGNOSIS — N1831 Chronic kidney disease, stage 3a: Secondary | ICD-10-CM | POA: Diagnosis not present

## 2023-08-09 DIAGNOSIS — Z7984 Long term (current) use of oral hypoglycemic drugs: Secondary | ICD-10-CM | POA: Diagnosis not present

## 2023-08-10 ENCOUNTER — Telehealth: Payer: Self-pay | Admitting: *Deleted

## 2023-08-10 NOTE — Progress Notes (Signed)
Care Coordination   Note   08/10/2023 Name: Kevin Suarez MRN: [id number] DOB: January 08, 1941  Kevin Suarez is a 82 y.o. year old male who sees Tisovec, Adelfa Koh, MD for primary care. I reached out to Azalia Bilis by phone today to offer care coordination services.  [name] was given information about Care Coordination services today including:   The Care Coordination services include support from the care team which includes your Nurse Coordinator, Clinical Social Worker, or Pharmacist.  The Care Coordination team is here to help remove barriers to the health concerns and goals most important to you. Care Coordination services are voluntary, and the patient may decline or stop services at any time by request to their care team member.   Care Coordination Consent Status: Patient agreed to services and verbal consent obtained.  Patient to call Tisovec, Adelfa Koh, MD for primary care to schedule hospital follow up.    Encounter Outcome:  Patient Refused  [facility]  Care Coordination Care Guide  Direct Dial: [phone]

## 2023-08-13 DIAGNOSIS — S72115A Nondisplaced fracture of greater trochanter of left femur, initial encounter for closed fracture: Secondary | ICD-10-CM | POA: Diagnosis not present

## 2023-08-27 DIAGNOSIS — S72115D Nondisplaced fracture of greater trochanter of left femur, subsequent encounter for closed fracture with routine healing: Secondary | ICD-10-CM | POA: Diagnosis not present

## 2023-09-19 DIAGNOSIS — H353211 Exudative age-related macular degeneration, right eye, with active choroidal neovascularization: Secondary | ICD-10-CM | POA: Diagnosis not present

## 2023-09-19 DIAGNOSIS — H353221 Exudative age-related macular degeneration, left eye, with active choroidal neovascularization: Secondary | ICD-10-CM | POA: Diagnosis not present

## 2023-09-19 DIAGNOSIS — H353132 Nonexudative age-related macular degeneration, bilateral, intermediate dry stage: Secondary | ICD-10-CM | POA: Diagnosis not present

## 2023-09-19 DIAGNOSIS — H35722 Serous detachment of retinal pigment epithelium, left eye: Secondary | ICD-10-CM | POA: Diagnosis not present

## 2023-09-19 DIAGNOSIS — H43391 Other vitreous opacities, right eye: Secondary | ICD-10-CM | POA: Diagnosis not present

## 2023-09-19 DIAGNOSIS — H43813 Vitreous degeneration, bilateral: Secondary | ICD-10-CM | POA: Diagnosis not present

## 2023-09-24 DIAGNOSIS — M25552 Pain in left hip: Secondary | ICD-10-CM | POA: Diagnosis not present

## 2023-09-27 ENCOUNTER — Ambulatory Visit: Payer: Medicare HMO | Admitting: Podiatry

## 2023-10-17 DIAGNOSIS — H353211 Exudative age-related macular degeneration, right eye, with active choroidal neovascularization: Secondary | ICD-10-CM | POA: Diagnosis not present

## 2023-10-17 DIAGNOSIS — H353221 Exudative age-related macular degeneration, left eye, with active choroidal neovascularization: Secondary | ICD-10-CM | POA: Diagnosis not present

## 2023-10-17 DIAGNOSIS — H43813 Vitreous degeneration, bilateral: Secondary | ICD-10-CM | POA: Diagnosis not present

## 2023-10-17 DIAGNOSIS — H35722 Serous detachment of retinal pigment epithelium, left eye: Secondary | ICD-10-CM | POA: Diagnosis not present

## 2023-10-17 DIAGNOSIS — H353132 Nonexudative age-related macular degeneration, bilateral, intermediate dry stage: Secondary | ICD-10-CM | POA: Diagnosis not present

## 2023-10-17 DIAGNOSIS — H43391 Other vitreous opacities, right eye: Secondary | ICD-10-CM | POA: Diagnosis not present

## 2023-11-01 DIAGNOSIS — M48062 Spinal stenosis, lumbar region with neurogenic claudication: Secondary | ICD-10-CM | POA: Diagnosis not present

## 2023-11-01 DIAGNOSIS — M4150 Other secondary scoliosis, site unspecified: Secondary | ICD-10-CM | POA: Diagnosis not present

## 2023-11-01 DIAGNOSIS — M5416 Radiculopathy, lumbar region: Secondary | ICD-10-CM | POA: Diagnosis not present

## 2023-11-05 ENCOUNTER — Other Ambulatory Visit: Payer: Self-pay | Admitting: Rehabilitation

## 2023-11-05 DIAGNOSIS — M25552 Pain in left hip: Secondary | ICD-10-CM | POA: Diagnosis not present

## 2023-11-05 DIAGNOSIS — M545 Low back pain, unspecified: Secondary | ICD-10-CM | POA: Diagnosis not present

## 2023-11-05 DIAGNOSIS — M48062 Spinal stenosis, lumbar region with neurogenic claudication: Secondary | ICD-10-CM

## 2023-11-13 ENCOUNTER — Encounter: Payer: Self-pay | Admitting: Podiatry

## 2023-11-13 ENCOUNTER — Ambulatory Visit (INDEPENDENT_AMBULATORY_CARE_PROVIDER_SITE_OTHER): Payer: Medicare HMO | Admitting: Podiatry

## 2023-11-13 DIAGNOSIS — M79676 Pain in unspecified toe(s): Secondary | ICD-10-CM

## 2023-11-13 DIAGNOSIS — B351 Tinea unguium: Secondary | ICD-10-CM

## 2023-11-13 NOTE — Progress Notes (Signed)
 He presents today chief complaint of painful elongated toenails states he is recently fallen and broken his hip about 2 months ago states his last A1c was 6.5.  He would like a nail trim.  Objective: Vital signs are stable alert oriented x 3.  There is no erythema edema salines drainage or odor.  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  Plan: Debridement of toenails 1 through 5 bilateral.  Follow-up with him in 6 months

## 2023-11-19 ENCOUNTER — Encounter: Payer: Self-pay | Admitting: Rehabilitation

## 2023-11-19 DIAGNOSIS — M25552 Pain in left hip: Secondary | ICD-10-CM | POA: Diagnosis not present

## 2023-11-21 ENCOUNTER — Encounter: Payer: Self-pay | Admitting: Rehabilitation

## 2023-11-22 ENCOUNTER — Encounter: Payer: Self-pay | Admitting: Rehabilitation

## 2023-11-24 ENCOUNTER — Other Ambulatory Visit: Payer: Medicare HMO

## 2023-11-27 ENCOUNTER — Ambulatory Visit
Admission: RE | Admit: 2023-11-27 | Discharge: 2023-11-27 | Discharge status: Home / Self Care | Payer: Medicare HMO | Source: Ambulatory Visit | Attending: Rehabilitation | Admitting: Rehabilitation

## 2023-11-27 DIAGNOSIS — M5126 Other intervertebral disc displacement, lumbar region: Secondary | ICD-10-CM | POA: Diagnosis not present

## 2023-11-27 DIAGNOSIS — M48062 Spinal stenosis, lumbar region with neurogenic claudication: Secondary | ICD-10-CM

## 2023-11-27 DIAGNOSIS — M47816 Spondylosis without myelopathy or radiculopathy, lumbar region: Secondary | ICD-10-CM | POA: Diagnosis not present

## 2023-11-28 DIAGNOSIS — M48062 Spinal stenosis, lumbar region with neurogenic claudication: Secondary | ICD-10-CM | POA: Diagnosis not present

## 2023-11-28 DIAGNOSIS — E538 Deficiency of other specified B group vitamins: Secondary | ICD-10-CM | POA: Diagnosis not present

## 2023-11-28 DIAGNOSIS — Z6829 Body mass index (BMI) 29.0-29.9, adult: Secondary | ICD-10-CM | POA: Diagnosis not present

## 2023-11-28 DIAGNOSIS — Z23 Encounter for immunization: Secondary | ICD-10-CM | POA: Diagnosis not present

## 2023-11-28 DIAGNOSIS — M5416 Radiculopathy, lumbar region: Secondary | ICD-10-CM | POA: Diagnosis not present

## 2023-11-28 DIAGNOSIS — M4150 Other secondary scoliosis, site unspecified: Secondary | ICD-10-CM | POA: Diagnosis not present

## 2023-11-29 DIAGNOSIS — H35722 Serous detachment of retinal pigment epithelium, left eye: Secondary | ICD-10-CM | POA: Diagnosis not present

## 2023-11-29 DIAGNOSIS — H353221 Exudative age-related macular degeneration, left eye, with active choroidal neovascularization: Secondary | ICD-10-CM | POA: Diagnosis not present

## 2023-11-29 DIAGNOSIS — H43391 Other vitreous opacities, right eye: Secondary | ICD-10-CM | POA: Diagnosis not present

## 2023-11-29 DIAGNOSIS — H353132 Nonexudative age-related macular degeneration, bilateral, intermediate dry stage: Secondary | ICD-10-CM | POA: Diagnosis not present

## 2023-11-29 DIAGNOSIS — H43813 Vitreous degeneration, bilateral: Secondary | ICD-10-CM | POA: Diagnosis not present

## 2023-11-29 DIAGNOSIS — H353211 Exudative age-related macular degeneration, right eye, with active choroidal neovascularization: Secondary | ICD-10-CM | POA: Diagnosis not present

## 2023-12-28 DIAGNOSIS — M48062 Spinal stenosis, lumbar region with neurogenic claudication: Secondary | ICD-10-CM | POA: Diagnosis not present

## 2023-12-28 DIAGNOSIS — M5416 Radiculopathy, lumbar region: Secondary | ICD-10-CM | POA: Diagnosis not present

## 2024-01-07 DIAGNOSIS — H35722 Serous detachment of retinal pigment epithelium, left eye: Secondary | ICD-10-CM | POA: Diagnosis not present

## 2024-01-07 DIAGNOSIS — H43391 Other vitreous opacities, right eye: Secondary | ICD-10-CM | POA: Diagnosis not present

## 2024-01-07 DIAGNOSIS — H43813 Vitreous degeneration, bilateral: Secondary | ICD-10-CM | POA: Diagnosis not present

## 2024-01-07 DIAGNOSIS — H353211 Exudative age-related macular degeneration, right eye, with active choroidal neovascularization: Secondary | ICD-10-CM | POA: Diagnosis not present

## 2024-01-07 DIAGNOSIS — H353221 Exudative age-related macular degeneration, left eye, with active choroidal neovascularization: Secondary | ICD-10-CM | POA: Diagnosis not present

## 2024-01-07 DIAGNOSIS — H353132 Nonexudative age-related macular degeneration, bilateral, intermediate dry stage: Secondary | ICD-10-CM | POA: Diagnosis not present

## 2024-01-10 DIAGNOSIS — Z01810 Encounter for preprocedural cardiovascular examination: Secondary | ICD-10-CM | POA: Diagnosis not present

## 2024-01-14 DIAGNOSIS — M48062 Spinal stenosis, lumbar region with neurogenic claudication: Secondary | ICD-10-CM | POA: Diagnosis not present

## 2024-01-14 DIAGNOSIS — M419 Scoliosis, unspecified: Secondary | ICD-10-CM | POA: Diagnosis not present

## 2024-01-14 DIAGNOSIS — Z981 Arthrodesis status: Secondary | ICD-10-CM | POA: Diagnosis not present

## 2024-01-14 DIAGNOSIS — M5416 Radiculopathy, lumbar region: Secondary | ICD-10-CM | POA: Diagnosis not present

## 2024-01-14 DIAGNOSIS — M4316 Spondylolisthesis, lumbar region: Secondary | ICD-10-CM | POA: Diagnosis not present

## 2024-01-14 DIAGNOSIS — M4726 Other spondylosis with radiculopathy, lumbar region: Secondary | ICD-10-CM | POA: Diagnosis not present

## 2024-01-15 DIAGNOSIS — I700 Atherosclerosis of aorta: Secondary | ICD-10-CM | POA: Diagnosis not present

## 2024-01-15 DIAGNOSIS — Z981 Arthrodesis status: Secondary | ICD-10-CM | POA: Diagnosis not present

## 2024-02-13 DIAGNOSIS — M48062 Spinal stenosis, lumbar region with neurogenic claudication: Secondary | ICD-10-CM | POA: Diagnosis not present

## 2024-02-13 DIAGNOSIS — M5416 Radiculopathy, lumbar region: Secondary | ICD-10-CM | POA: Diagnosis not present

## 2024-02-28 DIAGNOSIS — E538 Deficiency of other specified B group vitamins: Secondary | ICD-10-CM | POA: Diagnosis not present

## 2024-04-08 ENCOUNTER — Encounter: Payer: Self-pay | Admitting: Podiatry

## 2024-04-08 ENCOUNTER — Ambulatory Visit (INDEPENDENT_AMBULATORY_CARE_PROVIDER_SITE_OTHER): Admitting: Podiatry

## 2024-04-08 DIAGNOSIS — E1142 Type 2 diabetes mellitus with diabetic polyneuropathy: Secondary | ICD-10-CM | POA: Diagnosis not present

## 2024-04-08 DIAGNOSIS — B351 Tinea unguium: Secondary | ICD-10-CM

## 2024-04-08 DIAGNOSIS — M79676 Pain in unspecified toe(s): Secondary | ICD-10-CM | POA: Diagnosis not present

## 2024-04-08 NOTE — Progress Notes (Signed)
 He presents today chief complaint of painful elongated toenails.  He is recently had back surgery and left hip surgery about 5 weeks ago.  States his back surgery still not helping him.  Objective: Vital signs are stable alert oriented x 3.  Pulses are palpable.  Toenails are long thick yellow dystrophic with mycotic sharply incurvated tender on palpation.  Assessment: Pain in limb secondary to onychomycosis.  Plan: Debridement of nails 1 through 5 bilateral problems with secondary pain.

## 2024-04-16 DIAGNOSIS — Z1330 Encounter for screening examination for mental health and behavioral disorders, unspecified: Secondary | ICD-10-CM | POA: Diagnosis not present

## 2024-04-16 DIAGNOSIS — M4326 Fusion of spine, lumbar region: Secondary | ICD-10-CM | POA: Diagnosis not present

## 2024-04-16 DIAGNOSIS — M47816 Spondylosis without myelopathy or radiculopathy, lumbar region: Secondary | ICD-10-CM | POA: Diagnosis not present

## 2024-04-24 DIAGNOSIS — M4326 Fusion of spine, lumbar region: Secondary | ICD-10-CM | POA: Diagnosis not present

## 2024-04-24 DIAGNOSIS — M6281 Muscle weakness (generalized): Secondary | ICD-10-CM | POA: Diagnosis not present

## 2024-04-24 DIAGNOSIS — Z7409 Other reduced mobility: Secondary | ICD-10-CM | POA: Diagnosis not present

## 2024-04-24 DIAGNOSIS — R262 Difficulty in walking, not elsewhere classified: Secondary | ICD-10-CM | POA: Diagnosis not present

## 2024-04-28 DIAGNOSIS — M6281 Muscle weakness (generalized): Secondary | ICD-10-CM | POA: Diagnosis not present

## 2024-04-28 DIAGNOSIS — R262 Difficulty in walking, not elsewhere classified: Secondary | ICD-10-CM | POA: Diagnosis not present

## 2024-04-28 DIAGNOSIS — Z7409 Other reduced mobility: Secondary | ICD-10-CM | POA: Diagnosis not present

## 2024-04-28 DIAGNOSIS — M4326 Fusion of spine, lumbar region: Secondary | ICD-10-CM | POA: Diagnosis not present

## 2024-05-01 DIAGNOSIS — Z7409 Other reduced mobility: Secondary | ICD-10-CM | POA: Diagnosis not present

## 2024-05-01 DIAGNOSIS — M6281 Muscle weakness (generalized): Secondary | ICD-10-CM | POA: Diagnosis not present

## 2024-05-01 DIAGNOSIS — M4326 Fusion of spine, lumbar region: Secondary | ICD-10-CM | POA: Diagnosis not present

## 2024-05-01 DIAGNOSIS — R262 Difficulty in walking, not elsewhere classified: Secondary | ICD-10-CM | POA: Diagnosis not present

## 2024-05-05 DIAGNOSIS — R262 Difficulty in walking, not elsewhere classified: Secondary | ICD-10-CM | POA: Diagnosis not present

## 2024-05-05 DIAGNOSIS — Z7409 Other reduced mobility: Secondary | ICD-10-CM | POA: Diagnosis not present

## 2024-05-05 DIAGNOSIS — M6281 Muscle weakness (generalized): Secondary | ICD-10-CM | POA: Diagnosis not present

## 2024-05-05 DIAGNOSIS — M4326 Fusion of spine, lumbar region: Secondary | ICD-10-CM | POA: Diagnosis not present

## 2024-05-08 DIAGNOSIS — R262 Difficulty in walking, not elsewhere classified: Secondary | ICD-10-CM | POA: Diagnosis not present

## 2024-05-08 DIAGNOSIS — M4326 Fusion of spine, lumbar region: Secondary | ICD-10-CM | POA: Diagnosis not present

## 2024-05-08 DIAGNOSIS — Z7409 Other reduced mobility: Secondary | ICD-10-CM | POA: Diagnosis not present

## 2024-05-08 DIAGNOSIS — M6281 Muscle weakness (generalized): Secondary | ICD-10-CM | POA: Diagnosis not present

## 2024-05-12 DIAGNOSIS — M4326 Fusion of spine, lumbar region: Secondary | ICD-10-CM | POA: Diagnosis not present

## 2024-05-12 DIAGNOSIS — Z7409 Other reduced mobility: Secondary | ICD-10-CM | POA: Diagnosis not present

## 2024-05-12 DIAGNOSIS — R262 Difficulty in walking, not elsewhere classified: Secondary | ICD-10-CM | POA: Diagnosis not present

## 2024-05-12 DIAGNOSIS — M6281 Muscle weakness (generalized): Secondary | ICD-10-CM | POA: Diagnosis not present

## 2024-05-13 ENCOUNTER — Encounter: Payer: Self-pay | Admitting: Podiatry

## 2024-05-13 ENCOUNTER — Ambulatory Visit: Payer: Medicare HMO | Admitting: Podiatry

## 2024-05-13 ENCOUNTER — Ambulatory Visit (INDEPENDENT_AMBULATORY_CARE_PROVIDER_SITE_OTHER): Admitting: Podiatry

## 2024-05-13 DIAGNOSIS — L600 Ingrowing nail: Secondary | ICD-10-CM

## 2024-05-13 NOTE — Progress Notes (Signed)
 This patient presents to the office saying he has ingrown toenails on his second and third toe left foot.  He says these become painful walking and wearing his shoes.  He has history of DM neuropathy and CKD.  He presents to the office for evaluation and treatment.  General Appearance  Alert, conversant and in no acute stress.  Vascular  Dorsalis pedis and posterior tibial  pulses are palpable  bilaterally.  Capillary return is within normal limits  bilaterally. Temperature is within normal limits  bilaterally.  Neurologic  Senn-Weinstein monofilament wire test diminished  bilaterally. Muscle power within normal limits bilaterally.  Nails Thick disfigured discolored nails with subungual debris  from hallux to fifth toes bilaterally. No evidence of bacterial infection or drainage bilaterally. Ingrown toenails 2,3 left foot.  Orthopedic  No limitations of motion  feet .  No crepitus or effusions noted.  No bony pathology or digital deformities noted.  Skin  normotropic skin with no porokeratosis noted bilaterally.  No signs of infections or ulcers noted.    Ingrown toenails left foot.  ROV.  Discussed nail with this patient.  RTC prn  Cordella Bold DPM

## 2024-05-14 DIAGNOSIS — M6281 Muscle weakness (generalized): Secondary | ICD-10-CM | POA: Diagnosis not present

## 2024-05-14 DIAGNOSIS — R262 Difficulty in walking, not elsewhere classified: Secondary | ICD-10-CM | POA: Diagnosis not present

## 2024-05-14 DIAGNOSIS — M4326 Fusion of spine, lumbar region: Secondary | ICD-10-CM | POA: Diagnosis not present

## 2024-05-14 DIAGNOSIS — Z7409 Other reduced mobility: Secondary | ICD-10-CM | POA: Diagnosis not present

## 2024-05-19 DIAGNOSIS — R262 Difficulty in walking, not elsewhere classified: Secondary | ICD-10-CM | POA: Diagnosis not present

## 2024-05-19 DIAGNOSIS — M4326 Fusion of spine, lumbar region: Secondary | ICD-10-CM | POA: Diagnosis not present

## 2024-05-19 DIAGNOSIS — M6281 Muscle weakness (generalized): Secondary | ICD-10-CM | POA: Diagnosis not present

## 2024-05-19 DIAGNOSIS — Z7409 Other reduced mobility: Secondary | ICD-10-CM | POA: Diagnosis not present

## 2024-05-22 DIAGNOSIS — Z7409 Other reduced mobility: Secondary | ICD-10-CM | POA: Diagnosis not present

## 2024-05-22 DIAGNOSIS — R262 Difficulty in walking, not elsewhere classified: Secondary | ICD-10-CM | POA: Diagnosis not present

## 2024-05-22 DIAGNOSIS — M4326 Fusion of spine, lumbar region: Secondary | ICD-10-CM | POA: Diagnosis not present

## 2024-05-22 DIAGNOSIS — M6281 Muscle weakness (generalized): Secondary | ICD-10-CM | POA: Diagnosis not present

## 2024-05-27 DIAGNOSIS — Z7409 Other reduced mobility: Secondary | ICD-10-CM | POA: Diagnosis not present

## 2024-05-27 DIAGNOSIS — M6281 Muscle weakness (generalized): Secondary | ICD-10-CM | POA: Diagnosis not present

## 2024-05-27 DIAGNOSIS — R262 Difficulty in walking, not elsewhere classified: Secondary | ICD-10-CM | POA: Diagnosis not present

## 2024-05-27 DIAGNOSIS — M4326 Fusion of spine, lumbar region: Secondary | ICD-10-CM | POA: Diagnosis not present

## 2024-05-29 DIAGNOSIS — Z7409 Other reduced mobility: Secondary | ICD-10-CM | POA: Diagnosis not present

## 2024-05-29 DIAGNOSIS — R262 Difficulty in walking, not elsewhere classified: Secondary | ICD-10-CM | POA: Diagnosis not present

## 2024-05-29 DIAGNOSIS — M6281 Muscle weakness (generalized): Secondary | ICD-10-CM | POA: Diagnosis not present

## 2024-05-29 DIAGNOSIS — M4326 Fusion of spine, lumbar region: Secondary | ICD-10-CM | POA: Diagnosis not present

## 2024-06-03 DIAGNOSIS — R262 Difficulty in walking, not elsewhere classified: Secondary | ICD-10-CM | POA: Diagnosis not present

## 2024-06-03 DIAGNOSIS — M6281 Muscle weakness (generalized): Secondary | ICD-10-CM | POA: Diagnosis not present

## 2024-06-03 DIAGNOSIS — M4326 Fusion of spine, lumbar region: Secondary | ICD-10-CM | POA: Diagnosis not present

## 2024-06-03 DIAGNOSIS — Z7409 Other reduced mobility: Secondary | ICD-10-CM | POA: Diagnosis not present

## 2024-06-05 DIAGNOSIS — M4326 Fusion of spine, lumbar region: Secondary | ICD-10-CM | POA: Diagnosis not present

## 2024-06-05 DIAGNOSIS — Z7409 Other reduced mobility: Secondary | ICD-10-CM | POA: Diagnosis not present

## 2024-06-05 DIAGNOSIS — R262 Difficulty in walking, not elsewhere classified: Secondary | ICD-10-CM | POA: Diagnosis not present

## 2024-06-05 DIAGNOSIS — M6281 Muscle weakness (generalized): Secondary | ICD-10-CM | POA: Diagnosis not present

## 2024-06-10 DIAGNOSIS — R262 Difficulty in walking, not elsewhere classified: Secondary | ICD-10-CM | POA: Diagnosis not present

## 2024-06-10 DIAGNOSIS — Z7409 Other reduced mobility: Secondary | ICD-10-CM | POA: Diagnosis not present

## 2024-06-10 DIAGNOSIS — M4326 Fusion of spine, lumbar region: Secondary | ICD-10-CM | POA: Diagnosis not present

## 2024-06-10 DIAGNOSIS — M6281 Muscle weakness (generalized): Secondary | ICD-10-CM | POA: Diagnosis not present

## 2024-06-12 DIAGNOSIS — Z7409 Other reduced mobility: Secondary | ICD-10-CM | POA: Diagnosis not present

## 2024-06-12 DIAGNOSIS — M6281 Muscle weakness (generalized): Secondary | ICD-10-CM | POA: Diagnosis not present

## 2024-06-12 DIAGNOSIS — R262 Difficulty in walking, not elsewhere classified: Secondary | ICD-10-CM | POA: Diagnosis not present

## 2024-06-12 DIAGNOSIS — M4326 Fusion of spine, lumbar region: Secondary | ICD-10-CM | POA: Diagnosis not present

## 2024-06-17 DIAGNOSIS — M6281 Muscle weakness (generalized): Secondary | ICD-10-CM | POA: Diagnosis not present

## 2024-06-17 DIAGNOSIS — Z7409 Other reduced mobility: Secondary | ICD-10-CM | POA: Diagnosis not present

## 2024-06-17 DIAGNOSIS — R262 Difficulty in walking, not elsewhere classified: Secondary | ICD-10-CM | POA: Diagnosis not present

## 2024-06-17 DIAGNOSIS — M4326 Fusion of spine, lumbar region: Secondary | ICD-10-CM | POA: Diagnosis not present

## 2024-06-19 DIAGNOSIS — M4326 Fusion of spine, lumbar region: Secondary | ICD-10-CM | POA: Diagnosis not present

## 2024-06-19 DIAGNOSIS — M47816 Spondylosis without myelopathy or radiculopathy, lumbar region: Secondary | ICD-10-CM | POA: Diagnosis not present

## 2024-06-26 DIAGNOSIS — M4326 Fusion of spine, lumbar region: Secondary | ICD-10-CM | POA: Diagnosis not present

## 2024-06-26 DIAGNOSIS — Z7409 Other reduced mobility: Secondary | ICD-10-CM | POA: Diagnosis not present

## 2024-06-26 DIAGNOSIS — R262 Difficulty in walking, not elsewhere classified: Secondary | ICD-10-CM | POA: Diagnosis not present

## 2024-06-26 DIAGNOSIS — M6281 Muscle weakness (generalized): Secondary | ICD-10-CM | POA: Diagnosis not present

## 2024-07-01 DIAGNOSIS — M6281 Muscle weakness (generalized): Secondary | ICD-10-CM | POA: Diagnosis not present

## 2024-07-01 DIAGNOSIS — M4326 Fusion of spine, lumbar region: Secondary | ICD-10-CM | POA: Diagnosis not present

## 2024-07-01 DIAGNOSIS — R262 Difficulty in walking, not elsewhere classified: Secondary | ICD-10-CM | POA: Diagnosis not present

## 2024-07-01 DIAGNOSIS — Z7409 Other reduced mobility: Secondary | ICD-10-CM | POA: Diagnosis not present

## 2024-07-03 DIAGNOSIS — Z7409 Other reduced mobility: Secondary | ICD-10-CM | POA: Diagnosis not present

## 2024-07-03 DIAGNOSIS — M6281 Muscle weakness (generalized): Secondary | ICD-10-CM | POA: Diagnosis not present

## 2024-07-03 DIAGNOSIS — R262 Difficulty in walking, not elsewhere classified: Secondary | ICD-10-CM | POA: Diagnosis not present

## 2024-07-03 DIAGNOSIS — M4326 Fusion of spine, lumbar region: Secondary | ICD-10-CM | POA: Diagnosis not present

## 2024-07-08 DIAGNOSIS — M6281 Muscle weakness (generalized): Secondary | ICD-10-CM | POA: Diagnosis not present

## 2024-07-08 DIAGNOSIS — R262 Difficulty in walking, not elsewhere classified: Secondary | ICD-10-CM | POA: Diagnosis not present

## 2024-07-08 DIAGNOSIS — Z7409 Other reduced mobility: Secondary | ICD-10-CM | POA: Diagnosis not present

## 2024-07-08 DIAGNOSIS — M4326 Fusion of spine, lumbar region: Secondary | ICD-10-CM | POA: Diagnosis not present

## 2024-07-10 DIAGNOSIS — M4326 Fusion of spine, lumbar region: Secondary | ICD-10-CM | POA: Diagnosis not present

## 2024-07-10 DIAGNOSIS — M6281 Muscle weakness (generalized): Secondary | ICD-10-CM | POA: Diagnosis not present

## 2024-07-10 DIAGNOSIS — Z7409 Other reduced mobility: Secondary | ICD-10-CM | POA: Diagnosis not present

## 2024-07-10 DIAGNOSIS — R262 Difficulty in walking, not elsewhere classified: Secondary | ICD-10-CM | POA: Diagnosis not present

## 2024-07-15 DIAGNOSIS — R262 Difficulty in walking, not elsewhere classified: Secondary | ICD-10-CM | POA: Diagnosis not present

## 2024-07-15 DIAGNOSIS — M4326 Fusion of spine, lumbar region: Secondary | ICD-10-CM | POA: Diagnosis not present

## 2024-07-15 DIAGNOSIS — M6281 Muscle weakness (generalized): Secondary | ICD-10-CM | POA: Diagnosis not present

## 2024-07-15 DIAGNOSIS — Z7409 Other reduced mobility: Secondary | ICD-10-CM | POA: Diagnosis not present

## 2024-07-17 DIAGNOSIS — M4326 Fusion of spine, lumbar region: Secondary | ICD-10-CM | POA: Diagnosis not present

## 2024-07-17 DIAGNOSIS — M6281 Muscle weakness (generalized): Secondary | ICD-10-CM | POA: Diagnosis not present

## 2024-07-17 DIAGNOSIS — R262 Difficulty in walking, not elsewhere classified: Secondary | ICD-10-CM | POA: Diagnosis not present

## 2024-07-17 DIAGNOSIS — Z7409 Other reduced mobility: Secondary | ICD-10-CM | POA: Diagnosis not present

## 2024-07-22 DIAGNOSIS — Z23 Encounter for immunization: Secondary | ICD-10-CM | POA: Diagnosis not present

## 2024-07-22 DIAGNOSIS — E538 Deficiency of other specified B group vitamins: Secondary | ICD-10-CM | POA: Diagnosis not present

## 2024-08-12 ENCOUNTER — Ambulatory Visit (INDEPENDENT_AMBULATORY_CARE_PROVIDER_SITE_OTHER): Admitting: Podiatry

## 2024-08-12 ENCOUNTER — Encounter: Payer: Self-pay | Admitting: Podiatry

## 2024-08-12 DIAGNOSIS — M79676 Pain in unspecified toe(s): Secondary | ICD-10-CM

## 2024-08-12 DIAGNOSIS — E1142 Type 2 diabetes mellitus with diabetic polyneuropathy: Secondary | ICD-10-CM

## 2024-08-12 DIAGNOSIS — B351 Tinea unguium: Secondary | ICD-10-CM

## 2024-08-12 DIAGNOSIS — M47816 Spondylosis without myelopathy or radiculopathy, lumbar region: Secondary | ICD-10-CM | POA: Diagnosis not present

## 2024-08-12 DIAGNOSIS — M4326 Fusion of spine, lumbar region: Secondary | ICD-10-CM | POA: Diagnosis not present

## 2024-08-12 NOTE — Progress Notes (Signed)
 He presents today chief complaint of painful elongated toenails.  He is recently had back surgery and left hip surgery about 5 weeks ago.  States his back surgery still not helping him.  Objective: Vital signs are stable alert oriented x 3.  Pulses are palpable.  Toenails are long thick yellow dystrophic with mycotic sharply incurvated tender on palpation.  Assessment: Pain in limb secondary to onychomycosis.  Plan: Debridement of nails 1 through 5 bilateral problems with secondary pain.

## 2024-08-15 DIAGNOSIS — Z981 Arthrodesis status: Secondary | ICD-10-CM | POA: Diagnosis not present

## 2024-08-15 DIAGNOSIS — M47816 Spondylosis without myelopathy or radiculopathy, lumbar region: Secondary | ICD-10-CM | POA: Diagnosis not present

## 2024-08-15 DIAGNOSIS — M47817 Spondylosis without myelopathy or radiculopathy, lumbosacral region: Secondary | ICD-10-CM | POA: Diagnosis not present

## 2024-09-17 DIAGNOSIS — M4326 Fusion of spine, lumbar region: Secondary | ICD-10-CM | POA: Diagnosis not present

## 2024-09-17 DIAGNOSIS — M47816 Spondylosis without myelopathy or radiculopathy, lumbar region: Secondary | ICD-10-CM | POA: Diagnosis not present

## 2024-09-29 DIAGNOSIS — M47816 Spondylosis without myelopathy or radiculopathy, lumbar region: Secondary | ICD-10-CM | POA: Diagnosis not present

## 2024-09-29 DIAGNOSIS — M4326 Fusion of spine, lumbar region: Secondary | ICD-10-CM | POA: Diagnosis not present

## 2024-11-11 ENCOUNTER — Ambulatory Visit: Admitting: Podiatry

## 2024-11-25 ENCOUNTER — Encounter: Payer: Self-pay | Admitting: Podiatry

## 2024-11-25 ENCOUNTER — Ambulatory Visit (INDEPENDENT_AMBULATORY_CARE_PROVIDER_SITE_OTHER): Admitting: Podiatry

## 2024-11-25 DIAGNOSIS — E1142 Type 2 diabetes mellitus with diabetic polyneuropathy: Secondary | ICD-10-CM

## 2024-11-25 DIAGNOSIS — M79676 Pain in unspecified toe(s): Secondary | ICD-10-CM | POA: Diagnosis not present

## 2024-11-25 DIAGNOSIS — B351 Tinea unguium: Secondary | ICD-10-CM

## 2024-11-25 NOTE — Progress Notes (Signed)
 He presents today chief complaint of painful elongated toenails.  Objective: Pulses are palpable.  No open lesions or wounds.  Toenails are long thick yellow dystrophic with mycotic.  Assessment: Pain in limb secondary to onychomycosis.  Plan: Debridement of toenails 1 through 5 bilateral.

## 2025-02-24 ENCOUNTER — Ambulatory Visit: Admitting: Podiatry
